# Patient Record
Sex: Female | Born: 1935 | Race: Black or African American | Hispanic: No | Smoking: Never smoker
Health system: Southern US, Community
[De-identification: ages and names within clinical notes are randomized; demographics above are authoritative.]

## PROBLEM LIST (undated history)

## (undated) DIAGNOSIS — K5790 Diverticulosis of intestine, part unspecified, without perforation or abscess without bleeding: Secondary | ICD-10-CM

## (undated) DIAGNOSIS — I872 Venous insufficiency (chronic) (peripheral): Secondary | ICD-10-CM

## (undated) DIAGNOSIS — C50919 Malignant neoplasm of unspecified site of unspecified female breast: Secondary | ICD-10-CM

## (undated) DIAGNOSIS — R51 Headache: Secondary | ICD-10-CM

## (undated) DIAGNOSIS — K589 Irritable bowel syndrome without diarrhea: Secondary | ICD-10-CM

## (undated) DIAGNOSIS — M1990 Unspecified osteoarthritis, unspecified site: Secondary | ICD-10-CM

## (undated) DIAGNOSIS — I10 Essential (primary) hypertension: Secondary | ICD-10-CM

## (undated) DIAGNOSIS — D126 Benign neoplasm of colon, unspecified: Secondary | ICD-10-CM

## (undated) DIAGNOSIS — R0902 Hypoxemia: Secondary | ICD-10-CM

## (undated) DIAGNOSIS — F419 Anxiety disorder, unspecified: Secondary | ICD-10-CM

## (undated) DIAGNOSIS — I4821 Permanent atrial fibrillation: Secondary | ICD-10-CM

## (undated) DIAGNOSIS — R7303 Prediabetes: Secondary | ICD-10-CM

## (undated) HISTORY — DX: Irritable bowel syndrome, unspecified: K58.9

## (undated) HISTORY — DX: Unspecified osteoarthritis, unspecified site: M19.90

## (undated) HISTORY — DX: Hypoxemia: R09.02

## (undated) HISTORY — PX: OTHER SURGICAL HISTORY: SHX169

## (undated) HISTORY — DX: Headache: R51

## (undated) HISTORY — DX: Malignant neoplasm of unspecified site of unspecified female breast: C50.919

## (undated) HISTORY — DX: Prediabetes: R73.03

## (undated) HISTORY — DX: Benign neoplasm of colon, unspecified: D12.6

## (undated) HISTORY — DX: Diverticulosis of intestine, part unspecified, without perforation or abscess without bleeding: K57.90

## (undated) HISTORY — PX: CATARACT EXTRACTION: SUR2

## (undated) HISTORY — PX: BREAST SURGERY: SHX581

## (undated) HISTORY — DX: Permanent atrial fibrillation: I48.21

## (undated) HISTORY — DX: Essential (primary) hypertension: I10

## (undated) HISTORY — DX: Anxiety disorder, unspecified: F41.9

## (undated) HISTORY — DX: Venous insufficiency (chronic) (peripheral): I87.2

## (undated) HISTORY — PX: CHOLECYSTECTOMY: SHX55

---

## 1994-06-11 HISTORY — PX: MASTECTOMY: SHX3

## 1999-05-10 ENCOUNTER — Encounter: Payer: Self-pay | Admitting: Emergency Medicine

## 1999-05-10 ENCOUNTER — Emergency Department (HOSPITAL_COMMUNITY): Admission: EM | Admit: 1999-05-10 | Discharge: 1999-05-10 | Payer: Self-pay | Admitting: Emergency Medicine

## 2000-05-31 ENCOUNTER — Other Ambulatory Visit: Admission: RE | Admit: 2000-05-31 | Discharge: 2000-05-31 | Payer: Self-pay | Admitting: Surgery

## 2000-06-11 HISTORY — PX: MASTECTOMY: SHX3

## 2000-06-12 ENCOUNTER — Encounter: Admission: RE | Admit: 2000-06-12 | Discharge: 2000-06-12 | Payer: Self-pay | Admitting: Surgery

## 2000-06-12 ENCOUNTER — Encounter: Payer: Self-pay | Admitting: Surgery

## 2000-06-18 ENCOUNTER — Encounter: Payer: Self-pay | Admitting: Surgery

## 2000-06-20 ENCOUNTER — Encounter: Payer: Self-pay | Admitting: Surgery

## 2000-06-20 ENCOUNTER — Inpatient Hospital Stay (HOSPITAL_COMMUNITY): Admission: RE | Admit: 2000-06-20 | Discharge: 2000-06-22 | Payer: Self-pay | Admitting: Surgery

## 2000-06-20 ENCOUNTER — Encounter (INDEPENDENT_AMBULATORY_CARE_PROVIDER_SITE_OTHER): Payer: Self-pay | Admitting: *Deleted

## 2002-06-11 HISTORY — PX: KNEE SURGERY: SHX244

## 2002-10-21 ENCOUNTER — Encounter: Payer: Self-pay | Admitting: Orthopedic Surgery

## 2002-10-26 ENCOUNTER — Inpatient Hospital Stay (HOSPITAL_COMMUNITY): Admission: RE | Admit: 2002-10-26 | Discharge: 2002-10-29 | Payer: Self-pay | Admitting: Orthopedic Surgery

## 2002-10-29 ENCOUNTER — Inpatient Hospital Stay (HOSPITAL_COMMUNITY)
Admission: RE | Admit: 2002-10-29 | Discharge: 2002-11-06 | Payer: Self-pay | Admitting: Physical Medicine & Rehabilitation

## 2002-10-29 ENCOUNTER — Encounter: Payer: Self-pay | Admitting: Physical Medicine & Rehabilitation

## 2002-11-02 ENCOUNTER — Encounter: Payer: Self-pay | Admitting: Physical Medicine & Rehabilitation

## 2002-11-10 HISTORY — PX: TOTAL KNEE ARTHROPLASTY: SHX125

## 2002-12-02 ENCOUNTER — Encounter: Admission: RE | Admit: 2002-12-02 | Discharge: 2003-02-02 | Payer: Self-pay | Admitting: Orthopedic Surgery

## 2004-04-24 ENCOUNTER — Ambulatory Visit: Payer: Self-pay

## 2004-06-11 DIAGNOSIS — D126 Benign neoplasm of colon, unspecified: Secondary | ICD-10-CM

## 2004-06-11 HISTORY — DX: Benign neoplasm of colon, unspecified: D12.6

## 2004-08-01 ENCOUNTER — Ambulatory Visit: Payer: Self-pay | Admitting: Pulmonary Disease

## 2004-11-28 ENCOUNTER — Ambulatory Visit: Payer: Self-pay | Admitting: Pulmonary Disease

## 2005-03-27 ENCOUNTER — Ambulatory Visit: Payer: Self-pay | Admitting: Pulmonary Disease

## 2005-04-05 ENCOUNTER — Ambulatory Visit: Payer: Self-pay | Admitting: Gastroenterology

## 2005-04-27 ENCOUNTER — Encounter (INDEPENDENT_AMBULATORY_CARE_PROVIDER_SITE_OTHER): Payer: Self-pay | Admitting: *Deleted

## 2005-04-27 ENCOUNTER — Ambulatory Visit: Payer: Self-pay | Admitting: Gastroenterology

## 2005-07-31 ENCOUNTER — Ambulatory Visit: Payer: Self-pay | Admitting: Pulmonary Disease

## 2005-08-28 ENCOUNTER — Ambulatory Visit: Payer: Self-pay | Admitting: Pulmonary Disease

## 2006-01-28 ENCOUNTER — Ambulatory Visit: Payer: Self-pay | Admitting: Pulmonary Disease

## 2006-04-10 ENCOUNTER — Ambulatory Visit: Payer: Self-pay | Admitting: Pulmonary Disease

## 2006-04-16 ENCOUNTER — Ambulatory Visit: Payer: Self-pay | Admitting: Pulmonary Disease

## 2006-04-23 ENCOUNTER — Ambulatory Visit: Payer: Self-pay | Admitting: Pulmonary Disease

## 2006-05-20 ENCOUNTER — Ambulatory Visit: Payer: Self-pay | Admitting: Pulmonary Disease

## 2006-08-19 ENCOUNTER — Ambulatory Visit: Payer: Self-pay | Admitting: Pulmonary Disease

## 2006-09-09 ENCOUNTER — Ambulatory Visit: Payer: Self-pay | Admitting: Pulmonary Disease

## 2006-12-10 ENCOUNTER — Ambulatory Visit: Payer: Self-pay | Admitting: Pulmonary Disease

## 2006-12-10 LAB — CONVERTED CEMR LAB
ALT: 13 units/L (ref 0–35)
Albumin: 3.7 g/dL (ref 3.5–5.2)
Alkaline Phosphatase: 71 units/L (ref 39–117)
Basophils Absolute: 0.3 10*3/uL — ABNORMAL HIGH (ref 0.0–0.1)
Basophils Relative: 3.1 % — ABNORMAL HIGH (ref 0.0–1.0)
CO2: 41 meq/L — ABNORMAL HIGH (ref 19–32)
Eosinophils Absolute: 0.3 10*3/uL (ref 0.0–0.6)
GFR calc Af Amer: 89 mL/min
HCT: 41.1 % (ref 36.0–46.0)
Hgb A1c MFr Bld: 7.1 % — ABNORMAL HIGH (ref 4.6–6.0)
Lymphocytes Relative: 16.9 % (ref 12.0–46.0)
MCHC: 32.9 g/dL (ref 30.0–36.0)
MCV: 90.6 fL (ref 78.0–100.0)
Monocytes Absolute: 0.8 10*3/uL — ABNORMAL HIGH (ref 0.2–0.7)
Monocytes Relative: 7.8 % (ref 3.0–11.0)
Neutro Abs: 7 10*3/uL (ref 1.4–7.7)
Platelets: 210 10*3/uL (ref 150–400)
RBC: 4.54 M/uL (ref 3.87–5.11)
Total CHOL/HDL Ratio: 3.8
Total Protein: 7.4 g/dL (ref 6.0–8.3)
VLDL: 24 mg/dL (ref 0–40)
WBC: 10.1 10*3/uL (ref 4.5–10.5)

## 2006-12-20 ENCOUNTER — Ambulatory Visit: Payer: Self-pay | Admitting: Pulmonary Disease

## 2006-12-21 ENCOUNTER — Ambulatory Visit (HOSPITAL_COMMUNITY): Admission: RE | Admit: 2006-12-21 | Discharge: 2006-12-21 | Payer: Self-pay | Admitting: Pulmonary Disease

## 2006-12-30 ENCOUNTER — Ambulatory Visit: Payer: Self-pay

## 2006-12-30 ENCOUNTER — Encounter: Payer: Self-pay | Admitting: Pulmonary Disease

## 2007-01-16 ENCOUNTER — Ambulatory Visit: Payer: Self-pay | Admitting: Pulmonary Disease

## 2007-01-16 LAB — CONVERTED CEMR LAB
CO2: 39 meq/L — ABNORMAL HIGH (ref 19–32)
Calcium: 9.4 mg/dL (ref 8.4–10.5)
Chloride: 96 meq/L (ref 96–112)
GFR calc Af Amer: 104 mL/min
GFR calc non Af Amer: 86 mL/min
Potassium: 3.2 meq/L — ABNORMAL LOW (ref 3.5–5.1)
Pro B Natriuretic peptide (BNP): 177 pg/mL — ABNORMAL HIGH (ref 0.0–100.0)
Sed Rate: 29 mm/hr — ABNORMAL HIGH (ref 0–25)
Sodium: 142 meq/L (ref 135–145)

## 2007-01-20 ENCOUNTER — Ambulatory Visit: Payer: Self-pay | Admitting: Pulmonary Disease

## 2007-02-19 ENCOUNTER — Ambulatory Visit: Payer: Self-pay | Admitting: Pulmonary Disease

## 2007-02-19 LAB — CONVERTED CEMR LAB
ALT: 15 units/L (ref 0–35)
BUN: 19 mg/dL (ref 6–23)
Bilirubin, Direct: 0.1 mg/dL (ref 0.0–0.3)
CO2: 35 meq/L — ABNORMAL HIGH (ref 19–32)
Calcium: 9.6 mg/dL (ref 8.4–10.5)
Creatinine, Ser: 1.1 mg/dL (ref 0.4–1.2)
GFR calc Af Amer: 62 mL/min
GFR calc non Af Amer: 51 mL/min
Glucose, Bld: 153 mg/dL — ABNORMAL HIGH (ref 70–99)
Total Protein: 6.7 g/dL (ref 6.0–8.3)

## 2007-04-08 DIAGNOSIS — K589 Irritable bowel syndrome without diarrhea: Secondary | ICD-10-CM | POA: Insufficient documentation

## 2007-04-08 DIAGNOSIS — I10 Essential (primary) hypertension: Secondary | ICD-10-CM | POA: Insufficient documentation

## 2007-04-08 DIAGNOSIS — M1990 Unspecified osteoarthritis, unspecified site: Secondary | ICD-10-CM | POA: Insufficient documentation

## 2007-04-08 DIAGNOSIS — R51 Headache: Secondary | ICD-10-CM

## 2007-04-08 DIAGNOSIS — F411 Generalized anxiety disorder: Secondary | ICD-10-CM

## 2007-04-08 DIAGNOSIS — I872 Venous insufficiency (chronic) (peripheral): Secondary | ICD-10-CM

## 2007-04-08 DIAGNOSIS — R519 Headache, unspecified: Secondary | ICD-10-CM | POA: Insufficient documentation

## 2007-04-21 ENCOUNTER — Ambulatory Visit: Payer: Self-pay | Admitting: Pulmonary Disease

## 2007-04-21 LAB — CONVERTED CEMR LAB
BUN: 14 mg/dL (ref 6–23)
CO2: 36 meq/L — ABNORMAL HIGH (ref 19–32)
Calcium: 9.5 mg/dL (ref 8.4–10.5)
Chloride: 102 meq/L (ref 96–112)
GFR calc Af Amer: 78 mL/min

## 2007-07-22 ENCOUNTER — Ambulatory Visit: Payer: Self-pay | Admitting: Pulmonary Disease

## 2007-07-22 DIAGNOSIS — K5730 Diverticulosis of large intestine without perforation or abscess without bleeding: Secondary | ICD-10-CM

## 2007-07-22 DIAGNOSIS — R0902 Hypoxemia: Secondary | ICD-10-CM | POA: Insufficient documentation

## 2007-07-22 DIAGNOSIS — R7301 Impaired fasting glucose: Secondary | ICD-10-CM

## 2007-07-30 ENCOUNTER — Telehealth: Payer: Self-pay | Admitting: Pulmonary Disease

## 2007-08-03 LAB — CONVERTED CEMR LAB
AST: 29 units/L (ref 0–37)
BUN: 15 mg/dL (ref 6–23)
Basophils Absolute: 0.1 10*3/uL (ref 0.0–0.1)
Bilirubin, Direct: 0.2 mg/dL (ref 0.0–0.3)
Calcium: 9.5 mg/dL (ref 8.4–10.5)
Chloride: 101 meq/L (ref 96–112)
Creatinine, Ser: 0.9 mg/dL (ref 0.4–1.2)
Eosinophils Absolute: 0.3 10*3/uL (ref 0.0–0.6)
Eosinophils Relative: 3.3 % (ref 0.0–5.0)
Hemoglobin: 12.7 g/dL (ref 12.0–15.0)
MCHC: 32 g/dL (ref 30.0–36.0)
MCV: 93.7 fL (ref 78.0–100.0)
Monocytes Absolute: 0.7 10*3/uL (ref 0.2–0.7)
Neutro Abs: 6.2 10*3/uL (ref 1.4–7.7)
Neutrophils Relative %: 70.7 % (ref 43.0–77.0)
Platelets: 223 10*3/uL (ref 150–400)
RDW: 13.6 % (ref 11.5–14.6)
TSH: 1.54 microintl units/mL (ref 0.35–5.50)
Total Bilirubin: 0.9 mg/dL (ref 0.3–1.2)
Total Protein: 7.2 g/dL (ref 6.0–8.3)
Vit D, 1,25-Dihydroxy: 57 (ref 30–89)

## 2007-10-28 ENCOUNTER — Ambulatory Visit: Payer: Self-pay | Admitting: Internal Medicine

## 2007-10-28 LAB — CONVERTED CEMR LAB
BUN: 19 mg/dL (ref 6–23)
GFR calc Af Amer: 62 mL/min
Potassium: 4.6 meq/L (ref 3.5–5.1)
Pro B Natriuretic peptide (BNP): 328 pg/mL — ABNORMAL HIGH (ref 0.0–100.0)
Sodium: 147 meq/L — ABNORMAL HIGH (ref 135–145)

## 2007-11-24 ENCOUNTER — Ambulatory Visit: Payer: Self-pay | Admitting: Pulmonary Disease

## 2007-11-24 DIAGNOSIS — J309 Allergic rhinitis, unspecified: Secondary | ICD-10-CM | POA: Insufficient documentation

## 2007-11-24 DIAGNOSIS — Z853 Personal history of malignant neoplasm of breast: Secondary | ICD-10-CM

## 2007-11-24 LAB — CONVERTED CEMR LAB
ALT: 23 units/L (ref 0–35)
AST: 23 units/L (ref 0–37)
Albumin: 3.9 g/dL (ref 3.5–5.2)
BUN: 12 mg/dL (ref 6–23)
Basophils Absolute: 0.1 10*3/uL (ref 0.0–0.1)
Basophils Relative: 0.9 % (ref 0.0–1.0)
CO2: 37 meq/L — ABNORMAL HIGH (ref 19–32)
Calcium: 9.6 mg/dL (ref 8.4–10.5)
Creatinine, Ser: 0.9 mg/dL (ref 0.4–1.2)
Eosinophils Relative: 3.8 % (ref 0.0–5.0)
Glucose, Bld: 101 mg/dL — ABNORMAL HIGH (ref 70–99)
HCT: 41.6 % (ref 36.0–46.0)
Hemoglobin: 13.6 g/dL (ref 12.0–15.0)
MCHC: 32.7 g/dL (ref 30.0–36.0)
Monocytes Absolute: 0.8 10*3/uL (ref 0.1–1.0)
Pro B Natriuretic peptide (BNP): 512 pg/mL — ABNORMAL HIGH (ref 0.0–100.0)
RBC: 4.39 M/uL (ref 3.87–5.11)
RDW: 14.1 % (ref 11.5–14.6)
TSH: 2.14 microintl units/mL (ref 0.35–5.50)
Total Protein: 6.7 g/dL (ref 6.0–8.3)
WBC: 10.4 10*3/uL (ref 4.5–10.5)

## 2007-11-26 ENCOUNTER — Ambulatory Visit: Payer: Self-pay | Admitting: Internal Medicine

## 2008-01-02 ENCOUNTER — Telehealth (INDEPENDENT_AMBULATORY_CARE_PROVIDER_SITE_OTHER): Payer: Self-pay | Admitting: *Deleted

## 2008-02-17 ENCOUNTER — Telehealth: Payer: Self-pay | Admitting: Pulmonary Disease

## 2008-04-14 ENCOUNTER — Telehealth (INDEPENDENT_AMBULATORY_CARE_PROVIDER_SITE_OTHER): Payer: Self-pay | Admitting: *Deleted

## 2008-04-30 ENCOUNTER — Telehealth (INDEPENDENT_AMBULATORY_CARE_PROVIDER_SITE_OTHER): Payer: Self-pay | Admitting: *Deleted

## 2008-06-07 ENCOUNTER — Ambulatory Visit: Payer: Self-pay | Admitting: Pulmonary Disease

## 2008-06-07 LAB — CONVERTED CEMR LAB
Calcium: 8.9 mg/dL (ref 8.4–10.5)
Creatinine, Ser: 0.9 mg/dL (ref 0.4–1.2)
GFR calc Af Amer: 77 mL/min
GFR calc non Af Amer: 64 mL/min
Glucose, Bld: 139 mg/dL — ABNORMAL HIGH (ref 70–99)
Hgb A1c MFr Bld: 6.1 % — ABNORMAL HIGH (ref 4.6–6.0)

## 2008-06-21 ENCOUNTER — Ambulatory Visit: Payer: Self-pay | Admitting: Gastroenterology

## 2008-07-05 ENCOUNTER — Encounter: Payer: Self-pay | Admitting: Gastroenterology

## 2008-07-05 ENCOUNTER — Ambulatory Visit: Payer: Self-pay | Admitting: Gastroenterology

## 2008-07-06 ENCOUNTER — Encounter: Payer: Self-pay | Admitting: Gastroenterology

## 2008-07-06 ENCOUNTER — Telehealth (INDEPENDENT_AMBULATORY_CARE_PROVIDER_SITE_OTHER): Payer: Self-pay

## 2008-07-08 ENCOUNTER — Telehealth (INDEPENDENT_AMBULATORY_CARE_PROVIDER_SITE_OTHER): Payer: Self-pay | Admitting: *Deleted

## 2008-07-09 ENCOUNTER — Ambulatory Visit: Payer: Self-pay | Admitting: Pulmonary Disease

## 2008-07-09 DIAGNOSIS — D126 Benign neoplasm of colon, unspecified: Secondary | ICD-10-CM | POA: Insufficient documentation

## 2008-08-03 ENCOUNTER — Ambulatory Visit: Payer: Self-pay | Admitting: Internal Medicine

## 2008-08-11 ENCOUNTER — Ambulatory Visit: Payer: Self-pay

## 2008-08-11 ENCOUNTER — Encounter: Payer: Self-pay | Admitting: Internal Medicine

## 2008-09-02 ENCOUNTER — Encounter: Payer: Self-pay | Admitting: Internal Medicine

## 2008-09-02 ENCOUNTER — Ambulatory Visit: Payer: Self-pay | Admitting: Internal Medicine

## 2008-09-02 DIAGNOSIS — I4891 Unspecified atrial fibrillation: Secondary | ICD-10-CM

## 2008-09-03 ENCOUNTER — Telehealth (INDEPENDENT_AMBULATORY_CARE_PROVIDER_SITE_OTHER): Payer: Self-pay | Admitting: *Deleted

## 2008-09-09 ENCOUNTER — Ambulatory Visit: Payer: Self-pay | Admitting: Pulmonary Disease

## 2008-09-10 LAB — CONVERTED CEMR LAB
BUN: 16 mg/dL (ref 6–23)
Calcium: 9.2 mg/dL (ref 8.4–10.5)
Chloride: 103 meq/L (ref 96–112)
Creatinine, Ser: 0.8 mg/dL (ref 0.4–1.2)
Glucose, Bld: 140 mg/dL — ABNORMAL HIGH (ref 70–99)
INR: 1.8 — ABNORMAL HIGH (ref 0.8–1.0)
Prothrombin Time: 19 s — ABNORMAL HIGH (ref 10.9–13.3)

## 2008-12-06 ENCOUNTER — Ambulatory Visit: Payer: Self-pay | Admitting: Pulmonary Disease

## 2008-12-07 ENCOUNTER — Encounter: Payer: Self-pay | Admitting: Internal Medicine

## 2008-12-07 LAB — CONVERTED CEMR LAB
ALT: 22 units/L (ref 0–35)
AST: 28 units/L (ref 0–37)
Basophils Relative: 1 % (ref 0.0–3.0)
Bilirubin, Direct: 0.2 mg/dL (ref 0.0–0.3)
Creatinine, Ser: 0.9 mg/dL (ref 0.4–1.2)
Hemoglobin: 13.8 g/dL (ref 12.0–15.0)
Lymphocytes Relative: 17 % (ref 12.0–46.0)
Lymphs Abs: 1.4 10*3/uL (ref 0.7–4.0)
Monocytes Absolute: 0.6 10*3/uL (ref 0.1–1.0)
Neutrophils Relative %: 71.9 % (ref 43.0–77.0)
POC INR: 1.4
Platelets: 191 10*3/uL (ref 150.0–400.0)
Potassium: 3.6 meq/L (ref 3.5–5.1)
Pro B Natriuretic peptide (BNP): 336 pg/mL — ABNORMAL HIGH (ref 0.0–100.0)
RBC: 4.3 M/uL (ref 3.87–5.11)
RDW: 13.8 % (ref 11.5–14.6)
TSH: 2.16 microintl units/mL (ref 0.35–5.50)
Total Bilirubin: 1.6 mg/dL — ABNORMAL HIGH (ref 0.3–1.2)
Total Protein: 7.1 g/dL (ref 6.0–8.3)

## 2008-12-09 ENCOUNTER — Telehealth (INDEPENDENT_AMBULATORY_CARE_PROVIDER_SITE_OTHER): Payer: Self-pay | Admitting: *Deleted

## 2008-12-10 ENCOUNTER — Ambulatory Visit: Payer: Self-pay | Admitting: Cardiovascular Disease

## 2008-12-10 LAB — CONVERTED CEMR LAB: POC INR: 1.3

## 2008-12-16 ENCOUNTER — Encounter: Payer: Self-pay | Admitting: Pulmonary Disease

## 2008-12-17 ENCOUNTER — Ambulatory Visit: Payer: Self-pay | Admitting: Cardiology

## 2008-12-20 ENCOUNTER — Ambulatory Visit: Payer: Self-pay | Admitting: Internal Medicine

## 2008-12-24 ENCOUNTER — Ambulatory Visit: Payer: Self-pay | Admitting: Cardiology

## 2008-12-30 ENCOUNTER — Encounter: Payer: Self-pay | Admitting: Pulmonary Disease

## 2009-01-07 ENCOUNTER — Ambulatory Visit: Payer: Self-pay | Admitting: Cardiology

## 2009-01-28 ENCOUNTER — Ambulatory Visit: Payer: Self-pay | Admitting: Cardiology

## 2009-01-28 ENCOUNTER — Encounter (INDEPENDENT_AMBULATORY_CARE_PROVIDER_SITE_OTHER): Payer: Self-pay | Admitting: Cardiology

## 2009-01-28 LAB — CONVERTED CEMR LAB: POC INR: 3.2

## 2009-01-31 ENCOUNTER — Telehealth: Payer: Self-pay | Admitting: Internal Medicine

## 2009-02-18 ENCOUNTER — Ambulatory Visit: Payer: Self-pay | Admitting: Internal Medicine

## 2009-03-08 ENCOUNTER — Ambulatory Visit: Payer: Self-pay | Admitting: Pulmonary Disease

## 2009-03-18 ENCOUNTER — Ambulatory Visit: Payer: Self-pay | Admitting: Internal Medicine

## 2009-03-20 LAB — CONVERTED CEMR LAB
BUN: 18 mg/dL (ref 6–23)
CO2: 36 meq/L — ABNORMAL HIGH (ref 19–32)
Chloride: 108 meq/L (ref 96–112)
GFR calc non Af Amer: 77.24 mL/min (ref >60)
Glucose, Bld: 93 mg/dL (ref 70–99)
Potassium: 3.7 meq/L (ref 3.5–5.1)
Prothrombin Time: 24.3 s — ABNORMAL HIGH (ref 9.1–11.7)

## 2009-04-08 ENCOUNTER — Ambulatory Visit: Payer: Self-pay | Admitting: Internal Medicine

## 2009-04-08 LAB — CONVERTED CEMR LAB: POC INR: 2.7

## 2009-04-29 ENCOUNTER — Ambulatory Visit: Payer: Self-pay | Admitting: Cardiology

## 2009-05-11 ENCOUNTER — Telehealth (INDEPENDENT_AMBULATORY_CARE_PROVIDER_SITE_OTHER): Payer: Self-pay | Admitting: *Deleted

## 2009-05-13 ENCOUNTER — Encounter: Payer: Self-pay | Admitting: Pulmonary Disease

## 2009-05-20 ENCOUNTER — Ambulatory Visit: Payer: Self-pay | Admitting: Cardiology

## 2009-05-20 LAB — CONVERTED CEMR LAB: POC INR: 3.4

## 2009-05-31 ENCOUNTER — Telehealth (INDEPENDENT_AMBULATORY_CARE_PROVIDER_SITE_OTHER): Payer: Self-pay | Admitting: *Deleted

## 2009-06-02 ENCOUNTER — Ambulatory Visit: Payer: Self-pay | Admitting: Cardiology

## 2009-06-15 ENCOUNTER — Ambulatory Visit: Payer: Self-pay | Admitting: Pulmonary Disease

## 2009-06-17 ENCOUNTER — Ambulatory Visit: Payer: Self-pay | Admitting: Cardiology

## 2009-06-17 LAB — CONVERTED CEMR LAB: POC INR: 2.1

## 2009-06-21 LAB — CONVERTED CEMR LAB
Albumin: 3.4 g/dL — ABNORMAL LOW (ref 3.5–5.2)
BUN: 17 mg/dL (ref 6–23)
Basophils Absolute: 0.1 10*3/uL (ref 0.0–0.1)
Bilirubin, Direct: 0.2 mg/dL (ref 0.0–0.3)
CO2: 35 meq/L — ABNORMAL HIGH (ref 19–32)
Calcium: 9.4 mg/dL (ref 8.4–10.5)
Chloride: 105 meq/L (ref 96–112)
Creatinine, Ser: 0.9 mg/dL (ref 0.4–1.2)
Glucose, Bld: 146 mg/dL — ABNORMAL HIGH (ref 70–99)
Hemoglobin: 11.7 g/dL — ABNORMAL LOW (ref 12.0–15.0)
Lymphs Abs: 1.4 10*3/uL (ref 0.7–4.0)
Neutrophils Relative %: 66.8 % (ref 43.0–77.0)
Platelets: 171 10*3/uL (ref 150.0–400.0)
Potassium: 3.9 meq/L (ref 3.5–5.1)
Pro B Natriuretic peptide (BNP): 488 pg/mL — ABNORMAL HIGH (ref 0.0–100.0)
RDW: 15.1 % — ABNORMAL HIGH (ref 11.5–14.6)
TSH: 1.63 microintl units/mL (ref 0.35–5.50)

## 2009-07-08 ENCOUNTER — Encounter (INDEPENDENT_AMBULATORY_CARE_PROVIDER_SITE_OTHER): Payer: Self-pay | Admitting: Cardiology

## 2009-07-08 ENCOUNTER — Ambulatory Visit: Payer: Self-pay | Admitting: Internal Medicine

## 2009-07-08 LAB — CONVERTED CEMR LAB: POC INR: 2.8

## 2009-08-05 ENCOUNTER — Ambulatory Visit: Payer: Self-pay | Admitting: Cardiology

## 2009-09-02 ENCOUNTER — Ambulatory Visit: Payer: Self-pay | Admitting: Internal Medicine

## 2009-09-02 LAB — CONVERTED CEMR LAB: POC INR: 2.6

## 2009-10-06 ENCOUNTER — Ambulatory Visit: Payer: Self-pay | Admitting: Surgery

## 2009-10-06 ENCOUNTER — Encounter (INDEPENDENT_AMBULATORY_CARE_PROVIDER_SITE_OTHER): Payer: Self-pay | Admitting: Internal Medicine

## 2009-10-06 ENCOUNTER — Inpatient Hospital Stay (HOSPITAL_COMMUNITY): Admission: EM | Admit: 2009-10-06 | Discharge: 2009-10-06 | Payer: Self-pay | Admitting: Emergency Medicine

## 2009-10-06 ENCOUNTER — Ambulatory Visit: Payer: Self-pay | Admitting: Cardiology

## 2009-10-07 ENCOUNTER — Telehealth (INDEPENDENT_AMBULATORY_CARE_PROVIDER_SITE_OTHER): Payer: Self-pay | Admitting: *Deleted

## 2009-10-12 ENCOUNTER — Telehealth (INDEPENDENT_AMBULATORY_CARE_PROVIDER_SITE_OTHER): Payer: Self-pay | Admitting: *Deleted

## 2009-10-12 ENCOUNTER — Ambulatory Visit: Payer: Self-pay | Admitting: Pulmonary Disease

## 2009-10-12 DIAGNOSIS — R5381 Other malaise: Secondary | ICD-10-CM

## 2009-10-12 DIAGNOSIS — R5383 Other fatigue: Secondary | ICD-10-CM

## 2009-10-14 ENCOUNTER — Telehealth (INDEPENDENT_AMBULATORY_CARE_PROVIDER_SITE_OTHER): Payer: Self-pay | Admitting: *Deleted

## 2009-10-15 LAB — CONVERTED CEMR LAB
BUN: 12 mg/dL (ref 6–23)
Basophils Absolute: 0 10*3/uL (ref 0.0–0.1)
Bilirubin Urine: NEGATIVE
CO2: 37 meq/L — ABNORMAL HIGH (ref 19–32)
Calcium: 9.2 mg/dL (ref 8.4–10.5)
Chloride: 102 meq/L (ref 96–112)
GFR calc non Af Amer: 88.35 mL/min (ref >60)
HCT: 36.6 % (ref 36.0–46.0)
INR: 4 — ABNORMAL HIGH (ref 0.8–1.0)
Ketones, ur: NEGATIVE mg/dL
Leukocytes, UA: NEGATIVE
Lymphs Abs: 0.7 10*3/uL (ref 0.7–4.0)
Monocytes Relative: 10.9 % (ref 3.0–12.0)
Nitrite: NEGATIVE
Prothrombin Time: 41.3 s — ABNORMAL HIGH (ref 9.1–11.7)
RBC: 3.68 M/uL — ABNORMAL LOW (ref 3.87–5.11)
Specific Gravity, Urine: 1.02 (ref 1.000–1.030)
Total Protein, Urine: NEGATIVE mg/dL
Urobilinogen, UA: 0.2 (ref 0.0–1.0)
WBC: 9.8 10*3/uL (ref 4.5–10.5)
pH: 6 (ref 5.0–8.0)

## 2009-10-17 ENCOUNTER — Ambulatory Visit: Payer: Self-pay | Admitting: Cardiology

## 2009-10-18 ENCOUNTER — Encounter: Payer: Self-pay | Admitting: Pulmonary Disease

## 2009-10-25 ENCOUNTER — Ambulatory Visit: Payer: Self-pay | Admitting: Pulmonary Disease

## 2009-10-26 ENCOUNTER — Encounter: Payer: Self-pay | Admitting: Pulmonary Disease

## 2009-10-30 LAB — CONVERTED CEMR LAB
CO2: 44 meq/L — ABNORMAL HIGH (ref 19–32)
Calcium: 9.1 mg/dL (ref 8.4–10.5)

## 2009-11-04 ENCOUNTER — Ambulatory Visit: Payer: Self-pay | Admitting: Cardiology

## 2009-11-04 LAB — CONVERTED CEMR LAB: POC INR: 3.8

## 2009-11-15 ENCOUNTER — Ambulatory Visit: Payer: Self-pay | Admitting: Pulmonary Disease

## 2009-11-16 ENCOUNTER — Encounter: Payer: Self-pay | Admitting: Pulmonary Disease

## 2009-11-18 ENCOUNTER — Ambulatory Visit: Payer: Self-pay | Admitting: Cardiovascular Disease

## 2009-11-26 ENCOUNTER — Telehealth (INDEPENDENT_AMBULATORY_CARE_PROVIDER_SITE_OTHER): Payer: Self-pay | Admitting: *Deleted

## 2009-11-29 ENCOUNTER — Telehealth (INDEPENDENT_AMBULATORY_CARE_PROVIDER_SITE_OTHER): Payer: Self-pay | Admitting: *Deleted

## 2009-12-05 ENCOUNTER — Telehealth (INDEPENDENT_AMBULATORY_CARE_PROVIDER_SITE_OTHER): Payer: Self-pay | Admitting: *Deleted

## 2009-12-09 ENCOUNTER — Ambulatory Visit: Payer: Self-pay | Admitting: Cardiology

## 2009-12-16 ENCOUNTER — Encounter: Payer: Self-pay | Admitting: Pulmonary Disease

## 2009-12-20 ENCOUNTER — Telehealth (INDEPENDENT_AMBULATORY_CARE_PROVIDER_SITE_OTHER): Payer: Self-pay | Admitting: *Deleted

## 2009-12-21 ENCOUNTER — Telehealth: Payer: Self-pay | Admitting: Pulmonary Disease

## 2010-01-03 ENCOUNTER — Telehealth: Payer: Self-pay | Admitting: Pulmonary Disease

## 2010-01-06 ENCOUNTER — Ambulatory Visit: Payer: Self-pay | Admitting: Cardiology

## 2010-01-06 ENCOUNTER — Encounter: Payer: Self-pay | Admitting: Pulmonary Disease

## 2010-01-06 LAB — CONVERTED CEMR LAB: POC INR: 2.2

## 2010-01-11 ENCOUNTER — Ambulatory Visit: Payer: Self-pay | Admitting: Pulmonary Disease

## 2010-01-12 ENCOUNTER — Encounter: Payer: Self-pay | Admitting: Pulmonary Disease

## 2010-01-12 LAB — CONVERTED CEMR LAB
BUN: 15 mg/dL (ref 6–23)
CO2: 39 meq/L — ABNORMAL HIGH (ref 19–32)
Calcium: 9.1 mg/dL (ref 8.4–10.5)
Creatinine, Ser: 0.7 mg/dL (ref 0.4–1.2)
Glucose, Bld: 96 mg/dL (ref 70–99)

## 2010-01-13 ENCOUNTER — Encounter: Payer: Self-pay | Admitting: Pulmonary Disease

## 2010-01-18 ENCOUNTER — Encounter: Payer: Self-pay | Admitting: Pulmonary Disease

## 2010-02-03 ENCOUNTER — Ambulatory Visit: Payer: Self-pay | Admitting: Cardiology

## 2010-02-06 ENCOUNTER — Encounter: Payer: Self-pay | Admitting: Pulmonary Disease

## 2010-03-02 ENCOUNTER — Ambulatory Visit: Payer: Self-pay | Admitting: Cardiology

## 2010-03-20 ENCOUNTER — Telehealth: Payer: Self-pay | Admitting: Pulmonary Disease

## 2010-04-06 ENCOUNTER — Ambulatory Visit: Payer: Self-pay | Admitting: Internal Medicine

## 2010-04-19 ENCOUNTER — Ambulatory Visit: Payer: Self-pay | Admitting: Cardiovascular Disease

## 2010-05-12 ENCOUNTER — Ambulatory Visit: Payer: Self-pay | Admitting: Pulmonary Disease

## 2010-05-19 ENCOUNTER — Ambulatory Visit: Payer: Self-pay | Admitting: Internal Medicine

## 2010-05-19 LAB — CONVERTED CEMR LAB: POC INR: 1.7

## 2010-06-09 ENCOUNTER — Ambulatory Visit: Payer: Self-pay | Admitting: Cardiology

## 2010-06-30 ENCOUNTER — Telehealth (INDEPENDENT_AMBULATORY_CARE_PROVIDER_SITE_OTHER): Payer: Self-pay | Admitting: *Deleted

## 2010-06-30 ENCOUNTER — Ambulatory Visit: Admission: RE | Admit: 2010-06-30 | Discharge: 2010-06-30 | Payer: Self-pay | Source: Home / Self Care

## 2010-07-06 ENCOUNTER — Ambulatory Visit: Admit: 2010-07-06 | Payer: Self-pay | Admitting: Pulmonary Disease

## 2010-07-07 ENCOUNTER — Telehealth: Payer: Self-pay | Admitting: Pulmonary Disease

## 2010-07-13 NOTE — Miscellaneous (Signed)
Summary: Face to face encounter/[facility]  Face to face encounter/[facility]   Imported By: Sherian Rein 11/03/2009 13:36:47  _____________________________________________________________________  External Attachment:    Type:   Image     Comment:   External Document

## 2010-07-13 NOTE — Assessment & Plan Note (Signed)
Summary: 3 months/apc   Primary Care Provider:  Alroy Dust, MD  CC:  4 month ROV & review of mult medcal problems....  History of Present Illness: 77 y/o BF here for a follow up visit... she has multiple medical problems as noted below...     ~  Dec09:  stable over the last 6 months- incr 8# in weight and has some periph edema- we discussed low sodium diet and wear support hose, etc... her sister died in [facility] 04-06-2023 & this was stressful.  ~  Jan10:  she had her 11yr f/u colonoscopy 1/10 by DrStark w/ severe diverticulosis and 4mm polyp= tubular adenoma w/ f/u rec in another 69yrs... she states that her heart was irreg on the monitoring for the colonoscopy & she she was told to f/u w/ me & made this appt... we reviewed the colon results and recs- she notes that she is asymptomatic from the cardiac standpoint- no CP, no palpit (no skipping, racing, or fluttering noted), denies dizzy/ lightheaded/ syncope, denies incr SOB or new exertional symptoms... RISKS include +FamHx, HBP, DM, LDL last check 116 (in 2008- other parameters normal)... last seen 06/07/08 and rhythm was regular on exam at that time... last EKG in 2007 showed NSR... she is not aware of irreg pulse... EKG today shows AFib- controlled VR>>> refer to Cards.  ~  Feb10:  she saw DrKlein w/ the AFib and he did:  1) 2DEcho= norm LVF w/ EF= 60% & no regional wall motion abn, mild calcif AoV w/ mild AI, mild LA & RA dil w/ incr thickness of interatrial septum c/w lipomatous hypertrophy, PA sys est at 50... 2) Holter monitor= AFib w/ rate 45-97... he stopped her Diltiazem & Indocin (due to BP)- and added PROPRANOLOL 80mg Bid & AMLODIPINE 5mg /d & COUMADIN at 5mg /d.  ~  Apr10:  ret today feeling sl better but confused about the new meds... we reviewed them w/ the pt and daughter... we will check her Protime today and set her up to see Shelby Dubin in the [facility].   ~  December 06, 2008:  somehow she never made it to the [facility]- she's been  on Coumadin 5mg /d since Feb- one Protime 09/09/08 = 19.0/ INR 1.8.Marland KitchenMarland Kitchen no bruising or bleeding complications noted... I called Shelby Dubin and will check Protime today w/ f/u in the CC as discussed... we reviewed her other meds and all correct... she notes SOB/ DOE, c/o no energy & dry mouth... O2 sat on RA= 91%, ambulating in office- 3 laps w/ HR 122 & sats dropped to 86% (similar results to prev & she had refused home O2)... ** plan>> check labs and arrange for home oxygen- 1L/min rest & 2L/min exercise.   ~  March 08, 2009:  she's been followed in the [facility]- has hematoma on upper arm w/o known trauma... saw DrKlein 7/10 for f/u AFib- he stopped Amlodipine due to edema & she reports edema decr & BP OK...   ~  June 15, 2009:  she's had a quiet 63mo- stable, no new complaints or concerns... she had her 2010 flu shot in Sep09, taking meds regularly & tol well- we will f/u labs today.    Current Problem List:  ALLERGIC RHINITIS (ICD-477.9) - on FLONASE Qhs, but c/o occas runny nose- Rx ZYRTEK QAM Prn.  Hx of HYPOXEMIA (ICD-799.02) - full eval for hypoxemia in [id number]- CXR= cardiomegaly, ectatic Ao, pulm ven HTN... CTAngio= neg, without PE, min scarring in lingula... 2DEcho= norm LVF w/ EF=55%,  LVwall thickness at upper limits, mild ca++ AoV w/ mild AI,  PA sys pressure = ... O2 sats 90-94 at rest on RA... w/ ambulation in the office sats drop to 85%... she does not yet want to go on oxygen at home... just occas SOB w/ activity- she is too sedentary and needs to incr her exercise program.  ~  6/10: c/o increased DOE & no energy- O2 sat= 91% rest & 86% w/activ... rec> home O2.  HYPERTENSION (ICD-401.9) - controlled on PROPRANOLOL 80mg Bid, QUINAPRIL 40mg /d, LASIX 40mg - 2tabsQAM, and K20Bid... DIAMOX 250mg - 2tabs daily at 4PM added to the regimen 9/08... BP=122/74 today and she tries to stay away from salt, etc...  ~  labs 2/09 showed Na143, K3.6, CL101, CO2 36, BUN 15, Cr0.9.Marland KitchenMarland Kitchen BNP was  194...  ~  labs 6/09 showed lytes normal x TCO2= 37, & BNP= 512...  ~  labs 12/09 showed HCO3- 36,  BNP= 586... rec- same meds, no salt!  ~  labs 6/10 showed Na= 146, K= 3.6, CO2= 36, BUN= 14, Creat= 0.9, BNP= 336.  ~  labs 9/10 showed Na-147, K=3.7, HCO3=36, BUN=18, Creat=0.9, BNP=484  ~  labs 1/11 showed   ATRIAL FIBRILLATION (ICD-427.31) & SICK SINUS/ TACHY-BRADY SYNDROME (ICD-427.81) - new prob 1/10 w/ eval by DrKlein- rate control strategy on Propranolol & Norvasc w/ Coumadin via the [facility].  ~  EKG 1/10 showed AFib, rate ~ 60, NSSTTWA...  ~  2DEcho 3/10 showed norm LVF w/ EF= 60% & no regional wall motion abn, mild calcif AoV w/ mild AI, mild LA & RA dil w/ incr thickness of interatrial septum c/w lipomatous hypertrophy, PA sys est at 50...  ~  Holter monitor= AFib w/ rate 45-97...  VENOUS INSUFFICIENCY (ICD-459.81) - mild persist edema despite the sodium restriction, Lasix and Diamox... reviewed recommendation for No Salt, Elevation, TED's, etc...  DIABETES MELLITUS, BORDERLINE (ICD-790.29) - on METFORMIN 500mg /d...   ~  last BS=181, HgA1c=6.4 in 11/08... BS= 142 2/09...  ~  labs 6/09 showed BS= 101, HgA1c= 6.4  ~  labs 12/09 showed BS= 139,  HgA1c= 6.1  ~  labs 6/10 showed BS= 123, A1c= 6.1  ~  7/10: neg ophthal f/u DrHecker- no retinopathy, no macular edema.  ~  labs 9/10 showed BS= 93  ~  labs 1/11 showed   DIVERTICULOSIS OF COLON, IRRITABLE BOWEL SYNDROME & COLONIC POLYPS (ICD-211.3)  ~  colonoscopy was 11/06 by DrStark showing divertics and several polyps (one 20mm adenoma)... f/u 54yrs.  ~  colonoscopy 1/10 by DrStark showed divertics (severe in sigmoid), 4mm polyp= tub adenoma... f/u 3 yrs.  BILAT BREAST CANCERS - resected via mastectomies in 1996 and 2002... followed by [facility] annually & seen 12/10.  DEGENERATIVE JOINT DISEASE (ICD-715.90) - w/ hx hyperuricemia on ALLOPURINOL 300mg /d...  Hx of HEADACHE (ICD-784.0)  ANXIETY (ICD-300.00) - on ALPRAZOLAM 0.5mg   Tid Prn...    Allergies: 1)  ! * Ivp Dye  Comments:  Nurse/Medical Assistant: The patient's medications and allergies were reviewed with the patient and were updated in the Medication and Allergy Lists.  Past History:  Past Medical History: ALLERGIC RHINITIS (ICD-477.9) Hx of HYPOXEMIA (ICD-799.02) HYPERTENSION (ICD-401.9) ATRIAL FIBRILLATION (ICD-427.31) SICK SINUS/ TACHY-BRADY SYNDROME (ICD-427.81) VENOUS INSUFFICIENCY (ICD-459.81) DIABETES MELLITUS, BORDERLINE (ICD-790.29) DIVERTICULOSIS OF COLON (ICD-562.10) IRRITABLE BOWEL SYNDROME (ICD-564.1) COLONIC POLYPS (ICD-211.3) ADENOCARCINOMA, BREAST, BILATERAL, HX OF (ICD-V10.3) DEGENERATIVE JOINT DISEASE (ICD-715.90) Hx of HEADACHE (ICD-784.0) ANXIETY (ICD-300.00)  Past Surgical History: Cataract extraction Cholecystectomy S/P right mastectomy for breast cancer in 1996 S/P left  mastectomy for cancer 1/02 by [facility] S/P Right TKR by DrRendall 6/04  Family History: Reviewed history from 11/24/2007 and no changes required. mother died at 80 of heart attack father died at 31 yrs of bleeding ulcer sister has kidney cancer brother age 21 has had open heart surgery  1 brother alive and well age 60 family history of asthma in patient's brother, and cancer in sister, daughter has breast cancer  Social History: Reviewed history from 11/24/2007 and no changes required. 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  The patient denies anorexia, fever, weight loss, weight gain, vision loss, hoarseness, chest pain, syncope, prolonged cough, headaches, hemoptysis, abdominal pain, melena, hematochezia, severe indigestion/heartburn, hematuria, incontinence, muscle weakness, suspicious skin lesions, transient blindness, depression, unusual weight  change, abnormal bleeding, enlarged lymph nodes, and angioedema.    Vital Signs:  Patient profile:   77 year old female Height:      63 inches Weight:      167.38 pounds O2 Sat:      95 % on Room air Temp:     97.5 degrees F oral Pulse rate:   61 / minute BP sitting:   122 / 74  (left arm) Cuff size:   regular  Vitals Entered By: Randell Loop CMA (June 15, 2009 2:41 PM)  O2 Sat at Rest %:  95 O2 Flow:  Room air CC: 4 month ROV & review of mult medcal problems... Comments meds updated today--pt brought all meds today   Physical Exam  Additional Exam:  WD, WN, 77 y/o BF in NAD... GENERAL:  Alert & oriented; pleasant & cooperative... HEENT:  [place]/AT, EOM-wnl, PERRLA, EACs-clear, TMs-wnl, NOSE-clear, THROAT-clear & wnl. NECK:  Supple w/ fairROM; no JVD; normal carotid impulses w/o bruits; no thyromegaly or nodules palpated; no lymphadenopathy. CHEST:  Clear to P & A; without wheezes/ rales/ or rhonchi heard... HEART:  irregular rhythm; gr 1/6 SEM without rubs or gallops appreciated... ABDOMEN:  Soft & nontender; normal bowel sounds; no organomegaly or masses detected. EXT: without deformities, mild arthritic changes; no varicose veins/ +venous insuffic/ 1+edema. NEURO:  CN's intact; no focal neuro deficits... DERM:  No lesions noted; no rash etc...     Impression & Recommendations:  Problem # 1:  HYPERTENSION (ICD-401.9) Controlled-  same meds. Her updated medication list for this problem includes:    Propranolol Hcl 80 Mg Tabs (Propranolol hcl) .Marland Kitchen... Take 1 tablet by mouth two times a day    Quinapril Hcl 40 Mg Tabs (Quinapril hcl) .Marland Kitchen... Take 1 tablet by mouth once a day    Lasix 40 Mg Tabs (Furosemide) .Marland Kitchen... Take 2 tablets once daily    Acetazolamide 250 Mg Tabs (Acetazolamide) .Marland Kitchen... Take 2 tabs by mouth once daily at 4 pm...  Orders: Venipuncture ([address]) TLB-BMP (Basic Metabolic Panel-BMET) (80048-METABOL) TLB-CBC Platelet - w/Differential  (85025-CBCD) TLB-Hepatic/Liver Function Pnl (80076-HEPATIC) TLB-TSH (Thyroid Stimulating Hormone) (84443-TSH) TLB-BNP (B-Natriuretic Peptide) (83880-BNPR) TLB-A1C / Hgb A1C (Glycohemoglobin) (83036-A1C)  Problem # 2:  ATRIAL FIBRILLATION (ICD-427.31) Stable on rate control strategy w/ Coumadin via CC... Her updated medication list for this problem includes:    Aspirin 81 Mg Tbec (Aspirin) .Marland Kitchen... Take 1 tab by mouth once daily...    Warfarin Sodium 5 Mg Tabs (Warfarin sodium) ..... Use as directed  by anticoagulation clinic    Propranolol Hcl 80 Mg Tabs (Propranolol hcl) .Marland Kitchen... Take 1 tablet by mouth two times a day  Problem # 3:  VENOUS INSUFFICIENCY (ICD-459.81) We discussed no salt, elevation, support hose, & continue the diuretics...  Problem # 4:  DIABETES MELLITUS, BORDERLINE (ICD-790.29) Stable on diet + Metformin... Her updated medication list for this problem includes:    Metformin Hcl 500 Mg Tb24 (Metformin hcl) .Marland Kitchen... Take 1 tablet by mouth once a day  Problem # 5:  IRRITABLE BOWEL SYNDROME (ICD-564.1) GI is stable-  continue same.  Problem # 6:  ADENOCARCINOMA, BREAST, BILATERAL, HX OF (ICD-V10.3) She saw [facility] in June-  stable.  Problem # 7:  DEGENERATIVE JOINT DISEASE (ICD-715.90) Stable-  advised to stay as active as poss... Her updated medication list for this problem includes:    Aspirin 81 Mg Tbec (Aspirin) .Marland Kitchen... Take 1 tab by mouth once daily...  Complete Medication List: 1)  Proair Hfa 108 (90 Base) Mcg/act Aers (Albuterol sulfate) .Marland Kitchen.. 1-2 puffs every 4-6 hours as needed 2)  Aspirin 81 Mg Tbec (Aspirin) .... Take 1 tab by mouth once daily.Marland KitchenMarland Kitchen 3)  Warfarin Sodium 5 Mg Tabs (Warfarin sodium) .... Use as directed by anticoagulation clinic 4)  Propranolol Hcl 80 Mg Tabs (Propranolol hcl) .... Take 1 tablet by mouth two times a day 5)  Quinapril Hcl 40 Mg Tabs (Quinapril hcl) .... Take 1 tablet by mouth once a day 6)  Lasix 40 Mg Tabs (Furosemide) .... Take 2  tablets once daily 7)  Klor-con M20 20 Meq Tbcr (Potassium chloride crys cr) .... Take 1 tablet by mouth two times a day 8)  Acetazolamide 250 Mg Tabs (Acetazolamide) .... Take 2 tabs by mouth once daily at 4 pm... 9)  Metformin Hcl 500 Mg Tb24 (Metformin hcl) .... Take 1 tablet by mouth once a day 10)  Allopurinol 300 Mg Tabs (Allopurinol) .... Take 1 tablet by mouth once a day 11)  Multivitamins Tabs (Multiple vitamin) .... Take 1 tablet by mouth once a day 12)  Alprazolam 0.5 Mg Tabs (Alprazolam) .... As needed 13)  Meclizine Hcl 25 Mg Tabs (Meclizine hcl) .... Take one tablet by mouth every 4 hours prn 14)  Imodium A-d 2 Mg Tabs (Loperamide hcl) .... Take 1 tablet by mouth once a day 15)  Hycodan Syrup  .Marland Kitchen.. 1 tsp every 8 hours as needed cough  Other Orders: Prescription Created Electronically [phone])  Patient Instructions: 1)  Today we updated your med list- see below.... 2)  Continue your current meds the same... 3)  Today we did your follow up blood work... please call the "phone tree" in a few days for your lab results.Marland KitchenMarland Kitchen 4)  Call for any problems.Marland KitchenMarland Kitchen 5)  Please schedule a follow-up appointment in 6 months. Prescriptions: HYCODAN SYRUP 1 tsp every 8 hours as needed cough  #240cc x 5   Entered and Authorized by:   Michele Mcalpine MD   Signed by:   Michele Mcalpine MD on 06/15/2009   Method used:   Print then Give to Patient   RxID:   [id number] QUINAPRIL HCL 40 MG  TABS (QUINAPRIL HCL) Take 1 tablet by mouth once a day  #30 x prn   Entered and Authorized by:   Michele Mcalpine MD   Signed by:   Michele Mcalpine MD on 06/15/2009   Method used:   Electronically to        [facility]  [address] [phone]* (  retail)       [address]       [place], [place]  [id number]       Ph: [phone] or [phone]       Fax: [phone]   RxID:   [id number]

## 2010-07-13 NOTE — Progress Notes (Signed)
Summary: meds questions/ HFU LMTCB x2  Phone Note Call from Patient Call back at [phone]   Caller: Patient Call For: NADEL Summary of Call: PT WAS D/C'D FROM HOSPITAL YESTERDAY. SAYS HER PAPERS SAY SHE IS TO COME IN "FRIDAY". PLEASE CALL PT AND ADVISE WHEN SHE IS TO SEE NADEL. PT ALSO HAS QUESTIONS RE: MEDS. WAS TOLD TO STOP TAKING KLOR-CON AND FUROSEMIDE.  Initial call taken by: Tivis Ringer, CNA,  October 07, 2009 3:01 PM  Follow-up for Phone Call        Spoke with pt.  She states that she needs hosp followup with SN next wk.  Nothing available.  Pt also wants SN to know she is on cipro for UTI.  Please advise when she can be seen thanks Vernie Murders  October 07, 2009 3:15 PM   Additional Follow-up for Phone Call Additional follow up Details #1::        per SN---leave the furosemide and klor con off until her follow up with SN----we can add her on 5-3 at 11 to see SN.  thanks Randell Loop CMA  Oct 10, 2009 9:39 AM  Additional Follow-up by: daughter calling back pls called her at [phone]    Additional Follow-up for Phone Call Additional follow up Details #2::    [facility] Gweneth Dimitri RN  Oct 10, 2009 9:46 AM  LMOM TCB. Boone Master CNA  Oct 11, 2009 10:01 AM   See phone note dated 10/12/2009.Michel Bickers CMA  Oct 12, 2009 10:14 AM

## 2010-07-13 NOTE — Medication Information (Signed)
Summary: rov/mw  Anticoagulant Therapy  Managed by: Leota Sauers, PharmD, BCPS, CPP Referring MD: Sherryl Manges, MD PCP: Alroy Dust, MD Supervising MD: Gala Romney MD, Reuel Boom Indication 1: Atrial Fibrillation Lab Used: [facility] [place] Site: [address] INR POC 3.5 INR RANGE 2.0 - 3.0  Dietary changes: yes       Details: Reports she had fewer greens the last few weeks bc she has been visiting family.   Health status changes: no    Bleeding/hemorrhagic complications: no    Recent/future hospitalizations: no    Any changes in medication regimen? no    Recent/future dental: no  Any missed doses?: no       Is patient compliant with meds? yes       Allergies: 1)  ! * Ivp Dye  Anticoagulation Management History:      The patient is taking warfarin and comes in today for a routine follow up visit.  Positive risk factors for bleeding include an age of 75 years or older.  The bleeding index is 'intermediate risk'.  Positive CHADS2 values include History of HTN and Age > 80 years old.  Her last INR was 4.0 ratio.  Anticoagulation responsible Janie Capp: Bensimhon MD, Reuel Boom.  INR POC: 3.5.  Cuvette Lot#: [id number].  Exp: 04/2011.    Anticoagulation Management Assessment/Plan:      The patient's current anticoagulation dose is Warfarin sodium 5 mg tabs: Use as directed by Anticoagulation Clinic.  The target INR is 2.0-3.0.  The next INR is due 04/20/2010.  Anticoagulation instructions were given to patient.  Results were reviewed/authorized by Leota Sauers, PharmD, BCPS, CPP.  She was notified by Haynes Hoehn, PharmD Candidate.         Prior Anticoagulation Instructions: INR 2.7 No changes today. Keep taking 1 tablet on wednesday and saturday. And take 1 and a half tablets all other days. See you in 4 weeks.   Current Anticoagulation Instructions: INR 3.5  Skip today's dose, then resume normal Coumadin schedule:  1.5 tablets every day of the week, except 1 tablet on  Wednesday and Saturday.  Return to clinic in 2-3 weeks.

## 2010-07-13 NOTE — Progress Notes (Signed)
Summary: feet swelling - LMTCB x1  Phone Note Call from Patient Call back at [phone]   Caller: Daughter Selena Batten Call For: nadel Summary of Call: pt 's feet swelling and she has diarrhea   Initial call taken by: Rickard Patience,  December 20, 2009 4:41 PM  Follow-up for Phone Call        pt last seen by SN 6.6.11.  LMOM TCB for Kim. Boone Master CNA/MA  December 20, 2009 5:06 PM    duplicate message---see other note. Randell Loop [facility]  December 21, 2009 11:53 AM

## 2010-07-13 NOTE — Letter (Signed)
Summary: [facility]   Imported By: Lennie Odor 02/15/2010 14:41:31  _____________________________________________________________________  External Attachment:    Type:   Image     Comment:   External Document

## 2010-07-13 NOTE — Miscellaneous (Signed)
Summary: Order/[facility]  Order/[facility]   Imported By: Sherian Rein 12/30/2009 09:50:37  _____________________________________________________________________  External Attachment:    Type:   Image     Comment:   External Document

## 2010-07-13 NOTE — Assessment & Plan Note (Signed)
Summary: ROV- OK PER LEIGH ///KP   Primary Care Cheyenne [name]:  Cheyenne Dust, MD  CC:  4 month ROV & review of mult medical problems....  History of Present Illness: 77 y/o BF here for a follow up visit... she has multiple medical problems as noted below>>>   ~  Oct 25, 2009:  she was adm 4/27-28/11 w/ confusion, weakness w/ fall (no signif trauma), ?dehydration, depresssed over the passing of her sister... she's had some intermittent SOB, not resting well & daughter does not want sleeping pills due to prev reaction... they will try Tylenol PM & consider 1/2 of the Alpraz0.5mg ... protimes monitored in the CC now... on one Lasix & K20 Qam, & one Diamox Qpm> f/u labs shows HCO3=44, BNP=588... rec> incr the Diamox to 2 Qpm...   ~  November 15, 2009:  she is improved- feeling better overall... resting better, appetite has returned, etc... weight 157# is down 10# from 1/11 and edema improved down to trace at present... BP controlled on meds;  chr AFib w/ rate control strategy & Coumadin via CC;  BS OK on diet alone...  we reviewed diuretic regimen of Lasix 40mg AM, KCl , Acetazolamide 250mg - 2tabsPM...   ~  May 12, 2010:  she continues stable over the last few months-  BP controlled, AFib w/ rate control strategy & coumadin, otherw stable & she likes the Liq Oxygen w/ incr mobility... OK Flu shot today.    Current Problem List:  ALLERGIC RHINITIS (ICD-477.9) - on FLONASE Qhs & ZYRTEK QAM Prn.  Hx of HYPOXEMIA (ICD-799.02) - full eval for hypoxemia in [id number]- CXR= cardiomegaly, ectatic Ao, pulm ven HTN... CTAngio= neg, without PE, min scarring in lingula... 2DEcho= norm LVF w/ EF=55%, LVwall thickness at upper limits, mild ca++ AoV w/ mild AI,  PA sys pressure = ... O2 sats 90-94 at rest on RA... w/ ambulation in the office sats drop to 85%... she didn't yet want to go on oxygen at home... just occas SOB w/ activity- she is too sedentary and needs to incr her exercise program.  ~  6/10: c/o  increased DOE & no energy- O2 sat= 91% rest & 86% w/activ... rec> home O2 1L/min rest, 2L/min exercise.  ~  8/11:  pt requesting change to LiqO2 to incr mobility.  HYPERTENSION (ICD-401.9) - controlled on PROPRANOLOL 80mg Bid, QUINAPRIL 40mg /d; and back on LASIX 40mg AM, KCL , & ACETAZOLAMIDE 250mg - 2tabsPM... she knows to avoid sodium etc...  ~  labs 2/09 showed Na143, K3.6, CL101, CO2 36, BUN 15, Cr0.9.Marland KitchenMarland Kitchen BNP was 194...  ~  labs 6/09 showed lytes normal x TCO2= 37, & BNP= 512...  ~  labs 12/09 showed HCO3- 36,  BNP= 586... rec- same meds, no salt!  ~  labs 6/10 showed Na= 146, K= 3.6, CO2= 36, BUN= 14, Creat= 0.9, BNP= 336.  ~  labs 9/10 showed Na-147, K=3.7, HCO3=36, BUN=18, Creat=0.9, BNP=484  ~  labs 1/11 showed Na=144, K=3.9, HCO3=35, BUN=17, Creat=0.9, BNP=488  ~  [facility] 4/11 & meds changed by TH> off diuretics & KCl.  ~  labs 10/15/09 showed Na=143, K=4.0, HCO3=37, BUN=12, Creat=0.8, BNP=761... restart Lasix, Diamox, KCl (one each).  ~  labs 10/25/09 showed HCO3=44, BNP=588... rec> incr Diamox to 2Qpm...  ~  labs 8/11 showed HCO3=39, BNP=481... continue same.  ATRIAL FIBRILLATION (ICD-427.31) & SICK SINUS/ TACHY-BRADY SYNDROME (ICD-427.81) - new prob 1/10 w/ eval by DrKlein- rate control strategy on Propranolol w/ Coumadin via the [facility].  ~  EKG 1/10 showed AFib,  rate ~ 60, NSSTTWA...  ~  2DEcho 3/10 showed norm LVF w/ EF= 60% & no regional wall motion abn, mild calcif AoV w/ mild AI, mild LA & RA dil w/ incr thickness of interatrial septum c/w lipomatous hypertrophy, PA sys est at 50...  ~  Holter monitor 3/10 = AFib w/ rate 45-97...  VENOUS INSUFFICIENCY (ICD-459.81) - mild persist edema despite the sodium restriction, Lasix and Diamox... reviewed recommendation for No Salt, Elevation, TED's, etc...  DIABETES MELLITUS, BORDERLINE (ICD-790.29) - prev on Metformin 500mg /d (held after 4/11 hosp)...  ~  last BS=181, HgA1c=6.4 in 11/08...  ~  labs 6/09 showed BS= 101, HgA1c=  6.4  ~  labs 12/09 showed BS= 139,  HgA1c= 6.1  ~  labs 6/10 showed BS= 123, A1c= 6.1  ~  7/10: neg ophthal f/u DrHecker- no retinopathy, no macular edema.  ~  labs 9/10 showed BS= 93  ~  labs 1/11 showed BS=146, A1c=5.7  ~  labs 5/11 showed BS= 100 on diet alone...  ~  labs 8/11 showed BS= 96  DIVERTICULOSIS OF COLON, IRRITABLE BOWEL SYNDROME & COLONIC POLYPS (ICD-211.3)  ~  colonoscopy was 11/06 by DrStark showing divertics and several polyps (one 20mm adenoma)... f/u 12yrs.  ~  colonoscopy 1/10 by DrStark showed divertics (severe in sigmoid), 4mm polyp= tub adenoma... f/u 3 yrs.  BILAT BREAST CANCERS - resected via mastectomies in 1996 and 2002... [facility] & seen 12/10.  DEGENERATIVE JOINT DISEASE (ICD-715.90) - w/ hx hyperuricemia on ALLOPURINOL 300mg /d...  Hx of HEADACHE (ICD-784.0)  ANXIETY (ICD-300.00) - prev rec to take Alpraz 0,5mg  Prn but she never filled the Rx.  ~  Remeron 7.5mg /d started 4/11 hosp & stopped 5/11 due to lethargy, sleepiness...   Preventive Screening-Counseling & Management  Alcohol-Tobacco     Smoking Status: never  Allergies: 1)  ! * Ivp Dye  Comments:  Nurse/Medical Assistant: The patient's medications and allergies were reviewed with the patient and were updated in the Medication and Allergy Lists.  Past History:  Past Medical History: ALLERGIC RHINITIS (ICD-477.9) Hx of HYPOXEMIA (ICD-799.02) HYPERTENSION (ICD-401.9) ATRIAL FIBRILLATION (ICD-427.31) SICK SINUS/ TACHY-BRADY SYNDROME (ICD-427.81) VENOUS INSUFFICIENCY (ICD-459.81) DIABETES MELLITUS, BORDERLINE (ICD-790.29) DIVERTICULOSIS OF COLON (ICD-562.10) IRRITABLE BOWEL SYNDROME (ICD-564.1) COLONIC POLYPS (ICD-211.3) ADENOCARCINOMA, BREAST, BILATERAL, HX OF (ICD-V10.3) DEGENERATIVE JOINT DISEASE (ICD-715.90) Hx of HEADACHE (ICD-784.0) ANXIETY (ICD-300.00)  Past Surgical History: Cataract extraction Cholecystectomy S/P right mastectomy for breast cancer in  1996 S/P left mastectomy for cancer 1/02 by [facility] S/P Right TKR by DrRendall 6/04  Family History: Reviewed history from 11/24/2007 and no changes required. mother died at 38 of heart attack father died at 44 yrs of bleeding ulcer sister has kidney cancer brother age 22 has had open heart surgery  1 brother alive and well age 46 family history of asthma in patient's brother, and cancer in sister, daughter has breast cancer  Social History: Reviewed history from 01/11/2010 and no changes required. 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  The patient denies anorexia, fever, weight loss, weight gain, vision loss, hoarseness, chest pain, syncope, prolonged cough, headaches, hemoptysis, abdominal pain, melena, hematochezia, severe indigestion/heartburn, hematuria, incontinence, suspicious skin lesions, transient blindness, depression, unusual weight change, abnormal bleeding, enlarged lymph nodes, and angioedema.  Vital Signs:  Patient profile:   77 year old female Height:      63 inches Weight:      165 pounds BMI:     29.33 O2 Sat:      96 % on Room air Temp:     98.4 degrees F oral Pulse rate:   84 / minute BP sitting:   130 / 80  (left arm) Cuff size:   regular  Vitals Entered By: Randell Loop CMA (May 12, 2010 11:10 AM)  O2 Sat at Rest %:  96 O2 Flow:  Room air CC: 4 month ROV & review of mult medical problems... Is Patient Diabetic? No Pain Assessment Patient in pain? no      Comments meds updated today with pt   Physical Exam  Additional Exam:  WD, WN, 77 y/o BF in NAD... GENERAL:  Alert & oriented; pleasant & cooperative... HEENT:  [place]/AT, EOM-wnl, PERRLA, EACs-clear, TMs-wnl, NOSE-clear, THROAT-clear & wnl. NECK:  Supple w/ fairROM; no JVD;  normal carotid impulses w/o bruits; no thyromegaly or nodules palpated; no lymphadenopathy. CHEST:  Clear to P & A; without wheezes/ rales/ or rhonchi heard... HEART:  irregular rhythm; gr 1/6 SEM without rubs or gallops appreciated... ABDOMEN:  Soft & nontender; normal bowel sounds; no organomegaly or masses detected. EXT: without deformities, mild arthritic changes; no varicose veins/ +venous insuffic/ 1-2+edema. NEURO:  CN's intact; no focal neuro deficits... DERM:  No lesions noted; no rash etc...    Impression & Recommendations:  Problem # 1:  Hx of HYPOXEMIA (ICD-799.02) Stable on the Liq O2>  continue 1L/ min rest, 2L/ min exercise...  Problem # 2:  HYPERTENSION (ICD-401.9) Controlled>  same meds. Her updated medication list for this problem includes:    Propranolol Hcl 80 Mg Tabs (Propranolol hcl) .Marland Kitchen... Take 1  tablet by mouth two times a day    Quinapril Hcl 40 Mg Tabs (Quinapril hcl) .Marland Kitchen... Take 1 tablet by mouth once a day    Lasix 40 Mg Tabs (Furosemide) .Marland Kitchen... Take 1 tablet once daily in am...    Acetazolamide 250 Mg Tabs (Acetazolamide) .Marland Kitchen... Take 2 tabs by mouth once daily at 4 pm...  Problem # 3:  ATRIAL FIBRILLATION (ICD-427.31) Stable chr AFib on Coumadin & rate control strategy... Her updated medication list for this problem includes:    Aspirin 81 Mg Tbec (Aspirin) .Marland Kitchen... Take 1 tab by mouth once daily...    Warfarin Sodium 5 Mg Tabs (Warfarin sodium) ..... Use as directed by anticoagulation clinic    Propranolol Hcl 80 Mg Tabs (Propranolol hcl) .Marland Kitchen... Take 1  tablet by mouth two times a day  Problem # 4:  VENOUS INSUFFICIENCY (ICD-459.81) Continue low sodium + diuretic therapy...  Problem # 5:  GI>>> Stable & up to date...  Problem # 6:  ADENOCARCINOMA, BREAST, BILATERAL, HX OF (ICD-V10.3) Stable>  no known recurrence...  Problem # 7:  ANXIETY (ICD-300.00) She does not want Rx for anxiolytic med... The following medications were removed from the medication  list:    Alprazolam 0.5 Mg Tabs (Alprazolam) .Marland Kitchen... As needed  Problem # 8:  OTHER MEDICAL PROBLEMS AS NOTED>>> OK Flu vaccine today.  Complete Medication List: 1)  Proair Hfa 108 (90 Base) Mcg/act Aers (Albuterol sulfate) .Marland Kitchen.. 1-2 puffs every 4-6 hours as needed 2)  Aspirin 81 Mg Tbec (Aspirin) .... Take 1 tab by mouth once daily.Marland KitchenMarland Kitchen 3)  Warfarin Sodium 5 Mg Tabs (Warfarin sodium) .... Use as directed by anticoagulation clinic 4)  Propranolol Hcl 80 Mg Tabs (Propranolol hcl) .... Take 1  tablet by mouth two times a day 5)  Quinapril Hcl 40 Mg Tabs (Quinapril hcl) .... Take 1 tablet by mouth once a day 6)  Lasix 40 Mg Tabs (Furosemide) .... Take 1 tablet once daily in am... 7)  Acetazolamide 250 Mg Tabs (Acetazolamide) .... Take 2 tabs by mouth once daily at 4 pm... 8)  Klor-con M20 20 Meq Tbcr (Potassium chloride crys cr) .... Take 1 tab by mouth once daily.Marland KitchenMarland Kitchen 9)  Allopurinol 300 Mg Tabs (Allopurinol) .... Take 1 tablet by mouth once a day 10)  Multivitamins Tabs (Multiple vitamin) .... Take 1 tablet by mouth once a day 11)  Meclizine Hcl 25 Mg Tabs (Meclizine hcl) .... Take one tablet by mouth every 4 hours prn  Patient Instructions: 1)  Today we updated your med list- see below.... 2)  Continue your current meds the same... 3)  Today we gave you the 2011 Flu vaccine... 4)  Call for any problems.Marland KitchenMarland Kitchen 5)  Please schedule a follow-up appointment in 4-6 months.

## 2010-07-13 NOTE — Medication Information (Signed)
Summary: rov/tm  Anticoagulant Therapy  Managed by: Weston Brass, PharmD Referring MD: Sherryl Manges, MD PCP: Alroy Dust, MD Supervising MD: Antoine Poche MD, Fayrene Fearing Indication 1: Atrial Fibrillation Lab Used: [facility] [place] Site: [address] INR POC 2.2 INR RANGE 2.0 - 3.0  Dietary changes: no    Health status changes: yes       Details: Complained of dizziness last Thursday.   Bleeding/hemorrhagic complications: no    Recent/future hospitalizations: no    Any changes in medication regimen? no    Recent/future dental: no  Any missed doses?: no       Is patient compliant with meds? yes       Allergies: 1)  ! * Ivp Dye  Anticoagulation Management History:      The patient is taking warfarin and comes in today for a routine follow up visit.  Positive risk factors for bleeding include an age of 75 years or older.  The bleeding index is 'intermediate risk'.  Positive CHADS2 values include History of HTN and Age > 75 years old.  Her last INR was 4.0 ratio.  Anticoagulation responsible Rikki Trosper: Antoine Poche MD, Fayrene Fearing.  INR POC: 2.2.  Cuvette Lot#: [id number].  Exp: 03/2011.    Anticoagulation Management Assessment/Plan:      The patient's current anticoagulation dose is Warfarin sodium 5 mg tabs: Use as directed by Anticoagulation Clinic.  The target INR is 2.0-3.0.  The next INR is due 03/03/2010.  Anticoagulation instructions were given to patient.  Results were reviewed/authorized by Weston Brass, PharmD.  She was notified by Gweneth Fritter, PharmD Candidate.         Prior Anticoagulation Instructions: INR 2.2 Continue 7.5mg s everyday except 5mg s on Wednesdays and Saturdays. Recheck in 4 weeks.   Current Anticoagulation Instructions: INR 2.2  Continue taking 1.5 tablets (7.5mg ) every day except take 1 tablet (5mg ) on Wednesdays and Saturdays.  Recheck in 4 weeks.

## 2010-07-13 NOTE — Medication Information (Signed)
Summary: Cheyenne [name]  Anticoagulant Therapy  Managed by: Bethena Midget, RN, BSN Referring MD: Sherryl Manges, MD PCP: Alroy Dust, MD Supervising MD: Antoine Poche MD, Fayrene Fearing Indication 1: Atrial Fibrillation Lab Used: [facility] [place] Site: [facility] INR POC 2.1 INR RANGE 2.0 - 3.0  Dietary changes: no    Health status changes: no    Bleeding/hemorrhagic complications: no    Recent/future hospitalizations: no    Any changes in medication regimen? yes       Details: Using Hydrocodone syrup 1 teaspoon q8hrs Prn.   Recent/future dental: no  Any missed doses?: no       Is patient compliant with meds? yes       Allergies: 1)  ! * Ivp Dye  Anticoagulation Management History:      The patient is taking warfarin and comes in today for a routine follow up visit.  Positive risk factors for bleeding include an age of 75 years or older.  The bleeding index is 'intermediate risk'.  Positive CHADS2 values include History of HTN and Age > 21 years old.  Her last INR was 2.4 ratio.  Anticoagulation responsible provider: Antoine Poche MD, Fayrene Fearing.  INR POC: 2.1.  Cuvette Lot#: [id number].  Exp: 07/2010.    Anticoagulation Management Assessment/Plan:      The patient's current anticoagulation dose is Warfarin sodium 5 mg tabs: Use as directed by Anticoagulation Clinic.  The target INR is 2.0-3.0.  The next INR is due 07/08/2009.  Anticoagulation instructions were given to patient.  Results were reviewed/authorized by Bethena Midget, RN, BSN.  She was notified by Bethena Midget, RN, BSN.         Prior Anticoagulation Instructions: INR 2.9 Change dose to 1.5 tablets everyday except 1 tablet on Wednesdays and Saturdays. Recheck INR 2-3 weeks.   Current Anticoagulation Instructions: INR 2.1 Continue 7.5mg s daily except 5mg s on Wednesdays and Saturdays.  Recheck in 3 weeks.

## 2010-07-13 NOTE — Progress Notes (Signed)
Summary: talk to nurse  Phone Note Call from Patient Call back at [phone]  or [phone]   Caller: Daughter/kim Call For: nadel Reason for Call: Talk to Nurse Summary of Call: Pt c/o breathing difficulties, shaky, unstable in walking lack of energy, swollen right eye, she has gotten progressively worse since last Wednesday, wants to be seen today,pls advise. Initial call taken by: Darletta Moll,  Oct 12, 2009 9:13 AM  Follow-up for Phone Call        Daughter is wanting to bring pt in to see SN today. She did not get previous msg for appt on 10/11/2009 because our office tried to contact the patients home and the pt is staying with her daughter Selena Batten. Pt does not want an appt with TP. Please advise.Michel Bickers CMA  Oct 12, 2009 10:16 AM  Additional Follow-up for Phone Call Additional follow up Details #1::        pt here in the office now with daughter for appt.  added to SN schedule. Randell Loop CMA  Oct 12, 2009 12:37 PM      Appended Document: talk to nurse called and spoke with  kim---pts daughter---she is aware that per SN no coumadin over the weekend.  i called the coumadin clinic and the did have one appt for today at 2:15 and morning appts for 5-10---called to get her daughter to call and set up a time to have protime rechecked---she stated that pt is doing so much better and wanted SN to know this and that pt has appt today at 3:15 with Dr. Graciela Husbands.

## 2010-07-13 NOTE — Medication Information (Signed)
Summary: Cheyenne [name]  Anticoagulant Therapy  Managed by: Eda Keys, PharmD Referring MD: Sherryl Manges, MD PCP: Alroy Dust, MD Supervising MD: Jens Som MD, Arlys John Indication 1: Atrial Fibrillation Lab Used: [facility] [place] Site: [facility] INR POC 2.3 INR RANGE 2.0 - 3.0  Dietary changes: no    Health status changes: no    Bleeding/hemorrhagic complications: no    Recent/future hospitalizations: no    Any changes in medication regimen? no    Recent/future dental: no  Any missed doses?: no       Is patient compliant with meds? yes       Allergies: 1)  ! * Ivp Dye  Anticoagulation Management History:      The patient is taking warfarin and comes in today for a routine follow up visit.  Positive risk factors for bleeding include an age of 75 years or older.  The bleeding index is 'intermediate risk'.  Positive CHADS2 values include History of HTN and Age > 56 years old.  Her last INR was 2.4 ratio.  Anticoagulation responsible provider: Jens Som MD, Arlys John.  INR POC: 2.3.  Cuvette Lot#: [id number].  Exp: 10/2010.    Anticoagulation Management Assessment/Plan:      The patient's current anticoagulation dose is Warfarin sodium 5 mg tabs: Use as directed by Anticoagulation Clinic.  The target INR is 2.0-3.0.  The next INR is due 09/02/2009.  Anticoagulation instructions were given to patient.  Results were reviewed/authorized by Eda Keys, PharmD.  She was notified by Eda Keys.         Prior Anticoagulation Instructions: INR 2.8  Continue 1.5 tabs daily except 1 tab each Wednesday and Saturday.  Recheck in 4 weeks.    Current Anticoagulation Instructions: INR 2.3  Continue 1.5 tablets every day, except take 1 tablet on Wednesday and Saturday.  Return to clinic in 4 weeks.

## 2010-07-13 NOTE — Medication Information (Signed)
Summary: rov/cs/ appt is 1:30/ gd  Anticoagulant Therapy  Managed by: Reina Fuse, PharmD Referring MD: Sherryl Manges, MD PCP: Alroy Dust, MD Supervising MD: Eden Emms MD, Theron Arista Indication 1: Atrial Fibrillation Lab Used: [facility] [place] Site: [address] INR POC 2.6 INR RANGE 2.0 - 3.0  Dietary changes: no    Health status changes: no    Bleeding/hemorrhagic complications: no    Recent/future hospitalizations: no    Any changes in medication regimen? no    Recent/future dental: no  Any missed doses?: no       Is patient compliant with meds? yes       Allergies: 1)  ! * Ivp Dye  Anticoagulation Management History:      The patient is taking warfarin and comes in today for a routine follow up visit.  Positive risk factors for bleeding include an age of 75 years or older.  The bleeding index is 'intermediate risk'.  Positive CHADS2 values include History of HTN and Age > 45 years old.  Her last INR was 4.0 ratio.  Anticoagulation responsible Caliph [place]: Eden Emms MD, Theron Arista.  INR POC: 2.6.  Cuvette Lot#: [id number].  Exp: 04/2011.    Anticoagulation Management Assessment/Plan:      The patient's current anticoagulation dose is Warfarin sodium 5 mg tabs: Use as directed by Anticoagulation Clinic.  The target INR is 2.0-3.0.  The next INR is due 05/19/2010.  Anticoagulation instructions were given to patient.  Results were reviewed/authorized by Reina Fuse, PharmD.  She was notified by Reina Fuse PharmD.         Prior Anticoagulation Instructions: INR 3.5  Skip today's dose, then resume normal Coumadin schedule:  1.5 tablets every day of the week, except 1 tablet on Wednesday and Saturday.  Return to clinic in 2-3 weeks.    Current Anticoagulation Instructions: INR 2.6  Continue taking Coumadin 1.5 tabs (7.5 mg) on all days except for Coumadin 1 tab (5 mg) on Wednesdays and Saturdays. Return to clinic in 4 weeks.

## 2010-07-13 NOTE — Medication Information (Signed)
Summary: rov  js  Anticoagulant Therapy  Managed by: Weston Brass, PharmD Referring MD: Sherryl Manges, MD PCP: Alroy Dust, MD Supervising MD: Clifton James MD, Cristal Deer Indication 1: Atrial Fibrillation Lab Used: [facility] [place] Site: [address] INR POC 2.6 INR RANGE 2.0 - 3.0  Dietary changes: no    Health status changes: no    Bleeding/hemorrhagic complications: no    Recent/future hospitalizations: no    Any changes in medication regimen? yes       Details: adjusting diuretics and potassium over past month  Recent/future dental: no  Any missed doses?: no       Is patient compliant with meds? yes       Allergies: 1)  ! * Ivp Dye  Anticoagulation Management History:      The patient is taking warfarin and comes in today for a routine follow up visit.  Positive risk factors for bleeding include an age of 75 years or older.  The bleeding index is 'intermediate risk'.  Positive CHADS2 values include History of HTN and Age > 75 years old.  Her last INR was 4.0 ratio.  Anticoagulation responsible provider: Clifton James MD, Cristal Deer.  INR POC: 2.6.  Cuvette Lot#: [id number].  Exp: 01/2011.    Anticoagulation Management Assessment/Plan:      The patient's current anticoagulation dose is Warfarin sodium 5 mg tabs: Use as directed by Anticoagulation Clinic.  The target INR is 2.0-3.0.  The next INR is due 12/09/2009.  Anticoagulation instructions were given to patient.  Results were reviewed/authorized by Weston Brass, PharmD.  She was notified by Weston Brass PharmD.         Prior Anticoagulation Instructions: Hold Saturday's dose, and on Sunday take only a half of a tablet.  Then resume dosing: one and a half tablets every day except one tablet on Wednesdays and Saturdays.  Current Anticoagulation Instructions: INR 2.6  Continue same dose of 1 1/2 tablets every day except 1 tablet on Wednesday and Saturday

## 2010-07-13 NOTE — Progress Notes (Signed)
Summary: med ? - LMTCB x3  Phone Note Call from Patient   Caller: Daughter kim Call For: nadel Summary of Call: have ? about pt's medication is she suppose to be taking metformin Initial call taken by: Rickard Patience,  November 29, 2009 11:38 AM  Follow-up for Phone Call        this is not on pt's med list or in any of sn's notes, why is she asking if pt should be on this? does pt have some or does she have rx for this---lmtcb   Philipp Deputy [facility]  November 29, 2009 11:41 AM   per SN's recs for the 11-26-09 phone msg forwarded to him by Merry Proud: continue off the metformin and control w/ diet.  ATC kim, LMOM TCB. Boone Master CNA/MA  November 29, 2009 3:56 PM   [facility] for Kim TCB Vernie Murders  November 30, 2009 11:46 AM   Additional Follow-up for Phone Call Additional follow up Details #1::        ATC kim @ number left in 11-26-09 phone note - [phone] .  LMOM TCB. Boone Master CNA/MA  November 30, 2009 3:44 PM    lmomtcb Randell Loop [facility]  December 01, 2009 5:02 PM     Additional Follow-up for Phone Call Additional follow up Details #2::    I ATC pt's daughter Selena Batten again- had to leave a msg again.  I advised that this is our 5th attempt to reach her and if still has questions needs to please call out office back.  I also called the pt's home number and [facility] for pt to call if has any problems/questions. Follow-up by: Vernie Murders,  December 02, 2009 11:12 AM

## 2010-07-13 NOTE — Medication Information (Signed)
Summary: rov/sp  Anticoagulant Therapy  Managed by: Bethena Midget, RN, BSN Referring MD: Sherryl Manges, MD PCP: Alroy Dust, MD Supervising MD: Jens Som MD, Arlys John Indication 1: Atrial Fibrillation Lab Used: [facility] [place] Site: [address] INR POC 2.2 INR RANGE 2.0 - 3.0  Dietary changes: no    Health status changes: no    Bleeding/hemorrhagic complications: no    Recent/future hospitalizations: no    Any changes in medication regimen? no    Recent/future dental: no  Any missed doses?: no       Is patient compliant with meds? yes       Allergies: 1)  ! * Ivp Dye  Anticoagulation Management History:      The patient is taking warfarin and comes in today for a routine follow up visit.  Positive risk factors for bleeding include an age of 75 years or older.  The bleeding index is 'intermediate risk'.  Positive CHADS2 values include History of HTN and Age > 11 years old.  Her last INR was 4.0 ratio.  Anticoagulation responsible provider: Jens Som MD, Arlys John.  INR POC: 2.2.  Cuvette Lot#: [id number].  Exp: 03/2011.    Anticoagulation Management Assessment/Plan:      The patient's current anticoagulation dose is Warfarin sodium 5 mg tabs: Use as directed by Anticoagulation Clinic.  The target INR is 2.0-3.0.  The next INR is due 02/03/2010.  Anticoagulation instructions were given to patient.  Results were reviewed/authorized by Bethena Midget, RN, BSN.  She was notified by Bethena Midget, RN, BSN.         Prior Anticoagulation Instructions: INR 2.7  Continue same dose of 1 1/2 tablets every day except 1 tablet on Wednesday and Saturday.   Current Anticoagulation Instructions: INR 2.2 Continue 7.5mg s everyday except 5mg s on Wednesdays and Saturdays. Recheck in 4 weeks.

## 2010-07-13 NOTE — Letter (Signed)
Summary: Handout Printed  Printed Handout:  - Coumadin Instructions-w/out Meds 

## 2010-07-13 NOTE — Progress Notes (Signed)
Summary: order - LMTCB x1  Phone Note Call from Patient Call back at [phone]   Caller: Patient Call For: nadel Summary of Call: pt need portable oxygen tank Initial call taken by: Rickard Patience,  Oct 12, 2009 2:11 PM  Follow-up for Phone Call        Pt just seen today.  Is this okay to send order for portable o2? Please advise, thanks Vernie Murders  Oct 12, 2009 2:36 PM   order placed in EMR per SN.  LMOM TCB. Boone Master CNA  Oct 12, 2009 2:50 PM   Additional Follow-up for Phone Call Additional follow up Details #1::        Pt's daughter aware that order has been sent to [facility].  I gave her the number there per her request so that she could advise them when and where to send o2. Additional Follow-up by: Vernie Murders,  Oct 12, 2009 3:05 PM

## 2010-07-13 NOTE — Medication Information (Signed)
Summary: rov/tm  Anticoagulant Therapy  Managed by: Cloyde Reams, RN, BSN Referring MD: Sherryl Manges, MD PCP: Alroy Dust, MD Supervising MD: Jens Som MD, Arlys John Indication 1: Atrial Fibrillation Lab Used: [facility] [place] Site: [address] INR POC 1.8 INR RANGE 2.0 - 3.0  Dietary changes: no    Health status changes: no    Bleeding/hemorrhagic complications: no    Recent/future hospitalizations: no    Any changes in medication regimen? no    Recent/future dental: no  Any missed doses?: no       Is patient compliant with meds? yes       Allergies: 1)  ! * Ivp Dye  Anticoagulation Management History:      The patient is taking warfarin and comes in today for a routine follow up visit.  Positive risk factors for bleeding include an age of 39 years or older.  The bleeding index is 'intermediate risk'.  Positive CHADS2 values include History of HTN and Age > 65 years old.  Her last INR was 4.0 ratio.  Anticoagulation responsible provider: Jens Som MD, Arlys John.  INR POC: 1.8.  Cuvette Lot#: [id number].  Exp: 03/2011.    Anticoagulation Management Assessment/Plan:      The patient's current anticoagulation dose is Warfarin sodium 5 mg tabs: Use as directed by Anticoagulation Clinic.  The target INR is 2.0-3.0.  The next INR is due 06/29/2010.  Anticoagulation instructions were given to patient.  Results were reviewed/authorized by Cloyde Reams, RN, BSN.  She was notified by Cloyde Reams RN.         Prior Anticoagulation Instructions: INR 1.7 Today take 2 pills then resume 1.5 pills everyday except 1 pill on Wednesdays and Saturdays. Recheck in 3 weeks.   Current Anticoagulation Instructions: INR 1.8   Take 2 tablets today, then start taking 1.5 tablets daily except 1 tablet on Wednesdays.  Recheck in 3 weeks.

## 2010-07-13 NOTE — Medication Information (Signed)
Summary: rov/eac  Anticoagulant Therapy  Managed by: Cloyde Reams, RN, BSN Referring MD: Sherryl Manges, MD PCP: Alroy Dust, MD Supervising MD: Tenny Craw MD, Gunnar Fusi Indication 1: Atrial Fibrillation Lab Used: [facility] [place] Site: [facility] [employer] INR POC 2.6 INR RANGE 2.0 - 3.0  Dietary changes: no    Health status changes: no    Bleeding/hemorrhagic complications: no    Recent/future hospitalizations: no    Any changes in medication regimen? no    Recent/future dental: no  Any missed doses?: no       Is patient compliant with meds? yes       Allergies (verified): 1)  ! * Ivp Dye  Anticoagulation Management History:      The patient is taking warfarin and comes in today for a routine follow up visit.  Positive risk factors for bleeding include an age of 75 years or older.  The bleeding index is 'intermediate risk'.  Positive CHADS2 values include History of HTN and Age > 9 years old.  Her last INR was 2.4 ratio.  Anticoagulation responsible provider: Tenny Craw MD, Gunnar Fusi.  INR POC: 2.6.  Cuvette Lot#: [id number].  Exp: 10/2010.    Anticoagulation Management Assessment/Plan:      The patient's current anticoagulation dose is Warfarin sodium 5 mg tabs: Use as directed by Anticoagulation Clinic.  The target INR is 2.0-3.0.  The next INR is due 10/07/2009.  Anticoagulation instructions were given to patient.  Results were reviewed/authorized by Cloyde Reams, RN, BSN.  She was notified by Cloyde Reams RN.         Prior Anticoagulation Instructions: INR 2.3  Continue 1.5 tablets every day, except take 1 tablet on Wednesday and Saturday.  Return to clinic in 4 weeks.    Current Anticoagulation Instructions: INR 2.6  Continue on same dosage 1.5 tablets daily except 1 tablet on Wednesdays and Saturdays.  Recheck in 4 weeks.

## 2010-07-13 NOTE — Progress Notes (Signed)
Summary: Nurse visits/monitor o2 sats  Phone Note From Other Clinic Call back at [phone]/cell  [phone]   Caller: almira//[facility] Call For: nadel Summary of Call: Cheyenne [name] states she will be seeing pt 2x weekly for 5 wks. She needs an order for nurse eval, also says pt is currently using her O2 continously at 4 liters per minute since her d/c from hospital last week. Initial call taken by: Darletta Moll,  Oct 14, 2009 3:06 PM  Follow-up for Phone Call        Cheyenne [name] will be seeing the pt twice a week x 5 weeks. She is requesting an order for nurse eval. She also wanted to make SN aware that when she checked the pt her oxygen was set on 4 liters continuously and she wants to know if this is what the pt is supposed to be on? Please advise. Carron Curie CMA  Oct 14, 2009 3:14 PM   Additional Follow-up for Phone Call Additional follow up Details #1::        per SN---ok for nuurse eval---monitor o2 sats---oxgyen at 2-4 liters to keeps sats about 90.  thanks Randell Loop CMA  Oct 14, 2009 3:36 PM   LMTCB. Carron Curie CMA  Oct 14, 2009 3:47 PM     Additional Follow-up for Phone Call Additional follow up Details #2::    almira returned call from triage. Tivis Ringer, CNA  Oct 14, 2009 3:56 PM  Verbal order given to [facility] and she knows to monitor O2 sats on 2-4 liters to keep sats above 90%. Follow-up by: Michel Bickers CMA,  Oct 14, 2009 4:03 PM

## 2010-07-13 NOTE — Medication Information (Signed)
Summary: rov/tp  Anticoagulant Therapy  Managed by: Geoffry Paradise, PharmD Referring MD: Sherryl Manges, MD PCP: Alroy Dust, MD Supervising MD: Shirlee Latch MD, Shayden Bobier Indication 1: Atrial Fibrillation Lab Used: [facility] [place] Site: [address] INR POC 2.4 INR RANGE 2.0 - 3.0  Dietary changes: no    Health status changes: no    Bleeding/hemorrhagic complications: no    Recent/future hospitalizations: no    Any changes in medication regimen? no    Recent/future dental: no  Any missed doses?: no       Is patient compliant with meds? yes       Allergies: 1)  ! * Ivp Dye  Anticoagulation Management History:      The patient is taking warfarin and comes in today for a routine follow up visit.  Positive risk factors for bleeding include an age of 41 years or older.  The bleeding index is 'intermediate risk'.  Positive CHADS2 values include History of HTN and Age > 71 years old.  Her last INR was 4.0 ratio.  Anticoagulation responsible provider: Shirlee Latch MD, Kiylah Loyer.  INR POC: 2.4.  Cuvette Lot#: [id number].  Exp: 06/2011.    Anticoagulation Management Assessment/Plan:      The patient's current anticoagulation dose is Warfarin sodium 5 mg tabs: Use as directed by Anticoagulation Clinic.  The target INR is 2.0-3.0.  The next INR is due 07/28/2010.  Anticoagulation instructions were given to patient.  Results were reviewed/authorized by Geoffry Paradise, PharmD.         Prior Anticoagulation Instructions: INR 1.8   Take 2 tablets today, then start taking 1.5 tablets daily except 1 tablet on Wednesdays.  Recheck in 3 weeks.   Current Anticoagulation Instructions: INR: 2.4 (goal 2-3)  Your INR is at goal today.  Continue taking 7.5 mg every day EXCEPT for Wednesdays.  Return in 4 weeks for another INR check.

## 2010-07-13 NOTE — Medication Information (Signed)
Summary: rov/jk  Anticoagulant Therapy  Managed by: Lyna Poser, PharmD Referring MD: Sherryl Manges, MD PCP: Alroy Dust, MD Supervising MD: Shirlee Latch MD,Dalton Indication 1: Atrial Fibrillation Lab Used: [facility] [place] Site: [address] INR POC 2.7 INR RANGE 2.0 - 3.0  Dietary changes: no    Health status changes: no    Bleeding/hemorrhagic complications: no    Recent/future hospitalizations: no    Any changes in medication regimen? no    Recent/future dental: no  Any missed doses?: no       Is patient compliant with meds? yes       Allergies: 1)  ! * Ivp Dye  Anticoagulation Management History:      The patient is taking warfarin and comes in today for a routine follow up visit.  Positive risk factors for bleeding include an age of 75 years or older.  The bleeding index is 'intermediate risk'.  Positive CHADS2 values include History of HTN and Age > 68 years old.  Her last INR was 4.0 ratio.  Anticoagulation responsible provider: Shirlee Latch MD,Dalton.  INR POC: 2.7.  Cuvette Lot#: [id number].  Exp: 03/2011.    Anticoagulation Management Assessment/Plan:      The patient's current anticoagulation dose is Warfarin sodium 5 mg tabs: Use as directed by Anticoagulation Clinic.  The target INR is 2.0-3.0.  The next INR is due 03/30/2010.  Anticoagulation instructions were given to patient.  Results were reviewed/authorized by Lyna Poser, PharmD.  She was notified by Lyna Poser PharmD.         Prior Anticoagulation Instructions: INR 2.2  Continue taking 1.5 tablets (7.5mg ) every day except take 1 tablet (5mg ) on Wednesdays and Saturdays.  Recheck in 4 weeks.   Current Anticoagulation Instructions: INR 2.7 No changes today. Keep taking 1 tablet on wednesday and saturday. And take 1 and a half tablets all other days. See you in 4 weeks.

## 2010-07-13 NOTE — Letter (Signed)
Summary: [facility]  [facility]N/[facility]   Imported By: Lester Glenmont 01/25/2010 08:33:23  _____________________________________________________________________  External Attachment:    Type:   Image     Comment:   External Document

## 2010-07-13 NOTE — Assessment & Plan Note (Signed)
Summary: 3 week follow up/la   Primary Care Provider:  Alroy Dust, MD  CC:  3 week ROV & review....  History of Present Illness: 77 y/o Cheyenne [name] here for a follow up visit... she has multiple medical problems as noted below...     ~  June 15, 2009:  she's had a quiet 100mo- stable, no new complaints or concerns... she had her 2010 flu shot in Sep10, taking meds regularly & tol well- we will f/u labs today.   ~  Oct 15, 2009:  She was adm 4/27-28/11 w/ confusion, weakness w/ fall (no signif trauma), ?dehydration- she was somewhat depresssed over the passing of her sister w/ poor intake, & UTI treated w/ fluids & Cipro (diuretics held as well)... MEDS REVIEWED> & adjustments made, see below...  since disch she continues weak w/ mult somatic complaints> we decided to restart her diuretic regimen & check f/u labs/ urine, and refer for home therapy...   ~  Oct 25, 2009:  she's had some intermittent SOB, not resting well & daughter does not want sleeping pills due to prev reaction... they will try Tylenol PM & consider 1/2 of the Alpraz0.5mg ... protimes monitored in the CC now... on one Lasix & K20 Qam, & one Diamox Qpm> f/u labs shows HCO3=44, BNP=588... rec> incr the Diamox to 2 Qpm...   ~  November 15, 2009:  she is improved- feeling better overall... resting better, appetite has returned, etc... weight 157# is down 10# from 1/11 ans edema improved down to trace at present... BP controlled on meds;  chr AFib w/ rate control strategy & Coumadin via CC;  BS OK on diet alone...  we reviewed diuretic regimen of Lasix 40mg AM, KCl , Acetazolamide 250mg - 2tabsPM...   Current Problem List:  ALLERGIC RHINITIS (ICD-477.9) - on FLONASE Qhs & ZYRTEK QAM Prn.  Hx of HYPOXEMIA (ICD-799.02) - full eval for hypoxemia in [id number]- CXR= cardiomegaly, ectatic Ao, pulm ven HTN... CTAngio= neg, without PE, min scarring in lingula... 2DEcho= norm LVF w/ EF=55%, LVwall thickness at upper limits, mild ca++ AoV w/ mild  AI,  PA sys pressure = ... O2 sats 90-94 at rest on RA... w/ ambulation in the office sats drop to 85%... she didn't yet want to go on oxygen at home... just occas SOB w/ activity- she is too sedentary and needs to incr her exercise program.  ~  6/10: c/o increased DOE & no energy- O2 sat= 91% rest & 86% w/activ... rec> home O2 1L/min rest, 2L/min exercise.  HYPERTENSION (ICD-401.9) - controlled on PROPRANOLOL 80mg Bid, QUINAPRIL 40mg /d; and back on LASIX 40mg AM, KCL , & ACETAZOLAMIDE 250mg - 2tabsPM... she knows to avoid sodium etc...  ~  labs 2/09 showed Na143, K3.6, CL101, CO2 36, BUN 15, Cr0.9.Marland KitchenMarland Kitchen BNP was 194...  ~  labs 6/09 showed lytes normal x TCO2= 37, & BNP= 512...  ~  labs 12/09 showed HCO3- 36,  BNP= 586... rec- same meds, no salt!  ~  labs 6/10 showed Na= 146, K= 3.6, CO2= 36, BUN= 14, Creat= 0.9, BNP= 336.  ~  labs 9/10 showed Na-147, K=3.7, HCO3=36, BUN=18, Creat=0.9, BNP=484  ~  labs 1/11 showed Na=144, K=3.9, HCO3=35, BUN=17, Creat=0.9, BNP=488  ~  [facility] 4/11 & meds changed by TH> off diuretics & KCl.  ~  labs 10/15/09 showed Na=143, K=4.0, HCO3=37, BUN=12, Creat=0.8, BNP=761... restart Lasix, Diamox, KCl (one each).  ~  labs 10/25/09 showed HCO3=44, BNP=588... rec> incr Diamox to 2Qpm...  ATRIAL FIBRILLATION (ICD-427.31) &  SICK SINUS/ TACHY-BRADY SYNDROME (ICD-427.81) - new prob 1/10 w/ eval by DrKlein- rate control strategy on Propranolol & Norvasc w/ Coumadin via [facility].  ~  EKG 1/10 showed AFib, rate ~ 60, NSSTTWA...  ~  2DEcho 3/10 showed norm LVF w/ EF= 60% & no regional wall motion abn, mild calcif AoV w/ mild AI, mild LA & RA dil w/ incr thickness of interatrial septum c/w lipomatous hypertrophy, PA sys est at 50...  ~  Holter monitor 3/10 = AFib w/ rate 45-97...  VENOUS INSUFFICIENCY (ICD-459.81) - mild persist edema despite the sodium restriction, Lasix and Diamox... reviewed recommendation for No Salt, Elevation, TED's, etc...  DIABETES MELLITUS,  BORDERLINE (ICD-790.29) - prev on Metformin 500mg /d (held after 4/11 hosp)...  ~  last BS=181, HgA1c=6.4 in 11/08...  ~  labs 6/09 showed BS= 101, HgA1c= 6.4  ~  labs 12/09 showed BS= 139,  HgA1c= 6.1  ~  labs 6/10 showed BS= 123, A1c= 6.1  ~  7/10: neg ophthal f/u DrHecker- no retinopathy, no macular edema.  ~  labs 9/10 showed BS= 93  ~  labs 1/11 showed BS=146, A1c=5.7  ~  labs 5/11 showed BS= 100 on diet alone...  DIVERTICULOSIS OF COLON, IRRITABLE BOWEL SYNDROME & COLONIC POLYPS (ICD-211.3)  ~  colonoscopy was 11/06 by DrStark showing divertics and several polyps (one 20mm adenoma)... f/u 43yrs.  ~  colonoscopy 1/10 by DrStark showed divertics (severe in sigmoid), 4mm polyp= tub adenoma... f/u 3 yrs.  BILAT BREAST CANCERS - resected via mastectomies in 1996 and 2002... followed by [facility] annually & seen 12/10.  DEGENERATIVE JOINT DISEASE (ICD-715.90) - w/ hx hyperuricemia on ALLOPURINOL 300mg /d...  Hx of HEADACHE (ICD-784.0)  ANXIETY (ICD-300.00) - on ALPRAZOLAM 0.5mg  Tid Prn...  ~  Remeron 7.5mg /d started 4/11 hosp & stopped 5/11 due to lethargy, sleepiness...   Preventive Screening-Counseling & Management  Alcohol-Tobacco     Smoking Status: never  Allergies: 1)  ! * Ivp Dye  Comments:  Nurse/Medical Assistant: The patient's medications and allergies were reviewed with the patient and were updated in the Medication and Allergy Lists.  Past History:  Past Medical History: ALLERGIC RHINITIS (ICD-477.9) Hx of HYPOXEMIA (ICD-799.02) HYPERTENSION (ICD-401.9) ATRIAL FIBRILLATION (ICD-427.31) SICK SINUS/ TACHY-BRADY SYNDROME (ICD-427.81) VENOUS INSUFFICIENCY (ICD-459.81) DIABETES MELLITUS, BORDERLINE (ICD-790.29) DIVERTICULOSIS OF COLON (ICD-562.10) IRRITABLE BOWEL SYNDROME (ICD-564.1) COLONIC POLYPS (ICD-211.3) ADENOCARCINOMA, BREAST, BILATERAL, HX OF (ICD-V10.3) DEGENERATIVE JOINT DISEASE (ICD-715.90) Hx of HEADACHE (ICD-784.0) ANXIETY (ICD-300.00)  Past  Surgical History: Cataract extraction Cholecystectomy S/P right mastectomy for breast cancer in 1996 S/P left mastectomy for cancer 1/02 by [facility] S/P Right TKR by DrRendall 6/04  Family History: Reviewed history from 11/24/2007 and no changes required. mother died at 1 of heart attack father died at 25 yrs of bleeding ulcer sister has kidney cancer brother age 86 has had open heart surgery  1 brother alive and well age 81 family history of asthma in patient's brother, and cancer in sister, daughter has breast cancer  Social History: Reviewed history from 11/24/2007 and no changes required. 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  The patient denies anorexia, fever, weight loss, weight gain, vision loss, hoarseness, chest pain, syncope, prolonged cough, headaches, hemoptysis, abdominal pain, melena, hematochezia, severe indigestion/heartburn, hematuria, incontinence, suspicious skin lesions,  transient blindness, depression, unusual weight change, abnormal bleeding, enlarged lymph nodes, and angioedema.    Vital Signs:  Patient profile:   77 year old female Height:      63 inches Weight:      157 pounds BMI:     27.91 O2 Sat:      97 % on 2 L/min Temp:     99.2 degrees F oral Pulse rate:   86 / minute BP sitting:   130 / 90  (left arm) Cuff size:   regular  Vitals Entered By: Randell Loop CMA (November 15, 2009 3:39 PM)  O2 Sat at Rest %:  97 O2 Flow:  2 L/min CC: 3 week ROV & review... Is Patient Diabetic? Yes Pain Assessment Patient in pain? no      Comments no changes in meds today---has questions.   Physical Exam  Additional Exam:  WD, WN, 77 y/o Cheyenne [name] in NAD... GENERAL:  Alert & oriented; pleasant & cooperative... HEENT:  [place]/AT, EOM-wnl, PERRLA,  EACs-clear, TMs-wnl, NOSE-clear, THROAT-clear & wnl. NECK:  Supple w/ fairROM; no JVD; normal carotid impulses w/o bruits; no thyromegaly or nodules palpated; no lymphadenopathy. CHEST:  Clear to P & A; without wheezes/ rales/ or rhonchi heard... HEART:  irregular rhythm; gr 1/6 SEM without rubs or gallops appreciated... ABDOMEN:  Soft & nontender; normal bowel sounds; no organomegaly or masses detected. EXT: without deformities, mild arthritic changes; no varicose veins/ +venous insuffic/ 1+edema. NEURO:  CN's intact; no focal neuro deficits... DERM:  No lesions noted; no rash etc...    Impression & Recommendations:  Problem # 1:  Hx of HYPOXEMIA (ICD-799.02) She was a non-smoker w/ second hand smoke exposure... no signif pulm hx w/o asthma etc... she was hypoxemic w/ desat during exercise  & started on oxygen... improved w/ diuresis & improvement in BNP (diificult to diurese w/ elev bicarb level & Diamox helped)...  Problem # 2:  HYPERTENSION (ICD-401.9) Controlled on meds>  continue same for now... Her updated medication list for this problem includes:    Propranolol Hcl 80 Mg Tabs (Propranolol hcl) .Marland Kitchen... Take 1/2 tablet by mouth two times a day    Quinapril Hcl 40 Mg Tabs (Quinapril hcl) .Marland Kitchen... Take 1 tablet by mouth once a day    Lasix 40 Mg Tabs (Furosemide) .Marland Kitchen... Take 1 tablet once daily in am...    Acetazolamide 250 Mg Tabs (Acetazolamide) .Marland Kitchen... Take 2 tabs by mouth once daily at 4 pm...  Problem # 3:  ATRIAL FIBRILLATION (ICD-427.31) Followed by DrKlein on rate control + Coumadin via CC>  stable. Her updated medication list for this problem includes:    Aspirin 81 Mg Tbec (Aspirin) .Marland Kitchen... Take 1 tab by mouth once daily...    Warfarin Sodium 5 Mg Tabs (Warfarin sodium) ..... Use as directed by anticoagulation clinic    Propranolol Hcl 80 Mg Tabs (Propranolol hcl) .Marland Kitchen... Take 1/2 tablet by mouth two times a day  Problem # 4:  VENOUS INSUFFICIENCY (ICD-459.81) Edema improved w/  diuresis...  Problem # 5:  DIABETES MELLITUS, BORDERLINE (ICD-790.29) On diet alone for now... last BS= 159 in May.  Problem # 6:  DIVERTICULOSIS OF COLON (ICD-562.10) GI is stable & followed by DrStark...  Problem # 7:  ADENOCARCINOMA, BREAST, BILATERAL, HX OF (ICD-V10.3) Followed by [facility] & stable w/o known recurrence...  Problem # 8:  ANXIETY (ICD-300.00) Stable on the Prn Alprazolam... Her updated medication list for this problem includes:    Alprazolam 0.5 Mg Tabs (Alprazolam) .Marland KitchenMarland KitchenMarland KitchenMarland Kitchen  As needed  Complete Medication List: 1)  Proair Hfa 108 (90 Base) Mcg/act Aers (Albuterol sulfate) .Marland Kitchen.. 1-2 puffs every 4-6 hours as needed 2)  Aspirin 81 Mg Tbec (Aspirin) .... Take 1 tab by mouth once daily.Marland KitchenMarland Kitchen 3)  Warfarin Sodium 5 Mg Tabs (Warfarin sodium) .... Use as directed by anticoagulation clinic 4)  Propranolol Hcl 80 Mg Tabs (Propranolol hcl) .... Take 1/2 tablet by mouth two times a day 5)  Quinapril Hcl 40 Mg Tabs (Quinapril hcl) .... Take 1 tablet by mouth once a day 6)  Lasix 40 Mg Tabs (Furosemide) .... Take 1 tablet once daily in am... 7)  Acetazolamide 250 Mg Tabs (Acetazolamide) .... Take 2 tabs by mouth once daily at 4 pm... 8)  Klor-con M20 20 Meq Tbcr (Potassium chloride crys cr) .... Take 1 tab by mouth once daily.Marland KitchenMarland Kitchen 9)  Allopurinol 300 Mg Tabs (Allopurinol) .... Take 1 tablet by mouth once a day 10)  Multivitamins Tabs (Multiple vitamin) .... Take 1 tablet by mouth once a day 11)  Alprazolam 0.5 Mg Tabs (Alprazolam) .... As needed 12)  Meclizine Hcl 25 Mg Tabs (Meclizine hcl) .... Take one tablet by mouth every 4 hours prn 13)  Hycodan Syrup  .Marland Kitchen.. 1 tsp every 8 hours as needed cough  Patient Instructions: 1)  Today we updated your med list- see below.... 2)  We decided to increase the Diamox/ ACETAZOLAMIDE to 2 tabs daily at 4PM..Marland Kitchen 3)  Also please resume the Potassium KCl one tab daily.Marland KitchenMarland Kitchen 4)  Call for any questions.Marland KitchenMarland Kitchen 5)  Let's plan a follow up visit in 2  months... Prescriptions: ACETAZOLAMIDE 250 MG TABS (ACETAZOLAMIDE) take 2 tabs by mouth once daily at 4 PM...  #60 x 11   Entered and Authorized by:   Michele Mcalpine MD   Signed by:   Michele Mcalpine MD on 11/15/2009   Method used:   Print then Give to Patient   RxID:   [id number]

## 2010-07-13 NOTE — Miscellaneous (Signed)
Summary: Episode Summary Report / [facility]  Episode Summary Report / [facility]   Imported By: Lennie Odor 03/02/2010 11:23:13  _____________________________________________________________________  External Attachment:    Type:   Image     Comment:   External Document

## 2010-07-13 NOTE — Medication Information (Signed)
Summary: rov/sp  Anticoagulant Therapy  Managed by: Weston Brass, PharmD Referring MD: Sherryl Manges, MD PCP: Alroy Dust, MD Supervising MD: Juanda Chance MD, Berea Majkowski Indication 1: Atrial Fibrillation Lab Used: [facility] [place] Site: [address] INR POC 2.7 INR RANGE 2.0 - 3.0  Dietary changes: no    Health status changes: no    Bleeding/hemorrhagic complications: no    Recent/future hospitalizations: no    Any changes in medication regimen? no    Recent/future dental: no  Any missed doses?: no       Is patient compliant with meds? yes       Allergies: 1)  ! * Ivp Dye  Anticoagulation Management History:      The patient is taking warfarin and comes in today for a routine follow up visit.  Positive risk factors for bleeding include an age of 45 years or older.  The bleeding index is 'intermediate risk'.  Positive CHADS2 values include History of HTN and Age > 41 years old.  Her last INR was 4.0 ratio.  Anticoagulation responsible provider: Juanda Chance MD, Smitty Cords.  INR POC: 2.7.  Cuvette Lot#: [id number].  Exp: 01/2011.    Anticoagulation Management Assessment/Plan:      The patient's current anticoagulation dose is Warfarin sodium 5 mg tabs: Use as directed by Anticoagulation Clinic.  The target INR is 2.0-3.0.  The next INR is due 01/06/2010.  Anticoagulation instructions were given to patient.  Results were reviewed/authorized by Weston Brass, PharmD.  She was notified by Weston Brass PharmD.         Prior Anticoagulation Instructions: INR 2.6  Continue same dose of 1 1/2 tablets every day except 1 tablet on Wednesday and Saturday  Current Anticoagulation Instructions: INR 2.7  Continue same dose of 1 1/2 tablets every day except 1 tablet on Wednesday and Saturday.

## 2010-07-13 NOTE — Progress Notes (Signed)
Summary: oxygen  Phone Note Call from Patient Call back at Home Phone [phone]   Caller: Daughter Call For: Cheyenne [name] Reason for Call: Talk to Nurse Summary of Call: pt says she wants to go on liquid oxygen due to fact that it is lighter than regular oxygen  .  Spoke to SN at last visit about this.  Thru [facility].  [facility] doesn't carry portable. Initial call taken by: Eugene Gavia,  January 03, 2010 1:48 PM  Follow-up for Phone Call        TP---pt is requesting to change to liquid oxygen.  please advise if i can call this in as a verbal order.  thanks Randell Loop CMA  January 03, 2010 2:26 PM   Additional Follow-up for Phone Call Additional follow up Details #1::        ok  Additional Follow-up by: Rubye Oaks NP,  January 03, 2010 2:35 PM    Additional Follow-up for Phone Call Additional follow up Details #2::    called and spoke with pt and she is aware of order being sent to [facility] for the liquid oxgen Randell Loop CMA  January 03, 2010 2:39 PM

## 2010-07-13 NOTE — Miscellaneous (Signed)
Summary: Plan/[facility]  Plan/[facility]   Imported By: Lester Clearview 01/25/2010 08:32:15  _____________________________________________________________________  External Attachment:    Type:   Image     Comment:   External Document

## 2010-07-13 NOTE — Medication Information (Signed)
Summary: rov/eac  Anticoagulant Therapy  Managed by: Minerva Areola, RN Referring MD: Sherryl Manges, MD PCP: Alroy Dust, MD Supervising MD: Myrtis Ser MD, Tinnie Gens Indication 1: Atrial Fibrillation Lab Used: [facility] [place] Site: [address] INR POC 3.8 INR RANGE 2.0 - 3.0  Dietary changes: no    Health status changes: no    Bleeding/hemorrhagic complications: no    Recent/future hospitalizations: no    Any changes in medication regimen? no       Details: decrease in furosemide dose  Recent/future dental: no  Any missed doses?: no         Current Medications (verified): 1)  Proair Hfa 108 (90 Base) Mcg/act  Aers (Albuterol Sulfate) .Marland Kitchen.. 1-2 Puffs Every 4-6 Hours As Needed 2)  Aspirin 81 Mg Tbec (Aspirin) .... Take 1 Tab By Mouth Once Daily.Marland KitchenMarland Kitchen 3)  Warfarin Sodium 5 Mg Tabs (Warfarin Sodium) .... Use As Directed By Anticoagulation Clinic 4)  Propranolol Hcl 80 Mg  Tabs (Propranolol Hcl) .... Take 1/2 Tablet By Mouth Two Times A Day 5)  Quinapril Hcl 40 Mg  Tabs (Quinapril Hcl) .... Take 1 Tablet By Mouth Once A Day 6)  Lasix 40 Mg  Tabs (Furosemide) .... Take 1 Tablet Once Daily in Am... 7)  Acetazolamide 250 Mg Tabs (Acetazolamide) .... Take 1 Tab By Mouth Once Daily At 4 Pm... 8)  Klor-Con M20 20 Meq  Tbcr (Potassium Chloride Crys Cr) .... ***  Hold  *** 9)  Allopurinol 300 Mg  Tabs (Allopurinol) .... Take 1 Tablet By Mouth Once A Day 10)  Multivitamins   Tabs (Multiple Vitamin) .... Take 1 Tablet By Mouth Once A Day 11)  Alprazolam 0.5 Mg  Tabs (Alprazolam) .... As Needed 12)  Meclizine Hcl 25 Mg  Tabs (Meclizine Hcl) .... Take One Tablet By Mouth Every 4 Hours Prn 13)  Hycodan Syrup .Marland Kitchen.. 1 Tsp Every 8 Hours As Needed Cough  Allergies (verified): 1)  ! * Ivp Dye  Anticoagulation Management History:      The patient is taking warfarin and comes in today for a routine follow up visit.  Positive risk factors for bleeding include an age of 75 years or  older.  The bleeding index is 'intermediate risk'.  Positive CHADS2 values include History of HTN and Age > 20 years old.  Her last INR was 4.0 ratio.  Anticoagulation responsible provider: Myrtis Ser MD, Tinnie Gens.  INR POC: 3.8.  Cuvette Lot#: [id number].  Exp: 01/2011.    Anticoagulation Management Assessment/Plan:      The patient's current anticoagulation dose is Warfarin sodium 5 mg tabs: Use as directed by Anticoagulation Clinic.  The target INR is 2.0-3.0.  The next INR is due 11/18/2009.  Anticoagulation instructions were given to patient.  Results were reviewed/authorized by Minerva Areola, RN.  She was notified by Weston Brass PharmD.         Prior Anticoagulation Instructions: INR 2.0  Continue taking 1.5 tablets everyday, except take 1 tablet on Wednesday and Saturday.  Return to clinic in 3 weeks.    Current Anticoagulation Instructions: Hold Saturday's dose, and on Sunday take only a half of a tablet.  Then resume dosing: one and a half tablets every day except one tablet on Wednesdays and Saturdays.

## 2010-07-13 NOTE — Medication Information (Signed)
Summary: rov/sl  Anticoagulant Therapy  Managed by: Bethena Midget, RN, BSN Referring MD: Sherryl Manges, MD PCP: Alroy Dust, MD Supervising MD: Johney Frame MD, Fayrene Fearing Indication 1: Atrial Fibrillation Lab Used: [facility] [place] Site: [address] INR POC 1.7 INR RANGE 2.0 - 3.0  Dietary changes: no    Health status changes: no    Bleeding/hemorrhagic complications: no    Recent/future hospitalizations: no    Any changes in medication regimen? no    Recent/future dental: no  Any missed doses?: no       Is patient compliant with meds? yes       Allergies: 1)  ! * Ivp Dye  Anticoagulation Management History:      The patient is taking warfarin and comes in today for a routine follow up visit.  Positive risk factors for bleeding include an age of 75 years or older.  The bleeding index is 'intermediate risk'.  Positive CHADS2 values include History of HTN and Age > 13 years old.  Her last INR was 4.0 ratio.  Anticoagulation responsible Gargi Berch: Allred MD, Fayrene Fearing.  INR POC: 1.7.  Cuvette Lot#: [id number].  Exp: 03/2011.    Anticoagulation Management Assessment/Plan:      The patient's current anticoagulation dose is Warfarin sodium 5 mg tabs: Use as directed by Anticoagulation Clinic.  The target INR is 2.0-3.0.  The next INR is due 06/09/2010.  Anticoagulation instructions were given to patient.  Results were reviewed/authorized by Bethena Midget, RN, BSN.  She was notified by Bethena Midget, RN, BSN.         Prior Anticoagulation Instructions: INR 2.6  Continue taking Coumadin 1.5 tabs (7.5 mg) on all days except for Coumadin 1 tab (5 mg) on Wednesdays and Saturdays. Return to clinic in 4 weeks.   Current Anticoagulation Instructions: INR 1.7 Today take 2 pills then resume 1.5 pills everyday except 1 pill on Wednesdays and Saturdays. Recheck in 3 weeks.

## 2010-07-13 NOTE — Progress Notes (Signed)
  Phone Note Call from Patient   Caller: Daughter Action Taken: Phone Call Completed Summary of Call: Daughter Clement Husbands called [phone] asking about metformin.  Reports she noticed that it was not on the patients list of meds since last visit.  In looking back at documentation in EMR -med appears to have been held since 4/11 post hospital discharge.  Discussed symptoms of elevated blood sugar with daughter to include excessive thirst, urination or AMS.  Encouraged to report to ED with any concerns as they are going on a trip for the week.  Daughter was instructed not to give metformin and to call on Monday for phone follow up with Dr. Kriste Basque.   Initial call taken by: Canary Brim NP-C,  November 26, 2009 1:31 PM     Appended Document:  per SN---leave her off of the metformin for now

## 2010-07-13 NOTE — Miscellaneous (Signed)
Summary: Plan of Care & Treatment/[facility]  Plan of Care & Treatment/[facility]   Imported By: Sherian Rein 10/26/2009 14:26:49  _____________________________________________________________________  External Attachment:    Type:   Image     Comment:   External Document

## 2010-07-13 NOTE — Medication Information (Signed)
Summary: rov/tm  Anticoagulant Therapy  Managed by: Shelby Dubin, PharmD, BCPS, CPP Referring MD: Sherryl Manges, MD PCP: Alroy Dust, MD Supervising MD: Antoine Poche MD, Fayrene Fearing Indication 1: Atrial Fibrillation Lab Used: [facility] [place] Site: [employer] INR POC 2.8 INR RANGE 2.0 - 3.0  Dietary changes: no    Health status changes: no    Bleeding/hemorrhagic complications: no    Recent/future hospitalizations: no    Any changes in medication regimen? no    Recent/future dental: no  Any missed doses?: no       Is patient compliant with meds? yes       Allergies (verified): 1)  ! * Ivp Dye  Anticoagulation Management History:      The patient is taking warfarin and comes in today for a routine follow up visit.  Positive risk factors for bleeding include an age of 75 years or older.  The bleeding index is 'intermediate risk'.  Positive CHADS2 values include History of HTN and Age > 22 years old.  Her last INR was 2.4 ratio.  Anticoagulation responsible provider: Antoine Poche MD, Fayrene Fearing.  INR POC: 2.8.  Cuvette Lot#: [id number].  Exp: 07/2010.    Anticoagulation Management Assessment/Plan:      The patient's current anticoagulation dose is Warfarin sodium 5 mg tabs: Use as directed by Anticoagulation Clinic.  The target INR is 2.0-3.0.  The next INR is due 08/05/2009.  Anticoagulation instructions were given to patient.  Results were reviewed/authorized by Shelby Dubin, PharmD, BCPS, CPP.  She was notified by Shelby Dubin PharmD, BCPS, CPP.         Prior Anticoagulation Instructions: INR 2.1 Continue 7.5mg s daily except 5mg s on Wednesdays and Saturdays.  Recheck in 3 weeks.   Current Anticoagulation Instructions: INR 2.8  Continue 1.5 tabs daily except 1 tab each Wednesday and Saturday.  Recheck in 4 weeks.

## 2010-07-13 NOTE — Assessment & Plan Note (Signed)
Summary: 2 week follow up/la   Primary Care Provider:  Alroy Dust, MD  CC:  2 week follow up--still unable to sleep at night with no appetite--sob off and on since last ov.  History of Present Illness: 77 y/o BF here for a follow up visit... she has multiple medical problems as noted below...     ~  Dec09:  stable over the last 6 months- incr 8# in weight and has some periph edema- we discussed low sodium diet and wear support hose, etc... her sister died in [facility] 09-Apr-2023 & this was stressful.  ~  Jan10:  she had her 70yr f/u colonoscopy 1/10 by DrStark w/ severe diverticulosis and 4mm polyp= tubular adenoma w/ f/u rec in another 66yrs... she states that her heart was irreg on the monitoring for the colonoscopy & she she was told to f/u w/ me & made this appt... we reviewed the colon results and recs- she notes that she is asymptomatic from the cardiac standpoint- no CP, no palpit (no skipping, racing, or fluttering noted), denies dizzy/ lightheaded/ syncope, denies incr SOB or new exertional symptoms... RISKS include +FamHx, HBP, DM, LDL last check 116 (in 2008- other parameters normal)... last seen 06/07/08 and rhythm was regular on exam at that time... last EKG in 2007 showed NSR... she is not aware of irreg pulse... EKG today shows AFib- controlled VR>>> refer to Cards.  ~  Feb10:  she saw DrKlein w/ the AFib and he did:  1) 2DEcho= norm LVF w/ EF= 60% & no regional wall motion abn, mild calcif AoV w/ mild AI, mild LA & RA dil w/ incr thickness of interatrial septum c/w lipomatous hypertrophy, PA sys est at 50... 2) Holter monitor= AFib w/ rate 45-97... he stopped her Diltiazem & Indocin (due to BP)- and added PROPRANOLOL 80mg Bid & AMLODIPINE 5mg /d & COUMADIN at 5mg /d.  ~  Apr10:  ret today feeling sl better but confused about the new meds... we reviewed them w/ the pt and daughter... we will check her Protime today and set her up to see Shelby Dubin in the [facility].  ~  Jun10:  somehow she  never made it to the [facility]- she's been on Coumadin 5mg /d since Feb- one Protime 09/09/08 = 19.0/ INR 1.8.Marland KitchenMarland Kitchen no bruising or bleeding complications noted... I called Shelby Dubin and will check Protime today w/ f/u in the CC as discussed... we reviewed her other meds and all correct... she notes SOB/ DOE, c/o no energy & dry mouth... O2 sat on RA= 91%, ambulating in office- 3 laps w/ HR 122 & sats dropped to 86% (similar results to prev & she had refused home O2)... ** plan>> check labs and arrange for home oxygen- 1L/min rest & 2L/min exercise.  ~  Sep10:  she's been followed in the [facility]- has hematoma on upper arm w/o known trauma... saw DrKlein 7/10 for f/u AFib- he stopped Amlodipine due to edema & she reports edema decr & BP OK...   ~  June 15, 2009:  she's had a quiet 21mo- stable, no new complaints or concerns... she had her 2010 flu shot in Sep09, taking meds regularly & tol well- we will f/u labs today.   ~  Oct 15, 2009:  She was adm 4/27-28/11 w/ confusion, weakness w/ fall (no signif trauma), ?dehydration- she was somewhat depresssed over the passing of her sister w/ poor intake, & UTI treated w/ fluids & Cipro (diuretics held as well)... MEDS REVIEWED> & adjustments  made, see below...  since disch she continues weak w/ mult somatic complaints> we decided to restart her diuretic regimen & check f/u labs/ urine, and refer for home therapy...   ~  Oct 25, 2009:  she's had some intermittent SOB, not resting well & daughter does not want sleeping pills due to prev reaction... they will try Tylenol PM & consider 1/2 of the Alpraz0.5mg ... protimes monitored in the CC now... on one Lasix & K20 Qam, & one Diamox Qpm> f/u labs shows HCO3=44, BNP=588... rec> incr the Diamox to 2 Qpm...    Current Problem List:  ALLERGIC RHINITIS (ICD-477.9) - on FLONASE Qhs, but c/o occas runny nose- Rx [id number] QAM Prn.  Hx of HYPOXEMIA (ICD-799.02) - full eval for hypoxemia in [id number]- CXR=  cardiomegaly, ectatic Ao, pulm ven HTN... CTAngio= neg, without PE, min scarring in lingula... 2DEcho= norm LVF w/ EF=55%, LVwall thickness at upper limits, mild ca++ AoV w/ mild AI,  PA sys pressure = ... O2 sats 90-94 at rest on RA... w/ ambulation in the office sats drop to 85%... she does not yet want to go on oxygen at home... just occas SOB w/ activity- she is too sedentary and needs to incr her exercise program.  ~  6/10: c/o increased DOE & no energy- O2 sat= 91% rest & 86% w/activ... rec> home O2.  HYPERTENSION (ICD-401.9) - controlled on PROPRANOLOL 80mg Bid, QUINAPRIL 40mg /d, off LASIX & K20 now... on DIAMOX 250mg  daily at 4PM... BP=124/82 today and she tries to stay away from salt, etc...  ~  labs 2/09 showed Na143, K3.6, CL101, CO2 36, BUN 15, Cr0.9.Marland KitchenMarland Kitchen BNP was 194...  ~  labs 6/09 showed lytes normal x TCO2= 37, & BNP= 512...  ~  labs 12/09 showed HCO3- 36,  BNP= 586... rec- same meds, no salt!  ~  labs 6/10 showed Na= 146, K= 3.6, CO2= 36, BUN= 14, Creat= 0.9, BNP= 336.  ~  labs 9/10 showed Na-147, K=3.7, HCO3=36, BUN=18, Creat=0.9, BNP=484  ~  labs 1/11 showed Na=144, K=3.9, HCO3=35, BUN=17, Creat=0.9, BNP=488  ~  [facility] 4/11 & meds changed by TH> off diuretics, KCl.  ~  labs 10/15/09 showed Na=143, K=4.0, HCO3=37, BUN=12, Creat=0.8, BNP=761... restart Lasix, Diamox, KCl (one each).  ~  labs 10/25/09 showed HCO3=44, BNP=588... rec> incr Diamox to 2Qpm...  ATRIAL FIBRILLATION (ICD-427.31) & SICK SINUS/ TACHY-BRADY SYNDROME (ICD-427.81) - new prob 1/10 w/ eval by DrKlein- rate control strategy on Propranolol & Norvasc w/ Coumadin via the [facility].  ~  EKG 1/10 showed AFib, rate ~ 60, NSSTTWA...  ~  2DEcho 3/10 showed norm LVF w/ EF= 60% & no regional wall motion abn, mild calcif AoV w/ mild AI, mild LA & RA dil w/ incr thickness of interatrial septum c/w lipomatous hypertrophy, PA sys est at 50...  ~  Holter monitor= AFib w/ rate 45-97...  VENOUS INSUFFICIENCY (ICD-459.81) -  mild persist edema despite the sodium restriction, Lasix and Diamox... reviewed recommendation for No Salt, Elevation, TED's, etc...  DIABETES MELLITUS, BORDERLINE (ICD-790.29) - prev on Metformin 500mg /d (held after 4/11 hosp)...  ~  last BS=181, HgA1c=6.4 in 11/08... BS= 142 2/09...  ~  labs 6/09 showed BS= 101, HgA1c= 6.4  ~  labs 12/09 showed BS= 139,  HgA1c= 6.1  ~  labs 6/10 showed BS= 123, A1c= 6.1  ~  7/10: neg ophthal f/u DrHecker- no retinopathy, no macular edema.  ~  labs 9/10 showed BS= 93  ~  labs 1/11 showed BS=146, A1c=5.7  ~  labs 5/11 showed BS= 100 on diet alone...  DIVERTICULOSIS OF COLON, IRRITABLE BOWEL SYNDROME & COLONIC POLYPS (ICD-211.3)  ~  colonoscopy was 11/06 by DrStark showing divertics and several polyps (one 20mm adenoma)... f/u 48yrs.  ~  colonoscopy 1/10 by DrStark showed divertics (severe in sigmoid), 4mm polyp= tub adenoma... f/u 3 yrs.  BILAT BREAST CANCERS - resected via mastectomies in 1996 and 2002... followed by [facility] annually & seen 12/10.  DEGENERATIVE JOINT DISEASE (ICD-715.90) - w/ hx hyperuricemia on ALLOPURINOL 300mg /d...  Hx of HEADACHE (ICD-784.0)  ANXIETY (ICD-300.00) - on ALPRAZOLAM 0.5mg  Tid Prn...  ~  Remeron 7.5mg /d started 4/11 hosp & stopped 5/11 due to lethargy, sleepiness...   Allergies: 1)  ! * Ivp Dye  Comments:  Nurse/Medical Assistant: The patient's medications and allergies were reviewed with the patient and were updated in the Medication and Allergy Lists.  Past History:  Past Medical History: ALLERGIC RHINITIS (ICD-477.9) Hx of HYPOXEMIA (ICD-799.02) HYPERTENSION (ICD-401.9) ATRIAL FIBRILLATION (ICD-427.31) SICK SINUS/ TACHY-BRADY SYNDROME (ICD-427.81) VENOUS INSUFFICIENCY (ICD-459.81) DIABETES MELLITUS, BORDERLINE (ICD-790.29) DIVERTICULOSIS OF COLON (ICD-562.10) IRRITABLE BOWEL SYNDROME (ICD-564.1) COLONIC POLYPS (ICD-211.3) ADENOCARCINOMA, BREAST, BILATERAL, HX OF (ICD-V10.3) DEGENERATIVE JOINT  DISEASE (ICD-715.90) Hx of HEADACHE (ICD-784.0) ANXIETY (ICD-300.00)  Past Surgical History: Cataract extraction Cholecystectomy S/P right mastectomy for breast cancer in 1996 S/P left mastectomy for cancer 1/02 by [facility] S/P Right TKR by DrRendall 6/04  Family History: Reviewed history from 11/24/2007 and no changes required. mother died at 62 of heart attack father died at 29 yrs of bleeding ulcer sister has kidney cancer brother age 81 has had open heart surgery  1 brother alive and well age 49 family history of asthma in patient's brother, and cancer in sister, daughter has breast cancer  Social History: Reviewed history from 11/24/2007 and no changes required. 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  The patient denies anorexia, fever, weight loss, weight gain, vision loss, decreased hearing, hoarseness, chest pain, syncope, prolonged cough, headaches, hemoptysis, abdominal pain, melena, hematochezia, severe indigestion/heartburn, hematuria, incontinence, suspicious skin lesions, transient blindness, depression, unusual weight change, abnormal bleeding, enlarged lymph nodes, and angioedema.    Vital Signs:  Patient profile:   77 year old female Height:      63 inches O2 Sat:      89 % on 3 L/min Temp:     97.0 degrees F oral Pulse rate:   75 / minute BP sitting:   102 / 64  (left arm) Cuff size:   regular  Vitals Entered By: Randell Loop CMA (Oct 25, 2009 10:57 AM)  O2 Sat at Rest %:  89 O2 Flow:  3 L/min  O2 Sat Comments pt did come up to 95% on 3 liters   Physical Exam  Additional Exam:  WD, WN, 77 y/o BF in NAD... GENERAL:  Alert & oriented; pleasant & cooperative... HEENT:  [place]/AT, EOM-wnl, PERRLA, EACs-clear, TMs-wnl, NOSE-clear, THROAT-clear & wnl. NECK:  Supple w/  fairROM; no JVD; normal carotid impulses w/o bruits; no thyromegaly or nodules palpated; no lymphadenopathy. CHEST:  Clear to P & A; without wheezes/ rales/ or rhonchi heard... HEART:  irregular rhythm; gr 1/6 SEM without rubs or gallops appreciated... ABDOMEN:  Soft & nontender; normal bowel sounds; no organomegaly or masses detected. EXT: without deformities, mild  arthritic changes; no varicose veins/ +venous insuffic/ 1+edema. NEURO:  CN's intact; no focal neuro deficits... DERM:  No lesions noted; no rash etc...    MISC. Report  Procedure date:  10/25/2009  Findings:      BMP (METABOL)   Sodium               [H]  146 mEq/L                   135-145   Potassium                 3.8 mEq/L                   3.5-5.1   Chloride             [L]  95 mEq/L                    96-112   Carbon Dioxide       [H]  44 mEq/L                    19-32   Glucose              [H]  159 mg/dL                   16-10   BUN                       15 mg/dL                    9-60   Creatinine                0.7 mg/dL                   4.5-4.0   Calcium                   9.1 mg/dL                   9.8-11.9   GFR                       110.31 mL/min               >60  B-Type Natiuretic Peptide (BNPR)  B-Type Natriuetic Peptide                        [H]  588.5 pg/mL                 0.0-100.0   Impression & Recommendations:  Problem # 1:  ATRIAL FIBRILLATION (ICD-427.31) On Coumadin & rate control strategy... Her updated medication list for this problem includes:    Aspirin 81 Mg Tbec (Aspirin) .Marland Kitchen... Take 1 tab by mouth once daily...    Warfarin Sodium 5 Mg Tabs (Warfarin sodium) ..... Use as directed by anticoagulation clinic    Propranolol Hcl 80 Mg Tabs (Propranolol hcl) .Marland Kitchen... Take 1/2 tablet by mouth two times a day  Problem # 2:  HYPERTENSION (ICD-401.9) BP is stable... we wil incr the Diamox based on the Bicarb level and BNP... Her updated medication list for this problem includes:     Propranolol Hcl 80 Mg Tabs (Propranolol hcl) .Marland Kitchen... Take 1/2 tablet by mouth two times a day    Quinapril Hcl 40 Mg Tabs (Quinapril hcl) .Marland KitchenMarland KitchenMarland KitchenMarland Kitchen  Take 1 tablet by mouth once a day    Lasix 40 Mg Tabs (Furosemide) .Marland Kitchen... Take 1 tablet once daily in am...    Acetazolamide 250 Mg Tabs (Acetazolamide) .Marland Kitchen... Take 1 tab by mouth once daily at 4 pm...  Orders: TLB-BMP (Basic Metabolic Panel-BMET) (80048-METABOL) TLB-BNP (B-Natriuretic Peptide) (83880-BNPR)  Problem # 3:  DIABETES MELLITUS, BORDERLINE (ICD-790.29) Stable on diet alone...  Problem # 4:  IRRITABLE BOWEL SYNDROME (ICD-564.1) GI is stable...  Problem # 5:  DEGENERATIVE JOINT DISEASE (ICD-715.90) She needs incr exercise etc... Her updated medication list for this problem includes:    Aspirin 81 Mg Tbec (Aspirin) .Marland Kitchen... Take 1 tab by mouth once daily...  Problem # 6:  OTHER MEDICAL PROBLEMS AS NOTED>>>  Complete Medication List: 1)  Proair Hfa 108 (90 Base) Mcg/act Aers (Albuterol sulfate) .Marland Kitchen.. 1-2 puffs every 4-6 hours as needed 2)  Aspirin 81 Mg Tbec (Aspirin) .... Take 1 tab by mouth once daily.Marland KitchenMarland Kitchen 3)  Warfarin Sodium 5 Mg Tabs (Warfarin sodium) .... Use as directed by anticoagulation clinic 4)  Propranolol Hcl 80 Mg Tabs (Propranolol hcl) .... Take 1/2 tablet by mouth two times a day 5)  Quinapril Hcl 40 Mg Tabs (Quinapril hcl) .... Take 1 tablet by mouth once a day 6)  Lasix 40 Mg Tabs (Furosemide) .... Take 1 tablet once daily in am... 7)  Acetazolamide 250 Mg Tabs (Acetazolamide) .... Take 1 tab by mouth once daily at 4 pm... 8)  Klor-con M20 20 Meq Tbcr (Potassium chloride crys cr) .... ***  hold  *** 9)  Allopurinol 300 Mg Tabs (Allopurinol) .... Take 1 tablet by mouth once a day 10)  Multivitamins Tabs (Multiple vitamin) .... Take 1 tablet by mouth once a day 11)  Alprazolam 0.5 Mg Tabs (Alprazolam) .... As needed 12)  Meclizine Hcl 25 Mg Tabs (Meclizine hcl) .... Take one tablet by mouth every 4 hours prn 13)  Hycodan Syrup   .Marland Kitchen.. 1 tsp every 8 hours as needed cough  Patient Instructions: 1)  Today we updated your med list- see below.... 2)  We decided to decrease the PROPRANOLOL to 1/2 tab twice daily due to your BP being sl low... 3)  Continue the other meds the same... 4)  Today we rechecked your blood tests... please call the "phone tree" in a few days for your lab results.Marland KitchenMarland Kitchen 5)  You may try Tylenol PM or 1/2 of the ALPRAZOLAM at bedtime to help you rest... 6)  Try to stay up during the day and increase your exercise... 7)  Let's plan a follow up visit in 3 weeks.   Immunization History:  Pneumovax Immunization History:    Pneumovax:  pneumovax (10/10/2009)

## 2010-07-13 NOTE — Progress Notes (Signed)
Summary: talk to nurse  Phone Note From Other Clinic Call back at [phone]   Caller: kim//call a nurse Call For: nadel Summary of Call: Pt has had diarrhea for 3 days, instructed to be seen in 24 hours. Home care advice and call back parameters given per diarrhea guidelines. Initial call taken by: Darletta Moll,  December 21, 2009 9:21 AM  Follow-up for Phone Call        called and spoke with family member---she stated that the diarrhea x 2 days--started on sunday and through tuesday---has not had any today---pt also c/o of feet and ankles swelling.  she is taking her lasix 40mg  every am.   family stated that pt called an afterhours nurse and she instructed her to start on bland diet--applesauce, soup, gatorade---pt has been doing this.  please advise. Randell Loop [facility]  December 21, 2009 9:34 AM   Additional Follow-up for Phone Call Additional follow up Details #1::        per SN---sounds like a gastroenteritis---sounds like its getting better now---rec clear liquid diet and activia yogurt and align daily...keep the meds the same  no salt, elevated legs and wear the support hose daily.  spoke wtih pts daughter kim and she is aware or recs per SN.  she stated that pt is doing much better today. Randell Loop [facility]  December 21, 2009 11:54 AM

## 2010-07-13 NOTE — Progress Notes (Signed)
Summary: Pharmacy change  Phone Note Call from Patient   Caller: Patient Call For: NADEL Summary of Call: PT WANT TO TALK TO NURSE ABOUT CHANGING ALL OF HER MEDS. Initial call taken by: Rickard Patience,  March 20, 2010 10:04 AM  Follow-up for Phone Call        Pt states she received a call from "out of town" asking if she wanted to have her medications mailed to her 3 months at a time. Pt agreed with same but since getting off of the phone with the rep she has changed her mind and does not want to change from [facility]. Does not recall name of company but stated they did need SN approval to do same. Will forward to [place] for FYI. Zackery Barefoot CMA  March 20, 2010 10:23 AM   Additional Follow-up for Phone Call Additional follow up Details #1::        per SN----tell her we will deny this to the company when they call or send paper work in. Randell Loop [facility]  March 20, 2010 11:02 AM     Additional Follow-up for Phone Call Additional follow up Details #2::    Pt informed of SN recs. Zackery Barefoot CMA  March 20, 2010 11:17 AM

## 2010-07-13 NOTE — Progress Notes (Signed)
Summary: new prescription   Phone Note Call from Patient Call back at Home Phone [phone]   Caller: Patient Call For: dr Kriste Basque Summary of Call: patient phoned and stated that she needs refills for her Meclizine 25 MG [facility] [address] Patient stated that she called here and was [facility] from them so she needs a new prescription called in for this. Patient can be reached [phone] she will be at home until 2:30 and she has another doctors appt at 3:00pm Initial call taken by: Vedia Coffer,  June 30, 2010 11:40 AM  Follow-up for Phone Call        Pt informed that rx was sent to pharmacy. Abigail Miyamoto RN  June 30, 2010 11:57 AM     Prescriptions: MECLIZINE HCL 25 MG  TABS (MECLIZINE HCL) take one tablet by mouth every 4 hours prn  #30 x 6   Entered by:   Abigail Miyamoto RN   Authorized by:   Michele Mcalpine MD   Signed by:   Abigail Miyamoto RN on 06/30/2010   Method used:   [facility]  [address] [phone][facility] (retail)       [address]       [place], [place]  [id number]       Ph: [phone] or [phone]       Fax: [phone]   RxID:   [id number]

## 2010-07-13 NOTE — Assessment & Plan Note (Signed)
Summary: PER TAMMY D/[facility]   Primary Care Provider:  Alroy Dust, MD  CC:  4 month ROV & post-hosp check....  History of Present Illness: 77 y/o BF here for a follow up visit... she has multiple medical problems as noted below...     ~  Dec09:  stable over the last 6 months- incr 8# in weight and has some periph edema- we discussed low sodium diet and wear support hose, etc... her sister died in [facility] 03/29/2023 & this was stressful.  ~  Jan10:  she had her 71yr f/u colonoscopy 1/10 by DrStark w/ severe diverticulosis and 4mm polyp= tubular adenoma w/ f/u rec in another 67yrs... she states that her heart was irreg on the monitoring for the colonoscopy & she she was told to f/u w/ me & made this appt... we reviewed the colon results and recs- she notes that she is asymptomatic from the cardiac standpoint- no CP, no palpit (no skipping, racing, or fluttering noted), denies dizzy/ lightheaded/ syncope, denies incr SOB or new exertional symptoms... RISKS include +FamHx, HBP, DM, LDL last check 116 (in 2008- other parameters normal)... last seen 06/07/08 and rhythm was regular on exam at that time... last EKG in 2007 showed NSR... she is not aware of irreg pulse... EKG today shows AFib- controlled VR>>> refer to Cards.  ~  Feb10:  she saw DrKlein w/ the AFib and he did:  1) 2DEcho= norm LVF w/ EF= 60% & no regional wall motion abn, mild calcif AoV w/ mild AI, mild LA & RA dil w/ incr thickness of interatrial septum c/w lipomatous hypertrophy, PA sys est at 50... 2) Holter monitor= AFib w/ rate 45-97... he stopped her Diltiazem & Indocin (due to BP)- and added PROPRANOLOL 80mg Bid & AMLODIPINE 5mg /d & COUMADIN at 5mg /d.  ~  Apr10:  ret today feeling sl better but confused about the new meds... we reviewed them w/ the pt and daughter... we will check her Protime today and set her up to see Shelby Dubin in the [facility].  ~  Jun10:  somehow she never made it to the [facility]- she's been on Coumadin 5mg /d since  Feb- one Protime 09/09/08 = 19.0/ INR 1.8.Marland KitchenMarland Kitchen no bruising or bleeding complications noted... I called Shelby Dubin and will check Protime today w/ f/u in the CC as discussed... we reviewed her other meds and all correct... she notes SOB/ DOE, c/o no energy & dry mouth... O2 sat on RA= 91%, ambulating in office- 3 laps w/ HR 122 & sats dropped to 86% (similar results to prev & she had refused home O2)... ** plan>> check labs and arrange for home oxygen- 1L/min rest & 2L/min exercise.  ~  Sep10:  she's been followed in the [facility]inic- has hematoma on upper arm w/o known trauma... saw DrKlein 7/10 for f/u AFib- he stopped Amlodipine due to edema & she reports edema decr & BP OK...   ~  June 15, 2009:  she's had a quiet 79mo- stable, no new complaints or concerns... she had her 2010 flu shot in Sep09, taking meds regularly & tol well- we will f/u labs today.   ~  Oct 15, 2009:  She was adm 4/27-28/11 w/ confusion, weakness w/ fall (no signif trauma), ?dehydration- she was somewhat depresssed over the passing of her sister w/ poor intake, & UTI treated w/ fluids & Cipro (diuretics held as well)... MEDS REVIEWED> & adjustments made, see below...  since disch she continues weak w/ mult somatic complaints>  we decided to restart her diuretic regimen & check f/u labs/ urine, and refer for home therapy...    Current Problem List:  ALLERGIC RHINITIS (ICD-477.9) - on FLONASE Qhs, but c/o occas runny nose- Rx [id number] QAM Prn.  Hx of HYPOXEMIA (ICD-799.02) - full eval for hypoxemia in [id number]- CXR= cardiomegaly, ectatic Ao, pulm ven HTN... CTAngio= neg, without PE, min scarring in lingula... 2DEcho= norm LVF w/ EF=55%, LVwall thickness at upper limits, mild ca++ AoV w/ mild AI,  PA sys pressure = ... O2 sats 90-94 at rest on RA... w/ ambulation in the office sats drop to 85%... she does not yet want to go on oxygen at home... just occas SOB w/ activity- she is too sedentary and needs to incr her exercise  program.  ~  6/10: c/o increased DOE & no energy- O2 sat= 91% rest & 86% w/activ... rec> home O2.  HYPERTENSION (ICD-401.9) - controlled on PROPRANOLOL 80mg Bid, QUINAPRIL 40mg /d, off LASIX & K20 now... on DIAMOX 250mg  daily at 4PM... BP=124/82 today and she tries to stay away from salt, etc...  ~  labs 2/09 showed Na143, K3.6, CL101, CO2 36, BUN 15, Cr0.9.Marland KitchenMarland Kitchen BNP was 194...  ~  labs 6/09 showed lytes normal x TCO2= 37, & BNP= 512...  ~  labs 12/09 showed HCO3- 36,  BNP= 586... rec- same meds, no salt!  ~  labs 6/10 showed Na= 146, K= 3.6, CO2= 36, BUN= 14, Creat= 0.9, BNP= 336.  ~  labs 9/10 showed Na-147, K=3.7, HCO3=36, BUN=18, Creat=0.9, BNP=484  ~  labs 1/11 showed Na=144, K=3.9, HCO3=35, BUN=17, Creat=0.9, BNP=488  ~  [facility] 4/11 & meds changed by TH> off diuretics, KCl.  ~  labs 5/11 showed Na=143, K=4.0, HCO3=37, BUN=12, Creat=0.8, BNP=761... restart Lasix, Diamox, KCl (one each).  ATRIAL FIBRILLATION (ICD-427.31) & SICK SINUS/ TACHY-BRADY SYNDROME (ICD-427.81) - new prob 1/10 w/ eval by DrKlein- rate control strategy on Propranolol & Norvasc w/ Coumadin via the [facility].  ~  EKG 1/10 showed AFib, rate ~ 60, NSSTTWA...  ~  2DEcho 3/10 showed norm LVF w/ EF= 60% & no regional wall motion abn, mild calcif AoV w/ mild AI, mild LA & RA dil w/ incr thickness of interatrial septum c/w lipomatous hypertrophy, PA sys est at 50...  ~  Holter monitor= AFib w/ rate 45-97...  VENOUS INSUFFICIENCY (ICD-459.81) - mild persist edema despite the sodium restriction, Lasix and Diamox... reviewed recommendation for No Salt, Elevation, TED's, etc...  DIABETES MELLITUS, BORDERLINE (ICD-790.29) - prev on Metformin 500mg /d (held after 4/11 hosp)...  ~  last BS=181, HgA1c=6.4 in 11/08... BS= 142 2/09...  ~  labs 6/09 showed BS= 101, HgA1c= 6.4  ~  labs 12/09 showed BS= 139,  HgA1c= 6.1  ~  labs 6/10 showed BS= 123, A1c= 6.1  ~  7/10: neg ophthal f/u DrHecker- no retinopathy, no macular edema.  ~  labs  9/10 showed BS= 93  ~  labs 1/11 showed BS=146, A1c=5.7  ~  labs 5/11 showed BS= 100 on diet alone...  DIVERTICULOSIS OF COLON, IRRITABLE BOWEL SYNDROME & COLONIC POLYPS (ICD-211.3)  ~  colonoscopy was 11/06 by DrStark showing divertics and several polyps (one 20mm adenoma)... f/u 100yrs.  ~  colonoscopy 1/10 by DrStark showed divertics (severe in sigmoid), 4mm polyp= tub adenoma... f/u 3 yrs.  BILAT BREAST CANCERS - resected via mastectomies in 1996 and 2002... followed by [facility] annually & seen 12/10.  DEGENERATIVE JOINT DISEASE (ICD-715.90) - w/ hx hyperuricemia on ALLOPURINOL 300mg /d...  Hx of HEADACHE (  ICD-784.0)  ANXIETY (ICD-300.00) - on ALPRAZOLAM 0.5mg  Tid Prn...  ~  Remeron 7.5mg /d started 4/11 hosp & stopped 5/11 due to lethargy, sleepiness...   Allergies: 1)  ! * Ivp Dye  Comments:  Nurse/Medical Assistant: The patient's medications and allergies were reviewed with the patient and were updated in the Medication and Allergy Lists.  Past History:  Past Medical History: ALLERGIC RHINITIS (ICD-477.9) Hx of HYPOXEMIA (ICD-799.02) HYPERTENSION (ICD-401.9) ATRIAL FIBRILLATION (ICD-427.31) SICK SINUS/ TACHY-BRADY SYNDROME (ICD-427.81) VENOUS INSUFFICIENCY (ICD-459.81) DIABETES MELLITUS, BORDERLINE (ICD-790.29) DIVERTICULOSIS OF COLON (ICD-562.10) IRRITABLE BOWEL SYNDROME (ICD-564.1) COLONIC POLYPS (ICD-211.3) ADENOCARCINOMA, BREAST, BILATERAL, HX OF (ICD-V10.3) DEGENERATIVE JOINT DISEASE (ICD-715.90) Hx of HEADACHE (ICD-784.0) ANXIETY (ICD-300.00)  Past Surgical History: Cataract extraction Cholecystectomy S/P right mastectomy for breast cancer in 1996 S/P left mastectomy for cancer 1/02 by [facility] S/P Right TKR by DrRendall 6/04  Family History: Reviewed history from 11/24/2007 and no changes required. mother died at 66 of heart attack father died at 8 yrs of bleeding ulcer sister has kidney cancer brother age 60 has had open heart surgery  1 brother  alive and well age 38 family history of asthma in patient's brother, and cancer in sister, daughter has breast cancer  Social History: Reviewed history from 11/24/2007 and no changes required. 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  The patient denies anorexia, fever, weight loss, weight gain, vision loss, hoarseness, chest pain, syncope, prolonged cough, headaches, hemoptysis, abdominal pain, melena, hematochezia, severe indigestion/heartburn, hematuria, incontinence, suspicious skin lesions, transient blindness, unusual weight change, abnormal bleeding, enlarged lymph nodes, and angioedema.    Vital Signs:  Patient profile:   77 year old female Height:      63 inches O2 Sat:      99 % on 5 L/min Temp:     98.1 degrees F oral Pulse rate:   93 / minute BP sitting:   124 / 82  (right arm) Cuff size:   regular  Vitals Entered By: Randell Loop CMA (Oct 12, 2009 11:51 AM)  O2 Sat at Rest %:  99 O2 Flow:  5 L/min CC: 4 month ROV & post-hosp check... Is Patient Diabetic? Yes Pain Assessment Patient in pain? no      Comments meds updated today   Physical Exam  Additional Exam:  WD, WN, 77 y/o BF in NAD... GENERAL:  Alert & oriented; pleasant & cooperative... HEENT:  [place]/AT, EOM-wnl, PERRLA, EACs-clear, TMs-wnl, NOSE-clear, THROAT-clear & wnl. NECK:  Supple w/ fairROM; no JVD; normal carotid impulses w/o bruits; no thyromegaly or nodules palpated; no lymphadenopathy. CHEST:  Clear to P & A; without wheezes/ rales/ or rhonchi heard... HEART:  irregular rhythm; gr 1/6 SEM without rubs or gallops appreciated... ABDOMEN:  Soft & nontender; normal bowel sounds; no organomegaly or masses detected. EXT: without deformities, mild arthritic changes; no varicose veins/  +venous insuffic/ 1+edema. NEURO:  CN's intact; no focal neuro deficits... DERM:  No lesions noted; no rash etc...    MISC. Report  Procedure date:  10/12/2009  Findings:      BMP (METABOL)   Sodium                    143 mEq/L  135-145   Potassium                 4.0 mEq/L                   3.5-5.1   Chloride                  102 mEq/L                   96-112   Carbon Dioxide       [H]  37 mEq/L                    19-32   Glucose              [H]  100 mg/dL                   19-14   BUN                       12 mg/dL                    7-82   Creatinine                0.8 mg/dL                   9.5-6.2   Calcium                   9.2 mg/dL                   1.3-08.6   GFR                       88.35 mL/min                >60  CBC Platelet w/Diff (CBCD)   White Cell Count          9.8 K/uL                    4.5-10.5   Red Cell Count       [L]  3.68 Mil/uL                 3.87-5.11   Hemoglobin           [L]  11.6 g/dL                   57.8-46.9   Hematocrit                36.6 %                      36.0-46.0   MCV                       99.5 fl                     78.0-100.0   Platelet Count            197.0 K/uL                  150.0-400.0   Neutrophil %         [H]  80.6 %                      43.0-77.0  Lymphocyte %         [L]  7.3 %                       12.0-46.0   Monocyte %                10.9 %                      3.0-12.0   Eosinophils%              0.9 %                       0.0-5.0   Basophils %               0.3 %                       0.0-3.0  B-Type Natiuretic Peptide (BNPR)  B-Type Natriuetic Peptide                        [H]  761.0 pg/mL     Comments:      UDip w/Micro (URINE)   Color                     YELLOW   Clarity                   CLEAR                       Clear   Specific Gravity          1.020                       1.000 - 1.030   Urine Ph                  6.0                         5.0-8.0   Protein                    NEGATIVE                    Negative   Urine Glucose             NEGATIVE                    Negative   Ketones                   NEGATIVE                    Negative   Urine Bilirubin           NEGATIVE                    Negative   Blood                     SMALL                       Negative   Urobilinogen              0.2  0.0 - 1.0   Leukocyte Esterace        NEGATIVE                    Negative   Nitrite                   NEGATIVE                    Negative   Urine WBC                 0-2/hpf                     0-2/hpf   Urine RBC                 0-2/hpf                     0-2/hpf   Urine Epith               Few(5-10/hpf)               Rare(0-4/hpf)  PT (PTP)   Prothrombin Time     [H]  41.3 sec                    9.1-11.7   INR                  [H]  4.0 ratio                   0.8-1.0   Impression & Recommendations:  Problem # 1:  Hx of HYPOXEMIA (ICD-799.02) We decided to restart the Lasix 40mg /d in AM, restart the Diamox 250mg  daily at 4PM... ROV in 2 weeks w/ follow up labs...  Problem # 2:  HYPERTENSION (ICD-401.9) Controlled>  restarting diretic Rx & following labs. Her updated medication list for this problem includes:    Propranolol Hcl 80 Mg Tabs (Propranolol hcl) .Marland Kitchen... Take 1 tablet by mouth two times a day    Quinapril Hcl 40 Mg Tabs (Quinapril hcl) .Marland Kitchen... Take 1 tablet by mouth once a day    Lasix 40 Mg Tabs (Furosemide) .Marland Kitchen... Take 1 tablet once daily in am...    Acetazolamide 250 Mg Tabs (Acetazolamide) .Marland Kitchen... Take 1 tab by mouth once daily at 4 pm...  Orders: TLB-BMP (Basic Metabolic Panel-BMET) (80048-METABOL) TLB-CBC Platelet - w/Differential (85025-CBCD) TLB-BNP (B-Natriuretic Peptide) (83880-BNPR) TLB-Udip w/ Micro (81001-URINE)  Problem # 3:  ATRIAL FIBRILLATION (ICD-427.31) Followed by DrKlein for Cards & the Coumadin clinic... Protime is too thin-  hold x 2 d & f/u w/ Coumadin Clinic... Her updated medication list for this  problem includes:    Aspirin 81 Mg Tbec (Aspirin) .Marland Kitchen... Take 1 tab by mouth once daily...    Warfarin Sodium 5 Mg Tabs (Warfarin sodium) ..... Use as directed by anticoagulation clinic    Propranolol Hcl 80 Mg Tabs (Propranolol hcl) .Marland Kitchen... Take 1 tablet by mouth two times a day  Orders: TLB-PT (Protime) (85610-PTP)  Problem # 4:  DIABETES MELLITUS, BORDERLINE (ICD-790.29) Sugar is OK off Metformin & on diet + exercise... continue to follow. The following medications were removed from the medication list:    Metformin Hcl 500 Mg Tb24 (Metformin hcl) .Marland Kitchen... Take 1 tablet by mouth once a day  Problem # 5:  OTHER MEDICAL PROBLEMS AS NOTED>>> Urinalysis w/o infection at present...  Complete Medication List: 1)  Proair Hfa 108 (  90 Base) Mcg/act Aers (Albuterol sulfate) .Marland Kitchen.. 1-2 puffs every 4-6 hours as needed 2)  Aspirin 81 Mg Tbec (Aspirin) .... Take 1 tab by mouth once daily.Marland KitchenMarland Kitchen 3)  Warfarin Sodium 5 Mg Tabs (Warfarin sodium) .... Use as directed by anticoagulation clinic 4)  Propranolol Hcl 80 Mg Tabs (Propranolol hcl) .... Take 1 tablet by mouth two times a day 5)  Quinapril Hcl 40 Mg Tabs (Quinapril hcl) .... Take 1 tablet by mouth once a day 6)  Lasix 40 Mg Tabs (Furosemide) .... Take 1 tablet once daily in am... 7)  Acetazolamide 250 Mg Tabs (Acetazolamide) .... Take 1 tab by mouth once daily at 4 pm... 8)  Klor-con M20 20 Meq Tbcr (Potassium chloride crys cr) .... ***  hold  *** 9)  Allopurinol 300 Mg Tabs (Allopurinol) .... Take 1 tablet by mouth once a day 10)  Multivitamins Tabs (Multiple vitamin) .... Take 1 tablet by mouth once a day 11)  Alprazolam 0.5 Mg Tabs (Alprazolam) .... As needed 12)  Meclizine Hcl 25 Mg Tabs (Meclizine hcl) .... Take one tablet by mouth every 4 hours prn 13)  Hycodan Syrup  .Marland Kitchen.. 1 tsp every 8 hours as needed cough  Other Orders: DME Referral (DME)  Patient Instructions: 1)  Today we updated your med list- see below.... 2)  We made several changes  today: 3)  Take one Furosemide in the AM..Marland Kitchen 4)  Take one Acetazolamide at 4 PM..Marland Kitchen 5)  Do not take the KCl (potassium) for now... 6)  Do not take the previous METFORMIN diabetic pill for now... 7)  Stop the MIRTAZAPINE (Remeron) given in the hosp... 8)  We will arrange for home physical therapy to help her gain strength... 9)  Today we rechecked her blood & urine-  please call the "phone tree" in a few days for your lab results... 10)  Let's plan a recheck in 2 weeks.Marland KitchenMarland Kitchen

## 2010-07-13 NOTE — Medication Information (Signed)
Summary: ccr  Anticoagulant Therapy  Managed by: Eda Keys, PharmD Referring MD: Sherryl Manges, MD PCP: Alroy Dust, MD Supervising MD: Tenny Craw MD, Gunnar Fusi Indication 1: Atrial Fibrillation Lab Used: [facility] [place] Site: [address] INR POC 2.0 INR RANGE 2.0 - 3.0  Dietary changes: no    Health status changes: yes       Details: recent hospitalization for weakness and fall  Bleeding/hemorrhagic complications: no    Recent/future hospitalizations: yes       Details: Pt recently discharged from hosp 10/06/09  Any changes in medication regimen? yes       Details: Pt recently finished 5-d cipro course, finished middle of last week  Recent/future dental: no  Any missed doses?: no       Is patient compliant with meds? yes      Comments: Pt resumed coumadin upon hospital discharge, she takes 1.5 tablets everyday, except 1 wednesday and saturday.    Allergies: 1)  ! * Ivp Dye  Anticoagulation Management History:      The patient is taking warfarin and comes in today for a routine follow up visit.  Positive risk factors for bleeding include an age of 75 years or older.  The bleeding index is 'intermediate risk'.  Positive CHADS2 values include History of HTN and Age > 5 years old.  Her last INR was 4.0 ratio.  Anticoagulation responsible provider: Tenny Craw MD, Gunnar Fusi.  INR POC: 2.0.  Cuvette Lot#: [id number].  Exp: 01/2011.    Anticoagulation Management Assessment/Plan:      The patient's current anticoagulation dose is Warfarin sodium 5 mg tabs: Use as directed by Anticoagulation Clinic.  The target INR is 2.0-3.0.  The next INR is due 11/04/2009.  Anticoagulation instructions were given to patient.  Results were reviewed/authorized by Eda Keys, PharmD.  She was notified by Eda Keys.         Prior Anticoagulation Instructions: INR 2.6  Continue on same dosage 1.5 tablets daily except 1 tablet on Wednesdays and Saturdays.  Recheck in 4 weeks.   Current  Anticoagulation Instructions: INR 2.0  Continue taking 1.5 tablets everyday, except take 1 tablet on Wednesday and Saturday.  Return to clinic in 3 weeks.

## 2010-07-13 NOTE — Miscellaneous (Signed)
Summary: Physician Order/[facility]  Physician Order/[facility]   Imported By: Sherian Rein 11/03/2009 13:34:56  _____________________________________________________________________  External Attachment:    Type:   Image     Comment:   External Document

## 2010-07-13 NOTE — Progress Notes (Signed)
Summary: nos appt  Phone Note Call from Patient   Caller: [email or web address]  Call For: nadel Summary of Call: Rsc nos from 1/26 to 2/14. Initial call taken by: Darletta Moll,  July 07, 2010 10:14 AM

## 2010-07-13 NOTE — Progress Notes (Signed)
Summary: talk to nurse  Phone Note Call from Patient Call back at [phone]   Caller: [email or web address] Call For: kim Reason for Call: Talk to Nurse Summary of Call: Wants to talk to nurse in reference to metformin medication. Initial call taken by: Darletta Moll,  December 05, 2009 4:10 PM  Follow-up for Phone Call        Spoke with pt's daughter Selena Batten and advised per SN that pt should be off of the metformin and control with diet.  Daughter verbalized understanding. Follow-up by: Vernie Murders,  December 05, 2009 4:20 PM

## 2010-07-13 NOTE — Letter (Signed)
Summary: CMN/[facility]  CMN/[facility]   Imported By: Lester [place] 01/25/2010 08:34:21  _____________________________________________________________________  External Attachment:    Type:   Image     Comment:   External Document

## 2010-07-13 NOTE — Miscellaneous (Signed)
Summary: Order/[facility]  Order/[facility]   Imported By: Lester [place] 11/24/2009 07:42:23  _____________________________________________________________________  External Attachment:    Type:   Image     Comment:   External Document

## 2010-07-13 NOTE — Assessment & Plan Note (Signed)
Summary: 2 month follow up/la   Primary Care [facility]:  Cheyenne Dust, MD  CC:  2 month ROV & review of mult medical problems....  History of Present Illness: 77 y/o BF here for a follow up visit... she has multiple medical problems as noted below>>>   ~  Oct 15, 2009:  She was adm 4/27-28/11 w/ confusion, weakness w/ fall (no signif trauma), ?dehydration- she was somewhat depresssed over the passing of her sister w/ poor intake, & UTI treated w/ fluids & Cipro (diuretics held as well)... MEDS REVIEWED> & adjustments made, see below...  since disch she continues weak w/ mult somatic complaints> we decided to restart her diuretic regimen and refer for home therapy...   ~  Oct 25, 2009:  she's had some intermittent SOB, not resting well & daughter does not want sleeping pills due to prev reaction... they will try Tylenol PM & consider 1/2 of the Alpraz0.5mg ... protimes monitored in the CC now... on one Lasix & K20 Qam, & one Diamox Qpm> f/u labs shows HCO3=44, BNP=588... rec> incr the Diamox to 2 Qpm...   ~  November 15, 2009:  she is improved- feeling better overall... resting better, appetite has returned, etc... weight 157# is down 10# from 1/11 and edema improved down to trace at present... BP controlled on meds;  chr AFib w/ rate control strategy & Coumadin via CC;  BS OK on diet alone...  we reviewed diuretic regimen of Lasix 40mg AM, KCl , Acetazolamide 250mg - 2tabsPM...   ~  January 11, 2010:  stable over the last few months but weight up 3# and sl incr edema> we reviewed 2gm Na+ diet etc... BP controlled, AFib w/ rate control strategy & coumadin, otherw stable but she is requesting Liq Oxygen to incr mobility... check BMet/ BNP...   Current Problem List:  ALLERGIC RHINITIS (ICD-477.9) - on FLONASE Qhs & ZYRTEK QAM Prn.  Hx of HYPOXEMIA (ICD-799.02) - full eval for hypoxemia in [id number]- CXR= cardiomegaly, ectatic Ao, pulm ven HTN... CTAngio= neg, without PE, min scarring in lingula...  2DEcho= norm LVF w/ EF=55%, LVwall thickness at upper limits, mild ca++ AoV w/ mild AI,  PA sys pressure = ... O2 sats 90-94 at rest on RA... w/ ambulation in the office sats drop to 85%... she didn't yet want to go on oxygen at home... just occas SOB w/ activity- she is too sedentary and needs to incr her exercise program.  ~  6/10: c/o increased DOE & no energy- O2 sat= 91% rest & 86% w/activ... rec> home O2 1L/min rest, 2L/min exercise.  ~  8/11:  pt requesting change to LiqO2 to incr mobility.  HYPERTENSION (ICD-401.9) - controlled on PROPRANOLOL 80mg Bid, QUINAPRIL 40mg /d; and back on LASIX 40mg AM, KCL , & ACETAZOLAMIDE 250mg - 2tabsPM... she knows to avoid sodium etc...  ~  labs 2/09 showed Na143, K3.6, CL101, CO2 36, BUN 15, Cr0.9.Marland KitchenMarland Kitchen BNP was 194...  ~  labs 6/09 showed lytes normal x TCO2= 37, & BNP= 512...  ~  labs 12/09 showed HCO3- 36,  BNP= 586... rec- same meds, no salt!  ~  labs 6/10 showed Na= 146, K= 3.6, CO2= 36, BUN= 14, Creat= 0.9, BNP= 336.  ~  labs 9/10 showed Na-147, K=3.7, HCO3=36, BUN=18, Creat=0.9, BNP=484  ~  labs 1/11 showed Na=144, K=3.9, HCO3=35, BUN=17, Creat=0.9, BNP=488  ~  [facility] 4/11 & meds changed by TH> off diuretics & KCl.  ~  labs 10/15/09 showed Na=143, K=4.0, HCO3=37, BUN=12, Creat=0.8, BNP=761... restart  Lasix, Diamox, KCl (one each).  ~  labs 10/25/09 showed HCO3=44, BNP=588... rec> incr Diamox to 2Qpm...  ~  labs 8/11 showed HCO3=39, BNP=481... continue same.  ATRIAL FIBRILLATION (ICD-427.31) & SICK SINUS/ TACHY-BRADY SYNDROME (ICD-427.81) - new prob 1/10 w/ eval by DrKlein- rate control strategy on Propranolol & Norvasc w/ [facility]mad[facility].  ~  EKG 1/10 showed AFib, rate ~ 60, NSSTTWA...  ~  2DEcho 3/10 showed norm LVF w/ EF= 60% & no regional wall motion abn, mild calcif AoV w/ mild AI, mild LA & RA dil w/ incr thickness of interatrial septum c/w lipomatous hypertrophy, PA sys est at 50...  ~  Holter monitor 3/10 = AFib w/ rate  45-97...  VENOUS INSUFFICIENCY (ICD-459.81) - mild persist edema despite the sodium restriction, Lasix and Diamox... reviewed recommendation for No Salt, Elevation, TED's, etc...  DIABETES MELLITUS, BORDERLINE (ICD-790.29) - prev on Metformin 500mg /d (held after 4/11 hosp)...  ~  last BS=181, HgA1c=6.4 in 11/08...  ~  labs 6/09 showed BS= 101, HgA1c= 6.4  ~  labs 12/09 showed BS= 139,  HgA1c= 6.1  ~  labs 6/10 showed BS= 123, A1c= 6.1  ~  7/10: neg ophthal f/u DrHecker- no retinopathy, no macular edema.  ~  labs 9/10 showed BS= 93  ~  labs 1/11 showed BS=146, A1c=5.7  ~  labs 5/11 showed BS= 100 on diet alone...  ~  labs 8/11 showed BS= 96  DIVERTICULOSIS OF COLON, IRRITABLE BOWEL SYNDROME & COLONIC POLYPS (ICD-211.3)  ~  colonoscopy was 11/06 by DrStark showing divertics and several polyps (one 20mm adenoma)... f/u 56yrs.  ~  colonoscopy 1/10 by DrStark showed divertics (severe in sigmoid), 4mm polyp= tub adenoma... f/u 3 yrs.  BILAT BREAST CANCERS - resected via mastectomies in 1996 and 2002... followed by [facility] annually & seen 12/10.  DEGENERATIVE JOINT DISEASE (ICD-715.90) - w/ hx hyperuricemia on ALLOPURINOL 300mg /d...  Hx of HEADACHE (ICD-784.0)  ANXIETY (ICD-300.00) - on ALPRAZOLAM 0.5mg  Tid Prn...  ~  Remeron 7.5mg /d started 4/11 hosp & stopped 5/11 due to lethargy, sleepiness...   Preventive Screening-Counseling & Management  Alcohol-Tobacco     Smoking Status: never  Allergies: 1)  ! * Ivp Dye  Comments:  Nurse/Medical Assistant: The patient's medications and allergies were reviewed with the patient and were updated in the Medication and Allergy Lists.  Past History:  Past Medical History: ALLERGIC RHINITIS (ICD-477.9) Hx of HYPOXEMIA (ICD-799.02) HYPERTENSION (ICD-401.9) ATRIAL FIBRILLATION (ICD-427.31) SICK SINUS/ TACHY-BRADY SYNDROME (ICD-427.81) VENOUS INSUFFICIENCY (ICD-459.81) DIABETES MELLITUS, BORDERLINE (ICD-790.29) DIVERTICULOSIS OF COLON  (ICD-562.10) IRRITABLE BOWEL SYNDROME (ICD-564.1) COLONIC POLYPS (ICD-211.3) ADENOCARCINOMA, BREAST, BILATERAL, HX OF (ICD-V10.3) DEGENERATIVE JOINT DISEASE (ICD-715.90) Hx of HEADACHE (ICD-784.0) ANXIETY (ICD-300.00)  Past Surgical History: Cataract extraction Cholecystectomy S/P right mastectomy for breast cancer in 1996 S/P left mastectomy for cancer 1/02 by [facility] S/P Right TKR by DrRendall 6/04  Family History: Reviewed history from 11/24/2007 and no changes required. mother died at 81 of heart attack father died at 17 yrs of bleeding ulcer sister has kidney cancer brother age 35 has had open heart surgery  1 brother alive and well age 28 family history of asthma in patient's brother, and cancer in sister, daughter has breast cancer  Social History: Reviewed history from 11/24/2007 and no changes required. 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  The patient denies anorexia, fever, weight loss, weight gain, vision loss, hoarseness, chest pain, syncope, prolonged cough, headaches, hemoptysis, abdominal pain, melena, hematochezia, severe indigestion/heartburn, hematuria, incontinence, muscle weakness, suspicious skin lesions, transient blindness, depression, unusual weight change, abnormal bleeding, enlarged lymph nodes, and angioedema.    Vital Signs:  Patient profile:   77 year old female Height:      63 inches Weight:      160.25 pounds BMI:     28.49 O2 Sat:      93 % on 2 L/min Temp:     98.5 degrees F oral Pulse rate:   74 / minute BP sitting:   120 / 80  (left arm) Cuff size:   regular  Vitals Entered By: Randell Loop CMA (January 11, 2010 3:03 PM)  O2 Sat at Rest %:  93 O2 Flow:  2 L/min  CC: 2 month ROV & review of mult medical problems... Is Patient Diabetic?  Yes Pain Assessment Patient in pain? no      Comments meds updated today with pt and her daughter---they brought all meds today   Physical Exam  Additional Exam:  WD, WN, 77 y/o BF in NAD... GENERAL:  Alert & oriented; pleasant & cooperative... HEENT:  [place]/AT, EOM-wnl, PERRLA, EACs-clear, TMs-wnl, NOSE-clear, THROAT-clear & wnl. NECK:  Supple w/ fairROM; no JVD; normal carotid impulses w/o bruits; no thyromegaly or nodules palpated; no lymphadenopathy. CHEST:  Clear to P & A; without wheezes/ rales/ or rhonchi heard... HEART:  irregular rhythm; gr 1/6 SEM without rubs or gallops appreciated... ABDOMEN:  Soft & nontender; normal bowel sounds; no organomegaly or masses detected. EXT: without deformities, mild arthritic changes; no varicose veins/ +venous insuffic/ 1-2+edema. NEURO:  CN's intact; no focal neuro deficits... DERM:  No lesions noted; no rash etc...    MISC. Report  Procedure date:  01/11/2010  Findings:      BMP (METABOL)   Sodium                    145 mEq/L                   135-145   Potassium                 3.9 mEq/L                   3.5-5.1   Chloride                  99 mEq/L                    96-112   Carbon Dioxide       [H]  39 mEq/L                    19-32   Glucose                   96 mg/dL                    04-54   BUN                       15 mg/dL                    0-98   Creatinine  0.7 mg/dL                   1.6-1.0   Calcium                   9.1 mg/dL                   9.6-04.5   GFR                       104.73 mL/min               >60  B-Type Natiuretic Peptide (BNPR)  B-Type Natriuetic Peptide                        [H]  481.0 pg/mL                 0.0-100.0   Impression & Recommendations:  Problem # 1:  Hx of HYPOXEMIA (ICD-799.02) She would like to change to Liq Oxygen & we will contact [facility] to make this switch...  Orders: DME Referral (DME)  Problem # 2:  HYPERTENSION (ICD-401.9) BP well controlled on meds>   continue same. Her updated medication list for this problem includes:    Propranolol Hcl 80 Mg Tabs (Propranolol hcl) .Marland Kitchen... Take 1  tablet by mouth two times a day    Quinapril Hcl 40 Mg Tabs (Quinapril hcl) .Marland Kitchen... Take 1 tablet by mouth once a day    Lasix 40 Mg Tabs (Furosemide) .Marland Kitchen... Take 1 tablet once daily in am...    Acetazolamide 250 Mg Tabs (Acetazolamide) .Marland Kitchen... Take 2 tabs by mouth once daily at 4 pm...  Orders: TLB-BMP (Basic Metabolic Panel-BMET) (80048-METABOL) TLB-BNP (B-Natriuretic Peptide) (83880-BNPR)  Problem # 3:  ATRIAL FIBRILLATION (ICD-427.31) Chr AFib w/ Coumadin Rx & rate control strategy>  stable. Her updated medication list for this problem includes:    Aspirin 81 Mg Tbec (Aspirin) .Marland Kitchen... Take 1 tab by mouth once daily...    Warfarin Sodium 5 Mg Tabs (Warfarin sodium) ..... Use as directed by anticoagulation clinic    Propranolol Hcl 80 Mg Tabs (Propranolol hcl) .Marland Kitchen... Take 1  tablet by mouth two times a day  Problem # 4:  VENOUS INSUFFICIENCY (ICD-459.81) We had a long discussion w/ pt/ daughter/ & myself about 2 gm sodium diet etc...  Problem # 5:  DIABETES MELLITUS, BORDERLINE (ICD-790.29) Borderline only>  diet controlled... Orders: TLB-BMP (Basic Metabolic Panel-BMET) (80048-METABOL) TLB-BNP (B-Natriuretic Peptide) (83880-BNPR)  Problem # 6:  IRRITABLE BOWEL SYNDROME (ICD-564.1) GI is stable>  continue same...  Problem # 7:  ADENOCARCINOMA, BREAST, BILATERAL, HX OF (ICD-V10.3) Still followed yearly by [facility]- DrNewman...  Problem # 8:  OTHER MEDICAL PROBLEMS AS NOTED>>>  Complete Medication List: 1)  Proair Hfa 108 (90 Base) Mcg/act Aers (Albuterol sulfate) .Marland Kitchen.. 1-2 puffs every 4-6 hours as needed 2)  Aspirin 81 Mg Tbec (Aspirin) .... Take 1 tab by mouth once daily.Marland KitchenMarland Kitchen 3)  Warfarin Sodium 5 Mg Tabs (Warfarin sodium) .... Use as directed by anticoagulation clinic 4)  Propranolol Hcl 80 Mg Tabs (Propranolol hcl) .... Take 1  tablet by mouth two times a  day 5)  Quinapril Hcl 40 Mg Tabs (Quinapril hcl) .... Take 1 tablet by mouth once a day 6)  Lasix 40 Mg Tabs (Furosemide) .... Take 1 tablet once daily in am... 7)  Acetazolamide 250 Mg Tabs (Acetazolamide) .... Take 2 tabs by mouth once daily at 4  pm... 8)  Klor-con M20 20 Meq Tbcr (Potassium chloride crys cr) .... Take 1 tab by mouth once daily.Marland KitchenMarland Kitchen 9)  Allopurinol 300 Mg Tabs (Allopurinol) .... Take 1 tablet by mouth once a day 10)  Multivitamins Tabs (Multiple vitamin) .... Take 1 tablet by mouth once a day 11)  Alprazolam 0.5 Mg Tabs (Alprazolam) .... As needed 12)  Meclizine Hcl 25 Mg Tabs (Meclizine hcl) .... Take one tablet by mouth every 4 hours prn 13)  Hycodan Syrup  .Marland Kitchen.. 1 tsp every 8 hours as needed cough  Patient Instructions: 1)  Today we updated your med list- see below.... 2)  Continue your current meds the same for now... 3)  Today we did your follow up blood work... please call the "phone tree" in a few days for your lab results.Marland KitchenMarland Kitchen 4)  Call for any problems.Marland KitchenMarland Kitchen 5)  Please schedule a follow-up appointment in 4 months.

## 2010-07-14 NOTE — Letter (Signed)
Summary: [facility]  [facility]   Imported By: Sherian Rein 01/23/2010 14:19:12  _____________________________________________________________________  External Attachment:    Type:   Image     Comment:   External Document

## 2010-07-25 ENCOUNTER — Ambulatory Visit (INDEPENDENT_AMBULATORY_CARE_PROVIDER_SITE_OTHER): Payer: Medicare Other | Admitting: Pulmonary Disease

## 2010-07-25 ENCOUNTER — Encounter: Payer: Self-pay | Admitting: Pulmonary Disease

## 2010-07-25 DIAGNOSIS — R7309 Other abnormal glucose: Secondary | ICD-10-CM

## 2010-07-25 DIAGNOSIS — I872 Venous insufficiency (chronic) (peripheral): Secondary | ICD-10-CM

## 2010-07-25 DIAGNOSIS — B354 Tinea corporis: Secondary | ICD-10-CM | POA: Insufficient documentation

## 2010-07-25 DIAGNOSIS — R0902 Hypoxemia: Secondary | ICD-10-CM

## 2010-07-25 DIAGNOSIS — M1990 Unspecified osteoarthritis, unspecified site: Secondary | ICD-10-CM

## 2010-07-25 DIAGNOSIS — I4891 Unspecified atrial fibrillation: Secondary | ICD-10-CM

## 2010-07-25 DIAGNOSIS — I10 Essential (primary) hypertension: Secondary | ICD-10-CM

## 2010-07-25 DIAGNOSIS — F411 Generalized anxiety disorder: Secondary | ICD-10-CM

## 2010-07-27 ENCOUNTER — Encounter (INDEPENDENT_AMBULATORY_CARE_PROVIDER_SITE_OTHER): Payer: Medicare Other

## 2010-07-27 ENCOUNTER — Encounter: Payer: Self-pay | Admitting: Internal Medicine

## 2010-07-27 DIAGNOSIS — I4891 Unspecified atrial fibrillation: Secondary | ICD-10-CM

## 2010-07-27 DIAGNOSIS — Z7901 Long term (current) use of anticoagulants: Secondary | ICD-10-CM

## 2010-08-02 NOTE — Assessment & Plan Note (Signed)
Summary: rov rescheduled from 1/26 nos//jd   Primary Care Provider:  Alroy Dust, MD  CC:  2 month ROV & add-on appt for rash....  History of Present Illness: 77 y/o BF here for a follow up visit... she has multiple medical problems as noted below>>>   ~  May11:  she was adm 4/27-28/11 w/ confusion, weakness w/ fall (no signif trauma), ?dehydration, depresssed over the passing of her sister... she's had some intermittent SOB, not resting well & daughter does not want sleeping pills due to prev reaction... they will try Tylenol PM & consider 1/2 of the Alpraz0.5mg ... protimes monitored in the CC now... on one Lasix & K20 Qam, & one Diamox Qpm> f/u labs shows HCO3=44, BNP=588... rec> incr the Diamox to 2 Qpm..  ~  Jun11:  she is improved- feeling better overall... resting better, appetite has returned, etc... weight 157# is down 10# from 1/11 and edema improved down to trace at present... BP controlled on meds;  chr AFib w/ rate control strategy & Coumadin via CC;  BS OK on diet alone...  we reviewed diuretic regimen of Lasix 40mg AM, KCl , Acetazolamide 250mg - 2tabsPM...  ~  Dec11:  she continues stable over the last few months-  BP controlled, AFib w/ rate control strategy & coumadin, otherw stable & she likes the Liq Oxygen w/ incr mobility... OK Flu shot today.   ~  July 25, 2010:   Pt noticed a rash on her left calf area 7 mentioned it to the staff at the coumadin clinic;  they told her to f/u w/ me &set up this appt for her;  she has a ringworm (tinea corporis) on her calf & we discussed this> rx w/ generic LOTRIMIN AF cream...     She reports otherw stable on her port O2, diuretic Rx & same meds (see below)>>>    Current Problem List:  ALLERGIC RHINITIS (ICD-477.9) - on FLONASE Qhs & ZYRTEK QAM Prn.  Hx of HYPOXEMIA (ICD-799.02) - full eval for hypoxemia in [id number]- CXR= cardiomegaly, ectatic Ao, pulm ven HTN... CTAngio= neg, without PE, min scarring in lingula... 2DEcho= norm  LVF w/ EF=55%, LVwall thickness at upper limits, mild ca++ AoV w/ mild AI,  PA sys pressure = ... O2 sats 90-94 at rest on RA... w/ ambulation in the office sats drop to 85%... she didn't yet want to go on oxygen at home... just occas SOB w/ activity- she is too sedentary and needs to incr her exercise program.  ~  6/10: c/o increased DOE & no energy- O2 sat= 91% rest & 86% w/activ... rec> home O2 1L/min rest, 2L/min exercise.  ~  8/11:  pt requesting change to LiqO2 to incr mobility.  HYPERTENSION (ICD-401.9) - controlled on PROPRANOLOL 80mg Bid, QUINAPRIL 40mg /d; and back on LASIX 40mg AM, KCL , & ACETAZOLAMIDE 250mg - 2tabsPM... she knows to avoid sodium etc...  ~  labs 2/09 showed Na143, K3.6, CL101, CO2 36, BUN 15, Cr0.9.Marland KitchenMarland Kitchen BNP was 194...  ~  labs 6/09 showed lytes normal x TCO2= 37, & BNP= 512...  ~  labs 12/09 showed HCO3- 36,  BNP= 586... rec- same meds, no salt!  ~  labs 6/10 showed Na= 146, K= 3.6, CO2= 36, BUN= 14, Creat= 0.9, BNP= 336.  ~  labs 9/10 showed Na-147, K=3.7, HCO3=36, BUN=18, Creat=0.9, BNP=484  ~  labs 1/11 showed Na=144, K=3.9, HCO3=35, BUN=17, Creat=0.9, BNP=488  ~  [facility] 4/11 & meds changed by TH> off diuretics & KCl.  ~  labs 10/15/09 showed  Na=143, K=4.0, HCO3=37, BUN=12, Creat=0.8, BNP=761... restart Lasix, Diamox, KCl (one each).  ~  labs 10/25/09 showed HCO3=44, BNP=588... rec> incr Diamox to 2Qpm...  ~  labs 8/11 showed HCO3=39, BNP=481... continue same.  ATRIAL FIBRILLATION (ICD-427.31) & SICK SINUS/ TACHY-BRADY SYNDROME (ICD-427.81) - new prob 1/10 w/ eval by DrKlein- rate control strategy on Propranolol w/ Coumadin via the [facility].  ~  EKG 1/10 showed AFib, rate ~ 60, NSSTTWA...  ~  2DEcho 3/10 showed norm LVF w/ EF= 60% & no regional wall motion abn, mild calcif AoV w/ mild AI, mild LA & RA dil w/ incr thickness of interatrial septum c/w lipomatous hypertrophy, PA sys est at 50...  ~  Holter monitor 3/10 = AFib w/ rate 45-97...  VENOUS  INSUFFICIENCY (ICD-459.81) - mild persist edema despite the sodium restriction, Lasix and Diamox... reviewed recommendation for No Salt, Elevation, TED's, etc...  DIABETES MELLITUS, BORDERLINE (ICD-790.29) - prev on Metformin 500mg /d (held after 4/11 hosp)...  ~  last BS=181, HgA1c=6.4 in 11/08...  ~  labs 6/09 showed BS= 101, HgA1c= 6.4  ~  labs 12/09 showed BS= 139,  HgA1c= 6.1  ~  labs 6/10 showed BS= 123, A1c= 6.1  ~  7/10: neg ophthal f/u DrHecker- no retinopathy, no macular edema.  ~  labs 9/10 showed BS= 93  ~  labs 1/11 showed BS=146, A1c=5.7  ~  labs 5/11 showed BS= 100 on diet alone...  ~  labs 8/11 showed BS= 96  DIVERTICULOSIS OF COLON, IRRITABLE BOWEL SYNDROME & COLONIC POLYPS (ICD-211.3)  ~  colonoscopy was 11/06 by DrStark showing divertics and several polyps (one 20mm adenoma)... f/u 43yrs.  ~  colonoscopy 1/10 by DrStark showed divertics (severe in sigmoid), 4mm polyp= tub adenoma... f/u 3 yrs.  BILAT BREAST CANCERS - resected via mastectomies in 1996 and 2002... followed by [facility] annually & seen 12/10.  DEGENERATIVE JOINT DISEASE (ICD-715.90) - w/ hx hyperuricemia on ALLOPURINOL 300mg /d...  Hx of HEADACHE (ICD-784.0)  ANXIETY (ICD-300.00) - prev rec to take Alpraz 0,5mg  Prn but she never filled the Rx.  ~  Remeron 7.5mg /d started 4/11 hosp & stopped 5/11 due to lethargy, sleepiness...   Preventive Screening-Counseling & Management  Alcohol-Tobacco     Smoking Status: never  Allergies: 1)  ! * Ivp Dye  Comments:  Nurse/Medical Assistant: The patient's medications and allergies were reviewed with the patient and were updated in the Medication and Allergy Lists.  Past History:  Past Medical History: ALLERGIC RHINITIS (ICD-477.9) Hx of HYPOXEMIA (ICD-799.02) HYPERTENSION (ICD-401.9) ATRIAL FIBRILLATION (ICD-427.31) SICK SINUS/ TACHY-BRADY SYNDROME (ICD-427.81) VENOUS INSUFFICIENCY (ICD-459.81) DIABETES MELLITUS, BORDERLINE (ICD-790.29) DIVERTICULOSIS  OF COLON (ICD-562.10) IRRITABLE BOWEL SYNDROME (ICD-564.1) COLONIC POLYPS (ICD-211.3) ADENOCARCINOMA, BREAST, BILATERAL, HX OF (ICD-V10.3) DEGENERATIVE JOINT DISEASE (ICD-715.90) Hx of HEADACHE (ICD-784.0) ANXIETY (ICD-300.00)  Past Surgical History: Cataract extraction Cholecystectomy S/P right mastectomy for breast cancer in 1996 S/P left mastectomy for cancer 1/02 by [facility] S/P Right TKR by DrRendall 6/04  Family History: Reviewed history from 11/24/2007 and no changes required. mother died at 38 of heart attack father died at 40 yrs of bleeding ulcer sister has kidney cancer brother age 24 has had open heart surgery  1 brother alive and well age 33 family history of asthma in patient's brother, and cancer in sister, daughter has breast cancer  Social History: Reviewed history from 01/11/2010 and no changes required. 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  The patient denies anorexia, fever, weight loss, weight gain, vision loss, decreased hearing, hoarseness, chest pain, syncope, prolonged cough, headaches, hemoptysis, abdominal pain, melena, hematochezia, severe indigestion/heartburn, hematuria, incontinence, muscle weakness, suspicious skin lesions, transient blindness, difficulty walking, depression, unusual weight change, abnormal bleeding, enlarged lymph nodes, and angioedema.    Vital Signs:  Patient profile:   77 year old female Height:      63 inches Weight:      163.38 pounds BMI:     29.05 O2 Sat:      96 % on 2 L/min Temp:     98.5 degrees F oral Pulse rate:   76 / minute BP sitting:   128 / 84  (left arm) Cuff size:   regular  Vitals Entered By: Randell Loop CMA (July 25, 2010 3:13 PM)  O2 Sat at Rest %:  96 O2 Flow:  2 L/min CC: 2 month ROV & add-on appt for rash... Is Patient Diabetic? No Pain  Assessment Patient in pain? no      Comments pt brought all of her meds today   Physical Exam  Additional Exam:  WD, WN, 77 y/o BF in NAD... GENERAL:  Alert & oriented; pleasant & cooperative... HEENT:  [place]/AT, EOM-wnl, PERRLA, EACs-clear, TMs-wnl, NOSE-clear, THROAT-clear & wnl. NECK:  Supple w/ fairROM; no JVD; normal carotid impulses w/o bruits; no thyromegaly or nodules palpated; no lymphadenopathy. CHEST:  Clear to P & A; without wheezes/ rales/ or rhonchi heard... HEART:  irregular rhythm; gr 1/6 SEM without rubs or gallops appreciated... ABDOMEN:  Soft & nontender; normal bowel sounds; no organomegaly or masses detected. EXT: without deformities, mild arthritic changes; no varicose veins/ +venous insuffic/ 1-2+edema. NEURO:  CN's intact; no focal neuro deficits... DERM:  There is a rounded lesion left calf c/w ringworm...    Impression & Recommendations:  Problem # 1:  TINEA CORPORIS (ICD-110.5) She appears to have a ringworm rash on left calf... discussed topical therapy w/ pt> try LOTRIMIN AF Cream topically two times a day.  Problem # 2:  Hx of HYPOXEMIA (ICD-799.02) Stable w/ her liq O2 sys> sat 92% on 2L/ min flow...  Problem # 3:  HYPERTENSION (ICD-401.9) Controlled>  same meds. Her updated medication list for this problem includes:    Propranolol Hcl 80 Mg Tabs (Propranolol hcl) .Marland Kitchen... Take 1  tablet by mouth two times a day    Quinapril Hcl 40 Mg Tabs (Quinapril hcl) .Marland Kitchen... Take 1 tablet by mouth once a day    Lasix 40 Mg Tabs (Furosemide) .Marland Kitchen... Take 1 tablet once daily in am...    Acetazolamide 250 Mg Tabs (Acetazolamide) .Marland Kitchen... Take 2 tabs by mouth once daily at 4 pm...  Problem # 4:  ATRIAL FIBRILLATION (ICD-427.31) Followed by drKlein>  on Coumadin & rate control strategy... Her updated medication list for this problem includes:    Aspirin 81 Mg Tbec (Aspirin) .Marland Kitchen... Take 1 tab by mouth once daily...    Warfarin Sodium 5 Mg Tabs (Warfarin sodium) ..... Use as  directed by anticoagulation clinic    Propranolol Hcl 80 Mg Tabs (Propranolol hcl) .Marland Kitchen... Take 1  tablet by mouth two times a day  Problem # 5:  DIABETES MELLITUS, BORDERLINE (ICD-790.29) BS has been fine on diet alone...  Problem # 6:  OTHER MEDICAL ISSUES AS NOTED>>>  Complete Medication List: 1)  Proair Hfa 108 (90 Base) Mcg/act Aers (Albuterol sulfate) .Marland Kitchen.. 1-2 puffs every 4-6 hours  as needed 2)  Aspirin 81 Mg Tbec (Aspirin) .... Take 1 tab by mouth once daily.Marland KitchenMarland Kitchen 3)  Warfarin Sodium 5 Mg Tabs (Warfarin sodium) .... Use as directed by anticoagulation clinic 4)  Propranolol Hcl 80 Mg Tabs (Propranolol hcl) .... Take 1  tablet by mouth two times a day 5)  Quinapril Hcl 40 Mg Tabs (Quinapril hcl) .... Take 1 tablet by mouth once a day 6)  Lasix 40 Mg Tabs (Furosemide) .... Take 1 tablet once daily in am... 7)  Acetazolamide 250 Mg Tabs (Acetazolamide) .... Take 2 tabs by mouth once daily at 4 pm... 8)  Klor-con M20 20 Meq Tbcr (Potassium chloride crys cr) .... Take 1 tab by mouth once daily.Marland KitchenMarland Kitchen 9)  Imodium Multi-symptom Relief 2-125 Mg Tabs (Loperamide-simethicone) .... As needed 10)  Allopurinol 300 Mg Tabs (Allopurinol) .... Take 1 tablet by mouth once a day 11)  Multivitamins Tabs (Multiple vitamin) .... Take 1 tablet by mouth once a day 12)  Meclizine Hcl 25 Mg Tabs (Meclizine hcl) .... Take one tablet by mouth every 4 hours prn 13)  Lotrimin Af 1 % Crea (Clotrimazole) .... Apply to rash two times a day as directed...  Patient Instructions: 1)  Today we updated your med list- see below.... 2)  We wrote a new perscription for LOTRIMIN AF cream- apply to the rash twice daily.Marland KitchenMarland Kitchen 3)  Call for any problems or concerns... 4)  Keep your prev sched follow up appt 10/24/10... Prescriptions: LOTRIMIN AF 1 % CREA (CLOTRIMAZOLE) apply to rash two times a day as directed...  #30gm tube x prn   Entered and Authorized by:   Michele Mcalpine MD   Signed by:   Michele Mcalpine MD on 07/25/2010   Method  used:   Print then Give to Patient   RxID:   [id number]    Immunization History:  Influenza Immunization History:    Influenza:  historical (06/26/2010)

## 2010-08-02 NOTE — Medication Information (Signed)
Summary: ccr/tmj  Anticoagulant Therapy  Managed by: Weston Brass, PharmD Referring MD: Sherryl Manges, MD PCP: Alroy Dust, MD Supervising MD: Tenny Craw MD, Gunnar Fusi Indication 1: Atrial Fibrillation Lab Used: [facility] [place] Site: [address] INR POC 3.2 INR RANGE 2.0 - 3.0  Dietary changes: no    Health status changes: no    Bleeding/hemorrhagic complications: yes       Details: Slight nose bleeds x2 when she blows her nose  Recent/future hospitalizations: no    Any changes in medication regimen? yes       Details: Lotrimin cream for ring worm  Recent/future dental: no  Any missed doses?: no       Is patient compliant with meds? yes       Allergies: 1)  ! * Ivp Dye  Anticoagulation Management History:      The patient is taking warfarin and comes in today for a routine follow up visit.  Positive risk factors for bleeding include an age of 5 years or older.  The bleeding index is 'intermediate risk'.  Positive CHADS2 values include History of HTN and Age > 68 years old.  Her last INR was 4.0 ratio.  Anticoagulation responsible provider: Tenny Craw MD, Gunnar Fusi.  INR POC: 3.2.  Cuvette Lot#: [id number].  Exp: 06/2011.    Anticoagulation Management Assessment/Plan:      The patient's current anticoagulation dose is Warfarin sodium 5 mg tabs: Use as directed by Anticoagulation Clinic.  The target INR is 2.0-3.0.  The next INR is due 08/16/2010.  Anticoagulation instructions were given to patient.  Results were reviewed/authorized by Weston Brass, PharmD.  She was notified by Margot Chimes PharmD Candidate.         Prior Anticoagulation Instructions: INR: 2.4 (goal 2-3)  Your INR is at goal today.  Continue taking 7.5 mg every day EXCEPT for Wednesdays.  Return in 4 weeks for another INR check.    Current Anticoagulation Instructions: INR 3.2  Only take 1 tablet tonight, then resume your normal dose of 1 1/2 tablets daily except on Wednesdays when you take 1 tablet.   Recheck INR in 3 weeks.

## 2010-08-16 ENCOUNTER — Encounter (INDEPENDENT_AMBULATORY_CARE_PROVIDER_SITE_OTHER): Payer: Medicare Other

## 2010-08-16 ENCOUNTER — Encounter: Payer: Self-pay | Admitting: Internal Medicine

## 2010-08-16 DIAGNOSIS — Z7901 Long term (current) use of anticoagulants: Secondary | ICD-10-CM

## 2010-08-16 DIAGNOSIS — I4891 Unspecified atrial fibrillation: Secondary | ICD-10-CM

## 2010-08-22 NOTE — Medication Information (Signed)
Summary: rov/tm  Anticoagulant Therapy  Managed by: Weston Brass, PharmD Referring MD: Sherryl Manges, MD PCP: Alroy Dust, MD Supervising MD: Graciela Husbands MD, Viviann Spare Indication 1: Atrial Fibrillation Lab Used: [facility] [place] Site: [address] INR POC 3.4 INR RANGE 2.0 - 3.0  Dietary changes: yes       Details: cut down on green vegetables since last visit          Allergies: 1)  ! * Ivp Dye  Anticoagulation Management History:      The patient is taking warfarin and comes in today for a routine follow up visit.  Positive risk factors for bleeding include an age of 75 years or older.  The bleeding index is 'intermediate risk'.  Positive CHADS2 values include History of HTN and Age > 72 years old.  Her last INR was 4.0 ratio.  Anticoagulation responsible provider: Graciela Husbands MD, Viviann Spare.  INR POC: 3.4.  Cuvette Lot#: [id number].  Exp: 06/2011.    Anticoagulation Management Assessment/Plan:      The patient's current anticoagulation dose is Warfarin sodium 5 mg tabs: Use as directed by Anticoagulation Clinic.  The target INR is 2.0-3.0.  The next INR is due 09/06/2010.  Anticoagulation instructions were given to patient.  Results were reviewed/authorized by Weston Brass, PharmD.  She was notified by Weston Brass PharmD.         Prior Anticoagulation Instructions: INR 3.2  Only take 1 tablet tonight, then resume your normal dose of 1 1/2 tablets daily except on Wednesdays when you take 1 tablet.  Recheck INR in 3 weeks.   Current Anticoagulation Instructions: INR 3.4  Skip today's dose of Coumadin then decrease dose to 1 1/2 tablets every day except 1 tablet on Sunday and Wednesday.  Recheck INR in 3 weeks.

## 2010-08-29 LAB — URINE MICROSCOPIC-ADD ON

## 2010-08-29 LAB — LIPID PANEL
Total CHOL/HDL Ratio: 3.8 RATIO
VLDL: 20 mg/dL (ref 0–40)

## 2010-08-29 LAB — CARDIAC PANEL(CRET KIN+CKTOT+MB+TROPI)
CK, MB: 6.4 ng/mL (ref 0.3–4.0)
CK, MB: 8.7 ng/mL (ref 0.3–4.0)
Relative Index: 1.5 (ref 0.0–2.5)
Relative Index: 1.8 (ref 0.0–2.5)
Total CK: 472 U/L — ABNORMAL HIGH (ref 7–177)
Total CK: 506 U/L — ABNORMAL HIGH (ref 7–177)
Troponin I: 0.02 ng/mL (ref 0.00–0.06)
Troponin I: 0.02 ng/mL (ref 0.00–0.06)
Troponin I: 0.02 ng/mL (ref 0.00–0.06)

## 2010-08-29 LAB — DIFFERENTIAL
Basophils Relative: 0 % (ref 0–1)
Eosinophils Absolute: 0.1 10*3/uL (ref 0.0–0.7)
Monocytes Relative: 8 % (ref 3–12)
Neutrophils Relative %: 79 % — ABNORMAL HIGH (ref 43–77)

## 2010-08-29 LAB — URINE CULTURE
Colony Count: NO GROWTH
Culture: NO GROWTH

## 2010-08-29 LAB — URINALYSIS, ROUTINE W REFLEX MICROSCOPIC
Bilirubin Urine: NEGATIVE
Glucose, UA: NEGATIVE mg/dL
Ketones, ur: NEGATIVE mg/dL
Nitrite: NEGATIVE
Nitrite: NEGATIVE
Protein, ur: NEGATIVE mg/dL
Urobilinogen, UA: 1 mg/dL (ref 0.0–1.0)
pH: 6 (ref 5.0–8.0)

## 2010-08-29 LAB — PROTIME-INR
INR: 2.44 — ABNORMAL HIGH (ref 0.00–1.49)
INR: 2.83 — ABNORMAL HIGH (ref 0.00–1.49)

## 2010-08-29 LAB — RPR: RPR Ser Ql: NONREACTIVE

## 2010-08-29 LAB — COMPREHENSIVE METABOLIC PANEL
ALT: 24 U/L (ref 0–35)
Albumin: 3.3 g/dL — ABNORMAL LOW (ref 3.5–5.2)
Alkaline Phosphatase: 63 U/L (ref 39–117)
BUN: 20 mg/dL (ref 6–23)
CO2: 34 mEq/L — ABNORMAL HIGH (ref 19–32)
Chloride: 102 mEq/L (ref 96–112)
Chloride: 110 mEq/L (ref 96–112)
Creatinine, Ser: 0.83 mg/dL (ref 0.4–1.2)
GFR calc non Af Amer: 49 mL/min — ABNORMAL LOW (ref >60)
Glucose, Bld: 140 mg/dL — ABNORMAL HIGH (ref 70–99)
Glucose, Bld: 144 mg/dL — ABNORMAL HIGH (ref 70–99)
Potassium: 4.5 mEq/L (ref 3.5–5.1)
Sodium: 141 mEq/L (ref 135–145)
Total Bilirubin: 0.9 mg/dL (ref 0.3–1.2)
Total Protein: 6.3 g/dL (ref 6.0–8.3)
Total Protein: 7 g/dL (ref 6.0–8.3)

## 2010-08-29 LAB — GLUCOSE, CAPILLARY
Glucose-Capillary: 122 mg/dL — ABNORMAL HIGH (ref 70–99)
Glucose-Capillary: 149 mg/dL — ABNORMAL HIGH (ref 70–99)

## 2010-08-29 LAB — CBC
Hemoglobin: 11.2 g/dL — ABNORMAL LOW (ref 12.0–15.0)
Hemoglobin: 12.1 g/dL (ref 12.0–15.0)
MCHC: 31.1 g/dL (ref 30.0–36.0)
RBC: 3.65 MIL/uL — ABNORMAL LOW (ref 3.87–5.11)
RBC: 3.97 MIL/uL (ref 3.87–5.11)
WBC: 8.6 10*3/uL (ref 4.0–10.5)

## 2010-08-29 LAB — VITAMIN B12: Vitamin B-12: 670 pg/mL (ref 211–911)

## 2010-08-29 LAB — HEMOGLOBIN A1C: Mean Plasma Glucose: 114 mg/dL (ref <117)

## 2010-08-29 LAB — TSH: TSH: 1.874 u[IU]/mL (ref 0.350–4.500)

## 2010-09-06 ENCOUNTER — Encounter: Payer: Medicare Other | Admitting: *Deleted

## 2010-09-07 ENCOUNTER — Ambulatory Visit (INDEPENDENT_AMBULATORY_CARE_PROVIDER_SITE_OTHER): Payer: Medicare Other | Admitting: *Deleted

## 2010-09-07 DIAGNOSIS — I4891 Unspecified atrial fibrillation: Secondary | ICD-10-CM

## 2010-09-07 DIAGNOSIS — Z7901 Long term (current) use of anticoagulants: Secondary | ICD-10-CM | POA: Insufficient documentation

## 2010-09-07 NOTE — Patient Instructions (Signed)
Continue on same dosage 1.5 tablets daily except 1 tablet on Wednesdays and Sundays.  Recheck in 4 weeks.

## 2010-09-25 LAB — GLUCOSE, CAPILLARY: Glucose-Capillary: 112 mg/dL — ABNORMAL HIGH (ref 70–99)

## 2010-09-30 ENCOUNTER — Other Ambulatory Visit: Payer: Self-pay | Admitting: Pulmonary Disease

## 2010-10-04 ENCOUNTER — Other Ambulatory Visit: Payer: Self-pay | Admitting: *Deleted

## 2010-10-04 MED ORDER — ALLOPURINOL 300 MG PO TABS: 300.0000 mg | ORAL_TABLET | Freq: Every day | ORAL | Status: DC

## 2010-10-04 MED ORDER — FUROSEMIDE 40 MG PO TABS: 40.0000 mg | ORAL_TABLET | Freq: Every day | ORAL | Status: DC

## 2010-10-05 ENCOUNTER — Ambulatory Visit (INDEPENDENT_AMBULATORY_CARE_PROVIDER_SITE_OTHER): Payer: Medicare Other | Admitting: *Deleted

## 2010-10-05 DIAGNOSIS — I4891 Unspecified atrial fibrillation: Secondary | ICD-10-CM

## 2010-10-05 LAB — POCT INR: INR: 2.3

## 2010-10-13 ENCOUNTER — Encounter (INDEPENDENT_AMBULATORY_CARE_PROVIDER_SITE_OTHER): Payer: Self-pay | Admitting: Surgery

## 2010-10-20 ENCOUNTER — Encounter: Payer: Self-pay | Admitting: Pulmonary Disease

## 2010-10-24 ENCOUNTER — Other Ambulatory Visit: Payer: Self-pay | Admitting: Internal Medicine

## 2010-10-24 ENCOUNTER — Encounter: Payer: Self-pay | Admitting: Pulmonary Disease

## 2010-10-24 ENCOUNTER — Ambulatory Visit (INDEPENDENT_AMBULATORY_CARE_PROVIDER_SITE_OTHER): Payer: Medicare Other | Admitting: Pulmonary Disease

## 2010-10-24 ENCOUNTER — Other Ambulatory Visit (INDEPENDENT_AMBULATORY_CARE_PROVIDER_SITE_OTHER): Payer: Medicare Other

## 2010-10-24 DIAGNOSIS — E785 Hyperlipidemia, unspecified: Secondary | ICD-10-CM

## 2010-10-24 DIAGNOSIS — R7309 Other abnormal glucose: Secondary | ICD-10-CM

## 2010-10-24 DIAGNOSIS — I10 Essential (primary) hypertension: Secondary | ICD-10-CM

## 2010-10-24 DIAGNOSIS — I4891 Unspecified atrial fibrillation: Secondary | ICD-10-CM

## 2010-10-24 DIAGNOSIS — R0609 Other forms of dyspnea: Secondary | ICD-10-CM

## 2010-10-24 DIAGNOSIS — F411 Generalized anxiety disorder: Secondary | ICD-10-CM

## 2010-10-24 DIAGNOSIS — K5730 Diverticulosis of large intestine without perforation or abscess without bleeding: Secondary | ICD-10-CM

## 2010-10-24 DIAGNOSIS — K589 Irritable bowel syndrome without diarrhea: Secondary | ICD-10-CM

## 2010-10-24 DIAGNOSIS — Z853 Personal history of malignant neoplasm of breast: Secondary | ICD-10-CM

## 2010-10-24 DIAGNOSIS — I872 Venous insufficiency (chronic) (peripheral): Secondary | ICD-10-CM

## 2010-10-24 DIAGNOSIS — M1990 Unspecified osteoarthritis, unspecified site: Secondary | ICD-10-CM

## 2010-10-24 LAB — CBC WITH DIFFERENTIAL/PLATELET
Basophils Absolute: 0 10*3/uL (ref 0.0–0.1)
Eosinophils Relative: 4.5 % (ref 0.0–5.0)
HCT: 37 % (ref 36.0–46.0)
Hemoglobin: 11.9 g/dL — ABNORMAL LOW (ref 12.0–15.0)
Lymphocytes Relative: 13.9 % (ref 12.0–46.0)
Lymphs Abs: 1 10*3/uL (ref 0.7–4.0)
Monocytes Relative: 7.8 % (ref 3.0–12.0)
Neutro Abs: 5.5 10*3/uL (ref 1.4–7.7)
RDW: 15.3 % — ABNORMAL HIGH (ref 11.5–14.6)
WBC: 7.5 10*3/uL (ref 4.5–10.5)

## 2010-10-24 LAB — LIPID PANEL
Cholesterol: 148 mg/dL (ref 0–200)
HDL: 53.5 mg/dL (ref >39.00)
Triglycerides: 74 mg/dL (ref 0.0–149.0)
VLDL: 14.8 mg/dL (ref 0.0–40.0)

## 2010-10-24 LAB — HEMOGLOBIN A1C: Hgb A1c MFr Bld: 5.8 % (ref 4.6–6.5)

## 2010-10-24 LAB — BASIC METABOLIC PANEL
BUN: 19 mg/dL (ref 6–23)
CO2: 35 mEq/L — ABNORMAL HIGH (ref 19–32)
Calcium: 9.1 mg/dL (ref 8.4–10.5)
Creatinine, Ser: 1 mg/dL (ref 0.4–1.2)
GFR: 70.56 mL/min (ref >60.00)
Glucose, Bld: 113 mg/dL — ABNORMAL HIGH (ref 70–99)

## 2010-10-24 LAB — TSH: TSH: 1.83 u[IU]/mL (ref 0.35–5.50)

## 2010-10-24 LAB — HEPATIC FUNCTION PANEL
Albumin: 3.6 g/dL (ref 3.5–5.2)
Alkaline Phosphatase: 58 U/L (ref 39–117)
Total Protein: 6.5 g/dL (ref 6.0–8.3)

## 2010-10-24 NOTE — Letter (Signed)
August 03, 2008    Lonzo Cloud. Kriste Basque, MD  [address]  [place], [place] [address]   RE:  AVALINE, STILLSON  MRN:  [id number]  /  DOB:  [birth year]   Dear Dr. Kriste Basque:   It was a pleasure to see Honesti Seaberg at your request because of an  abnormal  electrocardiogram which shows left ventricular hypertrophy.  This was first noted when she went for colonoscopy.  It was confirmed  with an electrocardiogram in your office a couple of days later.  It was  something of which the patient was unaware.  She has specifically no  palpitations.  She may have noted increasing difficulties with shortness  of breath although she is not sure if there has been a little bit of  edema, but this has not been notably worse either.  She does not have  nocturnal dyspnea.  She does sleep on 2 pillows but not chronically.   She has had no problems with chest pain.   Her thromboembolic risk factors are notable for hypertension, age,  diabetes, gender.  They are negative for TIA, congestive heart failure,  and peripheral vascular disease.   Her cardiovascular review of systems is further notable for .   PAST SURGICAL HISTORY:  Notable for bilateral mastectomies,  cholecystectomy, and a right total knee replacement.   REVIEW OF SYSTEMS:  She has had a 25-pound weight loss over the last 18  months associated with anorexia.  She describes reactive airway disease  and that her recent shortness of breath was improved with the  bronchodilator that you gave her.  She has diverticulosis, arthritis,  and history of headaches.  Review of systems apart from the above is  otherwise negative.   MEDICATIONS:  1. Aspirin 81.  2. Propranolol 80.  3. Diltiazem 180.  4. Lisinopril 40.  5. Lasix 80.  6. Nexium 20 b.i.d.  7. Acetazolamide  500 q.p.m.  8. Metformin 500.  9. Allopurinol 300 and indomethacin, this we are going to stop.  10.Xanax 0.5 p.r.n.  11.Zyrtec.  12.Avapro.   ALLERGIES:  Allergic to IVP  DYE.   FAMILY HISTORY:  Notable for breast cancer.  Her daughter has breast  cancer.  Her grandchildren have not been screened.   SOCIAL HISTORY:  She is a retired Runner, broadcasting/film/video.  She is widow.  She has 3  children.  She denies use of alcohol or recreational drugs.  Does not  smoke.   PHYSICAL EXAMINATION:  GENERAL:  She is an elderly African American  female who appeared her stated age of 5.  VITAL SIGNS:  Her weight was 165 and it was noted in 04/2006, it was  192; her blood pressure is 130/80, her pulse is 60.  HEENT:  No icterus or xanthomata.  NECK:  Her neck veins are 7-8 cm.  Her carotids were brisk and full  bilaterally without bruits.  BACK:  Without kyphosis or scoliosis.  LUNGS:  Clear.  HEART:  Sounds were irregular with a 2/6 murmur.  ABDOMEN:  Soft with active bowel sounds.  It was protuberant.  EXTREMITIES:  Palpable 2+, distal pulses were trace, there was 1+ edema.  NEUROLOGIC:  Grossly normal.  SKIN:  Warm and dry.   Electrocardiogram from your office the other day I just got which I  appreciate you sending over, demonstrated atrial fibrillation at 57 with  0.11/0.46.  There were minor ST-T changes in the lateral leads.   IMPRESSION:  1. Atrial fibrillation -  persistent.  2. Mild shortness of breath potentially related to atrial      fibrillation.  3. Thromboembolic risk factors notable for:      a.     Age.      b.     Hypertension.      c.     Diabetes.      d.     Gender.  4. Previously with normal left ventricular function.  5. History of breast cancer.   Ms. Burbano has atrial fibrillation which is persisting.  It is not  clear to me that it is particularly symptomatic.  I thought a Holter  monitor would be helpful in this regard to try to clarify heart beat  excursion if she is going very fast when she walked and likely to  account for the modest increase in  her shortness of breath.  The fact  that it has responded to bronchodilators makes it a little  bit uncertain  as to whether it is related to atrial fibrillation and perhaps a BNP  would help [place] in this regard.   I have also arranged for her to get an echo so we can look at her LV  systolic function ; some years ago, it looked quite normal.   Potential causative relationship could include hypothyroidism and  anemia, and we will need to track down her most recent blood work prior  to seeing her again in 3 weeks to see what if any of that needs to be  repeated.   We had a lengthy discussion regarding the physiology of impaired  exercise performance with atrial fibrillation, I am not sure how much of  it she understood.  She asked if we could sit down again with her  daughter in a couple of weeks to review it again.  Then I said, I would  be glad to do so.   This also particularly pertained to the discussion regarding  anticoagulation.  She has a CHADS  score of 3 which would give her a  somewhere in the range of 5 to 10% and for this we want to be doing  clearly the right thing.  We are part of another anticoagulation trial  called the Engage AF trial.  I brought this up with her and  she asked  again if I could talk about this with her daughter when they come back  and I said I would be glad to.   Thank you very much for asking [place] to see her.  We look forward to trying  to track down the other laboratories and reviewing the aforementioned  testing with her when she returns.    Sincerely,      Duke Salvia, MD, [facility]  Electronically Signed   SCK/MedQ  DD: 08/03/2008  DT: 08/04/2008  Job #: [id number]

## 2010-10-24 NOTE — Letter (Signed)
August 17, 2008     RE:  Cheyenne [name], Cheyenne [name]  MRN:  [id number]  /  DOB:  1926/04/05   To Whom It May Concern:   Cheyenne [name] has a history of atrial fibrillation with both evidence  of tachycardia as well as bradycardia most recently with pauses of  greater than 3 seconds.  We have adjusted her medications in the hopes  of trying to find a medical regime that will adequately treat the  tachycardia and bradycardia without the need for invasive pacemaker  implantation.   To that end, we will need a series of probably 2 more Holter monitors to  try to see if we can target the right drug regime.   I thank you in advance for consideration of this request.    Sincerely,      Duke Salvia, MD, [facility]  Electronically Signed    SCK/MedQ  DD: 08/17/2008  DT: 08/17/2008  Job #: [id number]

## 2010-10-24 NOTE — Progress Notes (Signed)
Subjective:    Patient ID: Cheyenne [name], female    DOB: [birth year], 77 y.o.   MRN: [id number]  HPI 77 y/o BF here for a follow up visit... she has multiple medical problems as noted below>>>  ~  May11:  she was adm 4/27-28/11 w/ confusion, weakness w/ fall (no signif trauma), ?dehydration, depresssed over the passing of her sister... she's had some intermittent SOB, not resting well & daughter does not want sleeping pills due to prev reaction... they will try Tylenol PM & consider 1/2 of the Alpraz0.5mg ... protimes monitored in the CC now... on one Lasix & K20 Qam, & one Diamox Qpm> f/u labs shows HCO3=44, BNP=588... rec> incr the Diamox to 2 Qpm.. ~  Jun11:  she is improved- feeling better overall... resting better, appetite has returned, etc... weight 157# is down 10# from 1/11 and edema improved down to trace at present... BP controlled on meds;  chr AFib w/ rate control strategy & Coumadin via CC;  BS OK on diet alone...  we reviewed diuretic regimen of Lasix 40mg AM, KCl , Acetazolamide 250mg - 2tabsPM... ~  Dec11:  she continues stable over the last few months-  BP controlled, AFib w/ rate control strategy & coumadin, otherw stable & she likes the Liq Oxygen w/ incr mobility... OK Flu shot today.  ~  July 25, 2010:   Pt noticed a rash on her left calf area & mentioned it to the staff at the coumadin clinic;  they told her to f/u w/ me & set up this appt for her;  she has a ringworm (tinea corporis) on her calf & we discussed this> rx w/ generic LOTRIMIN AF cream...  She reports otherw stable on her port O2, diuretic Rx & same meds (see below)>>>  ~  Oct 24, 2010:  38mo ROV & everything appears stable> he CC is "gas" & we discussed anti-gas Rx w/ Mylicon/ Simethacone/ Phazyme;  Breathing stable on rx w/ O2, Proair, Diamox diuretic;  BP controlled- denies CP, palpit, ch in SOb or edema;  Stable AFib on rate control strategy;  DM well controlled on diet alone;  Other problems as noted  below>  Labs today look good!         Problem List:  ALLERGIC RHINITIS (ICD-477.9) - on FLONASE Qhs & ZYRTEK QAM Prn.  Hx of HYPOXEMIA (ICD-799.02) - full eval for hypoxemia in [id number]- CXR= cardiomegaly, ectatic Ao, pulm ven HTN... CTAngio= neg, without PE, min scarring in lingula... 2DEcho= norm LVF w/ EF=55%, LVwall thickness at upper limits, mild ca++ AoV w/ mild AI,  PA sys pressure = ... O2 sats 90-94 at rest on RA... w/ ambulation in the office sats drop to 85%... she didn't yet want to go on oxygen at home... just occas SOB w/ activity- she is too sedentary and needs to incr her exercise program. ~  6/10: c/o increased DOE & no energy- O2 sat= 91% rest & 86% w/activ... rec> home O2 1L/min rest, 2L/min exercise. ~  8/11:  pt requesting change to LiqO2 to incr mobility.  HYPERTENSION (ICD-401.9) - controlled on PROPRANOLOL 80mg Bid, QUINAPRIL 40mg /d; and back on LASIX 40mg AM, KCL , & ACETAZOLAMIDE 250mg - 2tabsPM... she knows to avoid sodium etc... ~  labs 2/09 showed Na143, K3.6, CL101, CO2 36, BUN 15, Cr0.9.Marland KitchenMarland Kitchen BNP was 194... ~  labs 6/09 showed lytes normal x TCO2= 37, & BNP= 512... ~  labs 12/09 showed HCO3- 36,  BNP= 586... rec- same meds, no salt! ~  labs 6/10 showed  Na= 146, K= 3.6, CO2= 36, BUN= 14, Creat= 0.9, BNP= 336. ~  labs 9/10 showed Na-147, K=3.7, HCO3=36, BUN=18, Creat=0.9, BNP=484 ~  labs 1/11 showed Na=144, K=3.9, HCO3=35, BUN=17, Creat=0.9, BNP=488 ~  [facility] 4/11 & meds changed by TH> off diuretics & KCl. ~  labs 10/15/09 showed Na=143, K=4.0, HCO3=37, BUN=12, Creat=0.8, BNP=761... restart Lasix, Diamox, KCl (one each). ~  labs 10/25/09 showed HCO3=44, BNP=588... rec> incr Diamox to 2Qpm... ~  labs 8/11 showed HCO3=39, BNP=481... continue same. ~  Labs 5/12 showed HCO3=35, BNP=373... Improved, continue same Rx.  ATRIAL FIBRILLATION (ICD-427.31) & SICK SINUS/ TACHY-BRADY SYNDROME (ICD-427.81) - new prob 1/10 w/ eval by DrKlein- rate control strategy on  Propranolol w/ Coumadin via [facility]. ~  EKG 1/10 showed AFib, rate~ 60, NSSTTWA... ~  2DEcho 3/10 showed norm LVF w/ EF= 60% & no regional wall motion abn, mild calcif AoV w/ mild AI, mild LA & RA dil w/ incr thickness of interatrial septum c/w lipomatous hypertrophy, PA sys est at 50... ~  Holter monitor 3/10 = AFib w/ rate 45-97...  VENOUS INSUFFICIENCY (ICD-459.81) - mild persist edema despite the sodium restriction, Lasix and Diamox... reviewed recommendation for No Salt, Elevation, TED's, etc...  DIABETES MELLITUS, BORDERLINE (ICD-790.29) - prev on Metformin 500mg /d (held after 4/11 hosp)... ~  last BS=181, HgA1c=6.4 in 11/08... ~  labs 6/09 showed BS= 101, HgA1c= 6.4 ~  labs 12/09 showed BS= 139,  HgA1c= 6.1 ~  labs 6/10 showed BS= 123, A1c= 6.1 ~  7/10: neg ophthal f/u DrHecker- no retinopathy, no macular edema. ~  labs 9/10 showed BS= 93 ~  labs 1/11 showed BS=146, A1c=5.7 ~  labs 5/11 showed BS= 100 on diet alone... ~  labs 8/11 showed BS= 96 ~  Labs 5/12 showed BS= 113, A1c= 5.8  DIVERTICULOSIS OF COLON, IRRITABLE BOWEL SYNDROME & COLONIC POLYPS (ICD-211.3) ~  colonoscopy was 11/06 by DrStark showing divertics and several polyps (one 20mm adenoma)... f/u 46yrs. ~  colonoscopy 1/10 by DrStark showed divertics (severe in sigmoid), 4mm polyp= tub adenoma... f/u 3 yrs.  BILAT BREAST CANCERS - resected via mastectomies in 1996 and 2002... followed by [facility] annually & seen 12/10.  DEGENERATIVE JOINT DISEASE (ICD-715.90) - w/ hx hyperuricemia on ALLOPURINOL 300mg /d...  Hx of HEADACHE (ICD-784.0)  ANXIETY (ICD-300.00) - prev rec to take Alpraz 0,5mg  Prn but she never filled the Rx. ~  Remeron 7.5mg /d started 4/11 hosp & stopped 5/11 due to lethargy, sleepiness...   Past Surgical History  Procedure Date  . Double mastectomy   . Knee surgery 2004    RIGHT  . Cataract extraction   . Cholecystectomy   . Mastectomy 1996    right breast for breast cancer  .  Mastectomy 1/02    left - Dr Ezzard Standing  . Total knee arthroplasty 6/04    right - Dr Priscille Kluver    Outpatient Encounter Prescriptions as of 10/24/2010  Medication Sig Dispense Refill  . acetaZOLAMIDE (DIAMOX) 250 MG tablet TAKE 2 TABLETS DAILY AT 4PM  60 tablet  4  . albuterol (PROAIR HFA) 108 (90 BASE) MCG/ACT inhaler Inhale 1-2 puffs into the lungs every 6 (six) hours as needed.        Marland Kitchen allopurinol (ZYLOPRIM) 300 MG tablet Take 1 tablet (300 mg total) by mouth daily.  30 tablet  6  . aspirin 81 MG tablet Take 81 mg by mouth daily.        . furosemide (LASIX) 40 MG tablet Take 1 tablet (40  mg total) by mouth daily.  30 tablet  6  . loperamide (IMODIUM) 2 MG capsule Take 2 mg by mouth as needed.        . meclizine (ANTIVERT) 25 MG tablet Take 25 mg by mouth every 4 (four) hours as needed.        . Multiple Vitamin (MULTIVITAMIN) capsule Take 1 capsule by mouth daily.        . potassium chloride SA (K-DUR,KLOR-CON) 20 MEQ tablet Take 20 mEq by mouth daily.        . propranolol (INDERAL) 80 MG tablet Take 80 mg by mouth 2 (two) times daily.        . quinapril (ACCUPRIL) 40 MG tablet Take 40 mg by mouth at bedtime.        Marland Kitchen warfarin (COUMADIN) 5 MG tablet Take by mouth as directed.        . clotrimazole (LOTRIMIN) 1 % cream Apply topically 2 (two) times daily.          Allergies  Allergen Reactions  . Iohexol      Desc: IV History sheet from prior CT states IV Contrast allergy- 13 hour prep given.     Review of Systems         See HPI - all other systems neg except as noted...  The patient complains of dyspnea on exertion and peripheral edema.  The patient denies anorexia, fever, weight loss, weight gain, vision loss, decreased hearing, hoarseness, chest pain, syncope, prolonged cough, headaches, hemoptysis, abdominal pain, melena, hematochezia, severe indigestion/heartburn, hematuria, incontinence, muscle weakness, suspicious skin lesions, transient blindness, difficulty walking, depression,  unusual weight change, abnormal bleeding, enlarged lymph nodes, and angioedema.     Objective:   Physical Exam      WD, WN, 77 y/o BF in NAD... GENERAL:  Alert & oriented; pleasant & cooperative... HEENT:  [place]/AT, EOM-wnl, PERRLA, EACs-clear, TMs-wnl, NOSE-clear, THROAT-clear & wnl. NECK:  Supple w/ fairROM; no JVD; normal carotid impulses w/o bruits; no thyromegaly or nodules palpated; no lymphadenopathy. CHEST:  Clear to P & A; without wheezes/ rales/ or rhonchi heard... HEART:  irregular rhythm; gr 1/6 SEM without rubs or gallops appreciated... ABDOMEN:  Soft & nontender; normal bowel sounds; no organomegaly or masses detected. EXT: without deformities, mild arthritic changes; no varicose veins/ +venous insuffic/ 1-2+edema. NEURO:  CN's intact; no focal neuro deficits... DERM:  There is a rounded lesion left calf c/w ringworm...   Assessment & Plan:   HYPOXEMIA & CO2 retention>  Much improved on her O2 regularly & w/ diuretic regimen including Acetazolamide, continue same...  HBP>  Controlled on Propranolol, Quinapril, Lasix, & Diamox; continue same...  AFib>  Stable on rate control strategy, no apparent prob w/ tachy or brady; continue same meds...  DM>  Well maintained on diet Rx...  GI>  Divertics, IBS, Polyps>  Stable, we discussed Simethacone Rx for gas...  Hx bilat breast cancers>  Stable, no known recurrence...  DJD>  Hx hyperuricemia on Allopurinol Rx & no clinical attacks...  Anxiety>  Stable on Prn Alprazolam Rx.Marland KitchenMarland Kitchen

## 2010-10-24 NOTE — Telephone Encounter (Signed)
Pt calling to say she requested refill Saturday and [facility] just called this pm, pt out needs refill asap   [address]

## 2010-10-24 NOTE — Patient Instructions (Signed)
Today we updated your med list in EPIC...    Continue your current meds the same for now...  For your GAS:    You may use OTC SIMETHACONE- as in Mylanta Gas, Maalox Plus, Mylicon, Gas-X, etc (take up to 4 times daily)    And you may use PHAZYME- take 1 tab 4 times daily for lower gas...  Today we did your follow up fasting blood work...    Please call the PHONE TREE in a few days for your results...    Dial [id number] & when prompted enter your patient number followed by the # symbol...    Your patient number is:  [id number]  Keep up the good effort w/ diet & exercise...  Call for any questions...  Let's plan a follow up visit in 4-6 months, sooner if needed for problems.Marland KitchenMarland Kitchen

## 2010-10-27 ENCOUNTER — Encounter: Payer: Self-pay | Admitting: Pulmonary Disease

## 2010-10-27 NOTE — Op Note (Signed)
NAME:  Cheyenne [name], Cheyenne [name]                       ACCOUNT NO.:  [id number]   MEDICAL RECORD NO.:  [id number]                   PATIENT TYPE:  INP   LOCATION:  NA                                   FACILITY:  [facility]   PHYSICIAN:  John L. Rendall III, M.D.           DATE OF BIRTH:  [birth year]   DATE OF PROCEDURE:  10/26/2002  DATE OF DISCHARGE:                                 OPERATIVE REPORT   PREOPERATIVE DIAGNOSIS:  End-stage osteoarthritis, right knee.   POSTOPERATIVE DIAGNOSIS:  End-stage osteoarthritis, right knee.   PROCEDURE:  Right LCS total knee replacement.   SURGEON:  John L. Priscille Kluver, M.D.   ASSISTANT:  Jamelle Rushing, P.[name].   ANESTHESIA:  General.   PATHOLOGY:  Bone-against-bone medial compartment, right knee, and  patellofemoral articulation with chronic pain.   INDICATION AND JUSTIFICATION FOR PROCEDURE:  Chronic medial right knee pain  resistant to conservative measures, with bone-against-bone medial  compartment.   DESCRIPTION OF PROCEDURE:  Under general anesthesia the right leg is  prepared with Duraprep and draped as [name] sterile field.  [name] sterile tourniquet  is used, the leg is wrapped out with the Esmarch, and the tourniquet is used  at 350 mm.  [name] midline incision is made, the patella is everted laterally.  Multiple small vessels are cauterized.  The femur is sized as [name] standard.  Using the first tibial guide, the proximal tibial resection is carried out.  Using the first femoral guide, an intercondylar drill hole is placed.  Using  the second guide, anterior and posterior flare of the distal femur are  resected with [name] 15 mm flexion gap.  It was necessary to release the MCL from  the tibia to balance flexion gap medially and laterally.  Once this was  done, the intramedullary guide was used, the distal femoral cut was made and  balanced for [name] 15 mm extension gap.  Recessing guide was then used.  Lamina  spreader was then inserted, remnants of the menisci and  cruciates were  resected.  The tibia was then exposed with McCale and Hohmann and sized to [name]  2-1/2.  [name] center peg hole was placed with fins.  Trial reduction of the 2-  1/2 tibia, 15 bearing, and standard femur revealed excellent fit and  alignment.  The patella was osteotomized, three peg holes placed.  The  patellar component was then inserted and with all trial components in place  and towel clips on the capsule, the knee had excellent range of motion with  perfect stability.  Permanent components were then obtained.  Bony surfaces  were prepared by pressure irrigation, and all components were cemented in  place.  Once the cement hardened, the excess was removed with osteotome and  mallet.  Minor debridement of the knee was completed.  [name] Hemovac drain was  inserted, the tourniquet was let down at 52 minutes.  The knee was then  closed in layers with #1 Tycron, 0 and 2-0 Vicryl, and skin clips.  Total  operative time approximately 1 hour 15 minutes.  The patient tolerated the  procedure well and returned to recovery in good condition.                                              John L. Dorothyann Gibbs, M.D.   Renato Gails  D:  10/26/2002  T:  10/26/2002  Job:  [id number]

## 2010-10-27 NOTE — Discharge Summary (Signed)
NAME:  Cheyenne Cheyenne, Cheyenne Cheyenne                       ACCOUNT NO.:  [id number]   MEDICAL RECORD NO.:  [id number]                   PATIENT TYPE:  INP   LOCATION:  5016                                 FACILITY:  [facility]   PHYSICIAN:  John L. Rendall, M.D.               DATE OF BIRTH:  Nov 09, 1925   DATE OF ADMISSION:  10/26/2002  DATE OF DISCHARGE:  10/29/2002                                 DISCHARGE SUMMARY   ADMISSION DIAGNOSES:  1. End-stage osteoarthritis, right knee, with bone-on-bone medial     compartment.  2. Hypertension.  3. Occasional vertigo.   DISCHARGE DIAGNOSES:  1. Status post right total knee arthroplasty.  2. Postoperative anemia secondary to surgery.  3. Postoperative hypokalemia.  4. Hypertension.  5. Occasional vertigo.   HISTORY OF PRESENT ILLNESS:  Cheyenne Cheyenne is Cheyenne Cheyenne with  progressively worsening right knee pain for the last 1-1/2 years.  The pain  is worse first thing in the morning.  Heat makes her discomfort better.  Ambulation causes her Cheyenne great deal of pain.  It radiates down to the  proximal tibia.  She does have mechanical symptoms such as grinding,  popping, and catching.  The patient denies any night pain.  She does have  swelling in the knee.  The patient denies any previous surgical procedure  performed on her right knee.   X-rays show complete collapse of the medial joint line with increased  sclerotic areas of the femoral and tibial regions.  There is no significant  spurring noted.   ALLERGIES:  INTRAVENOUS CONTRAST.   MEDICATIONS:  1. Accupril 20 mg p.o. daily.  2. Propranolol 80 mg p.o. b.i.d.  3. Potassium chloride 8 mEq p.o. b.i.d.  4. Meclizine 25 mg p.o. p.r.n.  5. Centrum Cheyenne-Z, one tablet p.o. daily.  6. Bayer aspirin 325 mg p.o. daily.  7. Diltiazem 180 mg p.o. daily.  8. Triamterene/HCTZ 75/50 mg p.o. daily.   PROCEDURE:  The patient was taken to the operating room on Oct 26, 2002, by  Dr. Erasmo Leventhal  assisted by Arlyn Leak, P.Cheyenne.-C.  The patient was placed  under general anesthesia and Cheyenne right LCS total knee replacement was  performed.  Operative time was 1 hour 15 minutes.  The patient tolerated the  procedure well and returned to recovery in good stable condition.   CONSULTATIONS:  1. Physical and occupational therapy.  2. Rehabilitation.   HOSPITAL COURSE:  The patient developed postoperative anemia secondary to  surgery and required two units of packed red blood cells.  Also, he  developed hypokalemia postoperatively and was treated.  The patient  progressed slowly with physical therapy and, therefore, was discharged to  rehabilitation.   LABORATORY DATA:  CBC on admission: White blood count 9.2, hemoglobin 12.9,  hematocrit 32, platelets 246.  Coags on admission:  PT 13.0, INR 0.9, PTT  29.  Routine chemistries:  Sodium 141,  potassium 3.8, chloride 101, bicarb  32, glucose 176, BUN 20, creatinine 1.2, calcium 9.9.  Hepatic enzymes on admission:  AST 23, ALT 14, ALP 61, total bilirubin 0.8.  Urinalysis on admission was negative.   Chest x-ray dated June 18, 2000, showed mild cardiomegaly.   DISCHARGE MEDICATIONS:  The patient was discharged to rehabilitation on her  home medications plus the following:  1. Percocet 5 mg 1-2 tablets p.o. q.4-6 h. p.r.n. pain.  2. Robaxin 500 mg p.o. q.6-8 h. p.r.n. muscle spasm.  3. Restoril 15 mg p.o. at bedtime p.r.n. sleep.  4. Arixtra 2.5 mg subcu daily for Cheyenne total of 7-10 days.  5. K-Dur 20 mg p.o. b.i.d.   ACTIVITY:  The patient is weightbearing as tolerated with Cheyenne walker.   DIET:  No restrictions.   CONDITION ON DISCHARGE:  The patient was discharged to rehabilitation in  good stable condition.   FOLLOWUP:  The patient needs to follow up with Dr. Priscille Kluver in approximately  ten days postoperatively for staple removal and reevaluation.  The patient  is to call [phone] for an appointment.   SPECIAL INSTRUCTIONS:  CPM 0-90  degrees 6-8 hours Cheyenne day, increase by 10  degrees daily.  The patient is to call Dr. Sable Feil office if she develops  Cheyenne temperature greater than 101.5, chills, foul-smelling drainage from the  wound, swelling, more pain that is not controlled with pain medications.   WOUND CARE:  The wound is to be kept clean and dry.  The patient may shower  after two days if no drainage from the wound.     Kirtland Bouchard, P.Cheyenne.-C                 Carlisle Beers. Priscille Kluver, M.D.    Arvella Merles  D:  12/09/2002  T:  12/09/2002  Job:  [id number]

## 2010-10-27 NOTE — Discharge Summary (Signed)
NAME:  Cheyenne [name], Cheyenne [name]                       ACCOUNT NO.:  [id number]   MEDICAL RECORD NO.:  [id number]                   PATIENT TYPE:  IPS   LOCATION:  4151                                 FACILITY:  [facility]   PHYSICIAN:  Ranelle Oyster, M.D.             DATE OF BIRTH:  29-Feb-1926   DATE OF ADMISSION:  10/29/2002  DATE OF DISCHARGE:  11/06/2002                                 DISCHARGE SUMMARY   DISCHARGE DIAGNOSES:  1. Right total knee arthroplasty secondary to degenerative disk disease on     Oct 26, 2002.  2. Postoperative anemia.  3. Pain management.  4. Right lower lobe pneumonia, resolving.  5. Hypertension.  6. Vertigo.  7. Hypokalemia, resolved.   HISTORY OF PRESENT ILLNESS:  This 77 year old black female admitted on Oct 26, 2002, with end-stage degenerative joint disease of the right knee and no  relief with conservative care.  Underwent a right total knee arthroplasty on  Oct 26, 2002, per Dr. Priscille Kluver, placed on Arixtra for deep venous thrombosis  prophylaxis and weightbearing as tolerated.  Postoperative anemia at 8, and  transfused 2 units of packed red blood cells on Oct 28, 2002.  Mild  hypokalemia at 3.2, and supplemented.  Pain management ongoing.  Moderate  assistance for bed mobility and total assist transfers.  Latest hemoglobin  9.1.  Chemistries unremarkable.  Chest x-ray preoperatively was negative.  Admitted for a comprehensive rehabilitation program.   PAST MEDICAL HISTORY:  See discharge diagnoses.   PAST SURGICAL HISTORY:  1. Bilateral breast mastectomy in 1996 and 2001.  2. Cholecystectomy.   ALLERGIES:  IV DYE.   PRIMARY CARE PHYSICIAN:  Lonzo Cloud. Kriste Basque, M.D.   HABITS:  No alcohol or tobacco.   MEDICATIONS PRIOR TO ADMISSION:  1. Cardizem.  2. Accupril.  3. Maxzide.  4. Inderal.  5. Potassium chloride.  6. Meclizine.  7. Multivitamin.  8. Ecotrin.   SOCIAL HISTORY:  He lives alone in [place], independent prior to  admission.   One level home, one step to entry, local family works.   HOSPITAL COURSE:  The patient did well while in rehabilitation services with  therapies initially on a b.i.d. basis.  The following issues were followed  during the patient's rehabilitation course.  Pertaining to Ms. Bredeson's  right total knee arthroplasty, surgical site healing nicely, no signs of  infection.  Neurovascular sensation remained intact.  She was weightbearing  as tolerated.  She continued on Arixtra for deep venous thrombosis  prophylaxis.  Venous Doppler studies prior to her discharge were negative.  Pain management with the use of Tylox.  Blood pressures remained controlled  on a home regimen of Cardizem, Lisinopril, and Maxzide.  Postoperative  anemia with the latest hemoglobin of 10.6, hematocrit 32.  During her  rehabilitation course noted some wheezing and productive cough.  A chest x-  ray on Oct 29, 2002, showed suspect right lower lobe  pneumonia.  She was  placed on Cipro with followup chest x-ray on Nov 02, 2002, showing  significant improvement.  She would continue her Cipro through Nov 08, 2002,  and discontinue.  She remained afebrile throughout this course.  She had no  bowel or bladder disturbances.  Overall for her functional mobility, she was  supervision with her mobility as well as activities of daily living,  endurance had greatly improved.  Home health therapies would be ongoing.   The latest labs showed a hemoglobin of 10.6, hematocrit 32, platelets  325,000.  Sodium 141, potassium 4.6, BUN 15, creatinine 1.3.   DISCHARGE MEDICATIONS:  1. Cardizem 180 mg daily.  2. Zestril 20 mg daily.  3. Maxzide one tablet daily.  4. She would resume her potassium chloride as prior to hospital admission at     8 mEq b.i.d.  5. Inderal 80 mg daily.  6. Trinsicon b.i.d.  7. Multivitamin daily.  8. Cipro 500 mg b.i.d. until Nov 08, 2002, and then stop.  9. Tylox p.r.n. pain.   ACTIVITY:  Weightbearing as  tolerated.   DIET:  Regular.   WOUND CARE:  Cleanse incision daily with soap and water.   SPECIAL INSTRUCTIONS:  Home health physical therapy and occupational  therapy.    FOLLOWUP:  1. The patient should follow up with Dr. Priscille Kluver, orthopaedic service, call     for an appointment.  2. Dr. Kriste Basque, medical management.     Mariam Dollar, P.A.                     Ranelle Oyster, M.D.    DA/MEDQ  D:  11/05/2002  T:  11/05/2002  Job:  [id number]   cc:   Jonny Ruiz L. Rendall III, M.D.  [address] [place]  [place] [address]  Fax: [phone]   Lonzo Cloud. Kriste Basque, M.D. [facility]

## 2010-10-27 NOTE — H&P (Signed)
NAME:  Cheyenne [name], Cheyenne [name]                       ACCOUNT NO.:  [id number]   MEDICAL RECORD NO.:  [id number]                   PATIENT TYPE:  INP   LOCATION:                                       FACILITY:  [facility]   PHYSICIAN:  Jamelle Rushing, P.[name].                DATE OF BIRTH:  [birth year]   DATE OF ADMISSION:  10/26/2002  DATE OF DISCHARGE:                                HISTORY & PHYSICAL   CHIEF COMPLAINT:  Right knee pain for the last 1 1/2 years.   HISTORY OF PRESENT ILLNESS:  The patient is [name] year old black female with  [name] progressively worsening right knee pain for the last 1 1/2 years.  The  patient states the pain is worse first thing in the morning.  If she sits in  [name] tub of hot water it makes some of the discomfort better, but she still has  sharp pain in the knee with any type of weightbearing activity.  The pain  does occasionally radiate down into the proximal tibia.  She does have  mechanical symptoms such as grinding, popping and catching.  She denies any  night pain.  She does have swelling in the knee.  She has no previous  surgical procedure performed on the knee.   X-rays show complete collapse of the medial joint line with increased  sclerotic borders of the femoral and tibial region.  No significant  spurring.   ALLERGIES:  IV DYE.   CURRENT MEDICATIONS:  1. Diltiazem 180 mg p.o. daily.  2. Accupril 20 mg p.o. daily.  3. Triamterene/HCTZ 75/50 mg p.o. daily.  4. Propranolol 80 mg p.o. b.i.d.  5. Potassium chloride 8 mEq p.o. b.i.d.  6. Meclizine 25 mg p.o. p.r.n.  7. Centrum [name] to Z one tablet p.o. daily.  8. Bayer aspirin 325 mg p.o. daily.   CURRENT MEDICAL HISTORY:  1. Hypertension.  2. Occasional vertigo.  3. The patient denies any diabetes, thyroid disease, heart disease, asthma,     or any other respiratory issues.   PAST SURGICAL HISTORY:  1. Cholecystectomy.  2. Right and left breast surgery for cancer.  3. The patient denies any  complications with the above mentioned surgical     procedures.   SOCIAL HISTORY:  The patient is [name] year old short in stature heavy set  black female.  Denies any history of smoking or alcohol use.  She is  currently widowed.  She does have several grown children.  She lives in [name]  one-story house and she is [name] retired Insurance account manager.   FAMILY PHYSICIAN:  Lonzo Cloud. Kriste Basque, M.D. [facility] at [phone].   FAMILY HISTORY:  Mother deceased from heart attack.  Father deceased from  bleeding ulcers.  The patient has two brothers alive with [name] history if  coronary artery bypass graft and diabetes.  One sister alive with [name] history  of breast cancer and  renal failure currently on dialysis.   REVIEW OF SYSTEMS:  Positive for contact lenses.  She does have occasional  problems with diarrhea for which she uses over-the-counter medications.  Otherwise, the review of systems categories are negative.   PHYSICAL EXAMINATION:  VITALS:  Height 5 feet 3 inches, weight 184 pounds,  pulse 60 and regular, respirations 12, blood pressure 146/78.  GENERAL:  This is [name] healthy-appearing short stature heavy set black female  who ambulates with [name] slight right-sided limp.  She is able to get herself on  and off the exam table without any difficulty or any signs of obvious  distress.  HEENT:  Head was normocephalic, atraumatic.  Nontender of her maxillary and  frontal sinuses.  Pupils are equal, round, reactive accommodating to light.  Extraocular movements intact.  Sclerae not icteric.  Conjunctivae pink and  moist.  External ears without deformities.  Canals cerumen impacted.  Gross  hearing is intact.  Nasal septum was midline.  Mucous membranes pink and  moist.  Oral buccal mucosa was pink and moist without lesions.  Dentition is  in good repair. The patient is able to swallow without any difficulty.  NECK:  Supple.  No palpable lymphadenopathy.  Thyroid region was nontender.  The patient had excellent range of  motion of her cervical spine without any  tenderness. She was nontender to percussion along the entire spinal column.  CHEST:  Lung sounds were clear and equal bilaterally.  No wheezes, rales,  rhonchi, rubs noted.  HEART:  Regular  rate and rhythm.  S1, S2 auscultated.  No murmurs, rubs or  gallops noted.  ABDOMEN:  Round, full, obese-like.  Nontender to percussion or deep  palpation.  No hepatosplenomegaly.  Bowel sounds normal active throughout.  Posterior CVA region was nontender to percussion.  EXTREMITIES:  Upper extremities were symmetrically size and shape.  The  patient had good range of motion of her shoulders, elbows, and wrists  without any difficulty. Motor strength was 5/5.  LOWER EXTREMITIES:  Grossly symmetrically size and shape.  She had excellent  5/5 motor strength in all muscle groups tested.  Hips had full extension,  flexion up to 120 degrees with 20 degrees internal, external rotation  without any mechanical symptoms or discomfort.  Right knee was without any  signs or erythema or ecchymosis.  She had no palpable effusion.  She was  slightly tender along the medial joint line.  She had significant coarse  crepitus with range of motion which was about five degrees full extension to  115 degrees.  She had slight valgus varus laxity but no anterior or  posterior draw.  The calf was nontender.  Left knee was without any signs of  erythema or ecchymosis.  No palpable effusion.  She was generally sore  throughout.  She had [name] small subcutaneous hard mass along the lateral  patella which was mobile.  There was no sign of erythema, slightly tender at  this time.  She had full extension flexion back to 120 degrees.  No  significant medial lateral laxity.  Anterior posterior draw was nontender.  She had slight crepitus under the patella with range of motion.  The calf  was nontender.  Bilateral  ankles were symmetrical with good dorsi and  plantar flexion. PERIPHERAL  VASCULATURE:  Carotid pulses were 2+.  No bruits.  Radial pulses  were 2+.  Dorsalis pedis pulses were 2+.  Unable to palpate posterior  tibial.  She had trace pitting  edema in the lower extremities with no  significant varicosities.  NEURO:  The patient was conscious, alert and appropriate.  Held conversation  with examiner.  Cranial nerves II-XII were grossly intact.  She is grossly  intact to light  touch sensation from head to toe.  There is no gross  neurologic defects.  BREAST/RECTAL/GU:  Deferred at this time.   IMPRESSION:  1. End-state osteoarthritis right knee with bone-on-bone medial compartment.  2. Hypertension.  3. Occasional vertigo.   PLAN:  The patient will be admitted to [facility] on 10/26/02 under  the care of John L. Rendall, M.D.  The patient will undergo all routine labs  and tests prior to having [name] right total knee arthroplasty performed.                                                  Jamelle Rushing, P.[name].    RWK/MEDQ  D:  10/19/2002  T:  10/20/2002  Job:  [id number]

## 2010-10-27 NOTE — Assessment & Plan Note (Signed)
[facility]                               PULMONARY OFFICE NOTE   Cheyenne [name], Cheyenne [name]                      MRN:          [id number]  DATE:04/16/2006                            DOB:          September 15, 1925    HISTORY OF PRESENT ILLNESS:  The patient is a very pleasant 77 year old  [place] female, a patient of Dr. Kriste Basque, who has a history of  hypertension, diabetes mellitus, and osteoarthritis, presents for an acute  office visit.  The patient complains over the last 3 days she has had  several episodes in which she has become significantly short of breath with  minimal activity.  The patient reports that this morning in particular that  she was working at [employer] and she was getting ready and trying to  get out to the car, she was quite short of breath.  She has also had  significant increase in lower extremity swelling bilaterally.  The patient  does report she has been out of her fluid pill, Maxzide, over the last 3  days.   The patient denies any associated chest pain, radiating pain, jaw or neck  pain, nausea, vomiting, diaphoresis, palpitations, or neurological changes.   PAST MEDICAL HISTORY:  Reviewed.   CURRENT MEDICATIONS:  Reviewed.   PHYSICAL EXAMINATION:  GENERAL:  The patient is a pleasant obese female in  no acute distress.  VITAL SIGNS:  Temperature is 99.6, blood pressure 142/90, O2 saturation is  93% on room air, weight is up 3 pounds at 192.  HEENT:  Unremarkable.  NECK:  Supple without adenopathy.  No JVD.  RESPIRATORY:  Lung sounds are clear without any wheezing or crackles.  CARDIAC:  Regular rate and rhythm without murmur, rub, or gallop.  ABDOMEN:  Obese and soft without any hepatosplenomegaly.  The patient does  have noted bilateral previous mastectomies.  EXTREMITIES:  Warm without any calf tenderness, cyanosis, clubbing.  The  patient has 2+ edema bilaterally.   DATA:  EKG reveals a sinus bradycardia  with a rate of 54.  The patient has  some ST changes but they are nonspecific and have been on her previous EKGs,  also to note the patient does have a first degree AV block.   IMPRESSION/PLAN:  Dyspnea with activity.  Suspect the patient has a  component of volume overload secondary to recent discontinuation of her  fluid pill, Maxzide.  The patient will begin Lasix 40 mg daily.  At present  will discontinue Maxzide.  She is to keep a low sodium diet.  Keep lower  extremities elevated.  She will recheck here in one week or sooner if  needed.  The patient is advised if she develops any worsening symptoms,  symptoms do not improve, or she  develops any chest pain she is to contact us immediately or go to the  closest emergency department.      Rubye Oaks, NP  Electronically Signed      Lonzo Cloud. Kriste Basque, MD  Electronically Signed   TP/MedQ  DD: 04/17/2006  DT: 04/17/2006  Job #: [id number]

## 2010-10-27 NOTE — Op Note (Signed)
[place]. [facility]  Patient:    Cheyenne [name], Cheyenne [name]                   MRN: [id number] Proc. Date: 06/20/00 Attending:  Sandria Bales. Ezzard [name], M.D. CC:         Cheyenne [name], M.D. [facility]  Cheyenne [name], M.D.   Operative Report  DATE OF BIRTH:  24-Nov-1925  PREOPERATIVE DIAGNOSIS:  Left breast cancer 1 oclock position.  POSTOPERATIVE DIAGNOSIS:  Left breast cancer 1 oclock position.  OPERATION PERFORMED:  Left simple mastectomy with left sentinel lymph node biopsy.  Counts of 2500 with background counts of 20.  SURGEON:  Cheyenne [name], M.D.  ASSISTANT:  Cheyenne [name], M.D.  ANESTHESIA:  General endotracheal.  ESTIMATED BLOOD LOSS:  150 cc.  DRAINS:  One 10 mm Jackson-Pratt drain.  COMPLICATIONS:  None.  INDICATIONS FOR PROCEDURE:  Cheyenne [name] is a 77 year old black female who had a right mastectomy for breast cancer in June of 1996.  She completed five years of tamoxifen but now she has a mass in her left breast which on needle aspiration biopsy is consistent with a left breast cancer.  She now comes for left mastectomy with sentinel lymph node biopsy and possible axillary dissection.  Before coming to the operating room the patient had injection of technetium sulfur colloid around the nipple areolar complex.  I then injected the area around the breast tumor with isosulfan blue.  The left breast was prepped with betadine solution and sterilely draped.  I marked out where I was going to do the mastectomy flaps and started  with an axillary dissection identifying a hot blue lymph node at the lateral edge of the pectoralis major muscle which had counts of some 2500 with a back ground of only 20.  There were other warmer spots in the axilla ranging from counts of 150 to 300.  I really felt this was a single clear abnormal lymph node both on isosulfan blue and the radioactive isotope.  I then turned my attention to doing the mastectomy  and went to the lateral edge of the sternum.  I went superiorly to about a fingerbreadth or two below the clavicle.  I went laterally to the edge of the latissimus dorsi muscle and inferiorly to the rectus abdominis muscle.  I then reflected the breast off the chest wall identifying the pectoralis major muscle.  The entire breast was then reflected off.  I marked the upper outer skin with a long suture.  Cheyenne [name] called back and said the sentinel lymph node biopsy was negative.  Therefore, we terminated the surgery after taking the mastectomy ____________ lymph nodes out of the axilla in addition to the specimen.  I then irrigated the wound, brought out a 10 mm Jackson-Pratt drain through the lateral inferior flap and sewed this in place with a 3-0 nylon suture.  I then closed the skin with 3-0 Vicryl sutures.  The skin closed with a skin gun, sterilely dressed with 4 x 4s and Hypafix.  The patient tolerated the procedure well and was transported to the recovery room in good condition. The sponge and needle counts were correct at the end of the case.  DESCRIPTION OF PROCEDURE: DD:  06/20/00 TD:  06/20/00 Job: [id number]

## 2010-11-02 ENCOUNTER — Ambulatory Visit (INDEPENDENT_AMBULATORY_CARE_PROVIDER_SITE_OTHER): Payer: Medicare Other | Admitting: *Deleted

## 2010-11-02 DIAGNOSIS — I4891 Unspecified atrial fibrillation: Secondary | ICD-10-CM

## 2010-11-02 LAB — POCT INR: INR: 2

## 2010-11-30 ENCOUNTER — Ambulatory Visit (INDEPENDENT_AMBULATORY_CARE_PROVIDER_SITE_OTHER): Payer: Medicare Other | Admitting: *Deleted

## 2010-11-30 DIAGNOSIS — I4891 Unspecified atrial fibrillation: Secondary | ICD-10-CM

## 2010-11-30 LAB — POCT INR: INR: 2

## 2010-12-28 ENCOUNTER — Ambulatory Visit (INDEPENDENT_AMBULATORY_CARE_PROVIDER_SITE_OTHER): Payer: Medicare Other | Admitting: *Deleted

## 2010-12-28 DIAGNOSIS — I4891 Unspecified atrial fibrillation: Secondary | ICD-10-CM

## 2011-01-16 ENCOUNTER — Telehealth: Payer: Self-pay | Admitting: Pulmonary Disease

## 2011-01-16 MED ORDER — HYDROCODONE-HOMATROPINE 5-1.5 MG/5ML PO SYRP: 5.0000 mL | ORAL_SOLUTION | ORAL | Status: AC | PRN

## 2011-01-16 NOTE — Telephone Encounter (Signed)
Pt ws last seen 10/14/10 and requesting rx for hydrocodone cough syurp to be called in to have on hand. Please advise Dr. Kriste Basque if okay to send rx. Thanks  Carver Fila, CMA

## 2011-01-16 NOTE — Telephone Encounter (Signed)
Ok to send in cough meds.  Called and spoke with pt and she is aware of meds called to the pharmacy.

## 2011-01-19 ENCOUNTER — Encounter (INDEPENDENT_AMBULATORY_CARE_PROVIDER_SITE_OTHER): Payer: Self-pay | Admitting: Surgery

## 2011-01-19 ENCOUNTER — Ambulatory Visit (INDEPENDENT_AMBULATORY_CARE_PROVIDER_SITE_OTHER): Payer: Medicare Other | Admitting: Surgery

## 2011-01-19 VITALS — Wt 157.0 lb

## 2011-01-19 DIAGNOSIS — Z853 Personal history of malignant neoplasm of breast: Secondary | ICD-10-CM

## 2011-01-19 NOTE — Progress Notes (Signed)
Chief Complaint  Patient presents with  . Other    LTF bilateral masty    Cheyenne [name] is a 77 y.o. AA female who is a patient of NADEL,SCOTT M, MD, MD and comes to me today for follow up of bilateral breast cancer.  The patient had a right breast cancer treated with a right mastectomy by Dr. Baldo Ash in 1996. She had a 1 cm invasive ductal carcinoma with zero of 19 nodes involved. She took tamoxifen for 5 years.  She had a second breast cancer of her left breast which she had a left mastectomy by Dr. Ovidio Kin in January of 2002. She had a 1.1 cm invasive ductal carcinoma with a negative sentinel lymph node biopsy.  She has done well for her breast cancers with no evidence of recurrence. Her primary problem is worsening lung disease requiring chronic oxygen supplement.  Past Medical History  Diagnosis Date  . Allergic rhinitis   . Hypoxemia   . HTN (hypertension)   . Atrial fibrillation   . Sick sinus syndrome with tachycardia   . Venous insufficiency   . Borderline diabetes mellitus   . IBS (irritable bowel syndrome)   . Colon polyp   . Adenocarcinoma, breast     bilateral  . DJD (degenerative joint disease)   . Headache   . Anxiety     Allergies  Allergen Reactions  . Iohexol      Desc: IV History sheet from prior CT states IV Contrast allergy- 13 hour prep given.    SOCIAL and FAMILY HISTORY: She just got back from a family reunion in [place].  PHYSICAL EXAM: Wt 157 lb (71.215 kg)  Neck: No thyroid mass or nodule. Lymph nodes: No cervical, supraclavicular, or axillary adenopathy. Chest: Both mastectomy incision is well-healed without mass or nodule. Lungs: Clear to auscultation. Heart: Regular and rhythm. I hear no murmur   ASSESSMENT and PLAN: #1. Right breast cancer treated with right mastectomy by Dr. Baldo Ash in 1996. Disease-free.  #2. Left breast cancer treated with left mastectomy by Dr. Ovidio Kin in January 2002.  Disease-free The patient wants to be seen on an annual basis. Since she is more than 10 years after her last breast cancer, certainly she could make this her last appointment.  At this time, she still is be followed.  #3. Chronic O2 for lung disease.

## 2011-01-25 ENCOUNTER — Ambulatory Visit (INDEPENDENT_AMBULATORY_CARE_PROVIDER_SITE_OTHER): Payer: Medicare Other | Admitting: *Deleted

## 2011-01-25 DIAGNOSIS — I4891 Unspecified atrial fibrillation: Secondary | ICD-10-CM

## 2011-02-22 ENCOUNTER — Ambulatory Visit (INDEPENDENT_AMBULATORY_CARE_PROVIDER_SITE_OTHER): Payer: Medicare Other | Admitting: *Deleted

## 2011-02-22 DIAGNOSIS — I4891 Unspecified atrial fibrillation: Secondary | ICD-10-CM

## 2011-03-03 ENCOUNTER — Other Ambulatory Visit: Payer: Self-pay | Admitting: Pulmonary Disease

## 2011-03-06 ENCOUNTER — Other Ambulatory Visit: Payer: Self-pay | Admitting: *Deleted

## 2011-03-07 ENCOUNTER — Other Ambulatory Visit: Payer: Self-pay | Admitting: *Deleted

## 2011-03-07 MED ORDER — WARFARIN SODIUM 5 MG PO TABS: 5.0000 mg | ORAL_TABLET | Freq: Every day | ORAL | Status: DC

## 2011-03-19 ENCOUNTER — Encounter: Payer: Self-pay | Admitting: Pulmonary Disease

## 2011-03-22 ENCOUNTER — Ambulatory Visit (INDEPENDENT_AMBULATORY_CARE_PROVIDER_SITE_OTHER): Payer: Medicare Other | Admitting: *Deleted

## 2011-03-22 DIAGNOSIS — I4891 Unspecified atrial fibrillation: Secondary | ICD-10-CM

## 2011-04-19 ENCOUNTER — Ambulatory Visit (INDEPENDENT_AMBULATORY_CARE_PROVIDER_SITE_OTHER): Payer: Medicare Other | Admitting: *Deleted

## 2011-04-19 DIAGNOSIS — I4891 Unspecified atrial fibrillation: Secondary | ICD-10-CM

## 2011-04-19 DIAGNOSIS — Z7901 Long term (current) use of anticoagulants: Secondary | ICD-10-CM

## 2011-04-19 LAB — POCT INR: INR: 3.8

## 2011-04-24 ENCOUNTER — Ambulatory Visit (INDEPENDENT_AMBULATORY_CARE_PROVIDER_SITE_OTHER): Payer: Medicare Other | Admitting: Pulmonary Disease

## 2011-04-24 ENCOUNTER — Other Ambulatory Visit (INDEPENDENT_AMBULATORY_CARE_PROVIDER_SITE_OTHER): Payer: Medicare Other

## 2011-04-24 ENCOUNTER — Encounter: Payer: Self-pay | Admitting: Pulmonary Disease

## 2011-04-24 DIAGNOSIS — R0609 Other forms of dyspnea: Secondary | ICD-10-CM

## 2011-04-24 DIAGNOSIS — R0600 Dyspnea, unspecified: Secondary | ICD-10-CM

## 2011-04-24 DIAGNOSIS — I872 Venous insufficiency (chronic) (peripheral): Secondary | ICD-10-CM

## 2011-04-24 DIAGNOSIS — I10 Essential (primary) hypertension: Secondary | ICD-10-CM

## 2011-04-24 DIAGNOSIS — M1990 Unspecified osteoarthritis, unspecified site: Secondary | ICD-10-CM

## 2011-04-24 DIAGNOSIS — Z853 Personal history of malignant neoplasm of breast: Secondary | ICD-10-CM

## 2011-04-24 DIAGNOSIS — F411 Generalized anxiety disorder: Secondary | ICD-10-CM

## 2011-04-24 DIAGNOSIS — R0902 Hypoxemia: Secondary | ICD-10-CM

## 2011-04-24 DIAGNOSIS — I4891 Unspecified atrial fibrillation: Secondary | ICD-10-CM

## 2011-04-24 DIAGNOSIS — R7309 Other abnormal glucose: Secondary | ICD-10-CM

## 2011-04-24 DIAGNOSIS — Z23 Encounter for immunization: Secondary | ICD-10-CM

## 2011-04-24 LAB — BASIC METABOLIC PANEL
Calcium: 9.1 mg/dL (ref 8.4–10.5)
GFR: 85.55 mL/min (ref >60.00)
Sodium: 146 mEq/L — ABNORMAL HIGH (ref 135–145)

## 2011-04-24 MED ORDER — TRAMADOL HCL 50 MG PO TABS: 50.0000 mg | ORAL_TABLET | Freq: Three times a day (TID) | ORAL | Status: DC | PRN

## 2011-04-24 NOTE — Patient Instructions (Signed)
Today we updated your med list in our EPIC system...    Continue your current medications the same...  Try TRAMADOL 50mg  up to three times daily as needed for your shoulder pain...    Apply heat etc 7 let me know if it is not improving so we can send you to an orthopedist to check...  We gave you the 2012 flu vaccine today...  Today we did your follow up blood work...    Please call the PHONE TREE in a few days for your results...    Dial [id number] & when prompted enter your patient number followed by the # symbol...    Your patient number is:  [id number]  Call for any questions...  Let's plan another visit in about 6 months.Marland KitchenMarland Kitchen

## 2011-04-28 ENCOUNTER — Other Ambulatory Visit: Payer: Self-pay | Admitting: Pulmonary Disease

## 2011-05-09 ENCOUNTER — Encounter: Payer: Self-pay | Admitting: Pulmonary Disease

## 2011-05-09 NOTE — Progress Notes (Signed)
Subjective:    Patient ID: Cheyenne [name], female    DOB: 1925-07-25, 77 y.o.   MRN: [id number]  HPI 77 y/o BF here for a follow up visit... she has multiple medical problems as noted below>>>  ~  May11:  she was adm 4/27-28/11 w/ confusion, weakness w/ fall (no signif trauma), ?dehydration, depresssed over the passing of her sister... she's had some intermittent SOB, not resting well & daughter does not want sleeping pills due to prev reaction... they will try Tylenol PM & consider 1/2 of the Alpraz0.5mg ... protimes monitored in the CC now... on one Lasix & K20 Qam, & one Diamox Qpm> f/u labs shows HCO3=44, BNP=588... rec> incr the Diamox to 2 Qpm.. ~  Jun11:  she is improved- feeling better overall... resting better, appetite has returned, etc... weight 157# is down 10# from 1/11 and edema improved down to trace at present... BP controlled on meds;  chr AFib w/ rate control strategy & Coumadin via CC;  BS OK on diet alone...  we reviewed diuretic regimen of Lasix 40mg AM, KCl , Acetazolamide 250mg - 2tabsPM... ~  Dec11:  she continues stable over the last few months-  BP controlled, AFib w/ rate control strategy & coumadin, otherw stable & she likes the Liq Oxygen w/ incr mobility... OK Flu shot today.  ~  July 25, 2010:   Pt noticed a rash on her left calf area & mentioned it to the staff at the coumadin clinic;  they told her to f/u w/ me & set up this appt for her;  she has a ringworm (tinea corporis) on her calf & we discussed this> rx w/ generic LOTRIMIN AF cream...  She reports otherw stable on her port O2, diuretic Rx & same meds (see below)>>>  ~  Oct 24, 2010:  60mo ROV & everything appears stable> he CC is "gas" & we discussed anti-gas Rx w/ Mylicon/ Simethacone/ Phazyme;  Breathing stable on rx w/ O2, Proair, Diamox diuretic;  BP controlled- denies CP, palpit, ch in SOb or edema;  Stable AFib on rate control strategy;  DM well controlled on diet alone;  Other problems as noted  below>  Labs today look good!  ~  April 24, 2011:  70mo ROV and her CC is some left shoulder pain, incr w/ certain movements, taking OTC Tylenol etc; she has known hx DJD & CSpine dis w/ spinal stenosis C1-2 on prev MRI- doesn't want neurosurg eval just incr pain Rx & we will try Tramadol prn...  Breathing stable & labs look ok on Lasix/ Diamox/ KCl;  94% sat on 2L/min by [place];  BP controlled on her med regimen (see below)- continue same;  Hx AFib followed by drKlein on Coumadin via CC;  BS controlled on diet alone, wt=158# which is down 5# in the last 70mo;  She has had f/u [facility] 8/12 for f/u breast cancers- no evid recurrence;  See prob list below>>          Problem List:  ALLERGIC RHINITIS (ICD-477.9) - on FLONASE Qhs & ZYRTEK QAM Prn.  Hx of HYPOXEMIA (ICD-799.02) - full eval for hypoxemia in [id number]- CXR= cardiomegaly, ectatic Ao, pulm ven HTN... CTAngio= neg, without PE, min scarring in lingula... 2DEcho= norm LVF w/ EF=55%, LVwall thickness at upper limits, mild ca++ AoV w/ mild AI,  PA sys pressure = ... O2 sats 90-94 at rest on RA... w/ ambulation in the office sats drop to 85%... she didn't yet want to go on oxygen at  home... just occas SOB w/ activity- she is too sedentary and needs to incr her exercise program. ~  6/10: c/o increased DOE & no energy- O2 sat= 91% rest & 86% w/activ... rec> home O2 1L/min rest, 2L/min exercise. ~  8/11:  pt requesting change to LiqO2 to incr mobility. ~  11/12:  O2 sat= 94% at rest on 2L/min oxygen via [place]...  HYPERTENSION (ICD-401.9) - controlled on PROPRANOLOL 80mg Bid, QUINAPRIL 40mg /d; and back on LASIX 40mg AM, KCL , & ACETAZOLAMIDE 250mg - 2tabsPM... she knows to avoid sodium etc... ~  labs 2/09 showed Na143, K3.6, CL101, CO2 36, BUN 15, Cr0.9.Marland KitchenMarland Kitchen BNP was 194... ~  labs 6/09 showed lytes normal x TCO2= 37, & BNP= 512... ~  labs 12/09 showed HCO3- 36,  BNP= 586... rec- same meds, no salt! ~  labs 6/10 showed Na= 146, K= 3.6, CO2= 36, BUN=  14, Creat= 0.9, BNP= 336. ~  labs 9/10 showed Na-147, K=3.7, HCO3=36, BUN=18, Creat=0.9, BNP=484 ~  labs 1/11 showed Na=144, K=3.9, HCO3=35, BUN=17, Creat=0.9, BNP=488 ~  [facility] 4/11 & meds changed by TH> off diuretics & KCl. ~  labs 10/15/09 showed Na=143, K=4.0, HCO3=37, BUN=12, Creat=0.8, BNP=761... restart Lasix, Diamox, KCl (one each). ~  labs 10/25/09 showed HCO3=44, BNP=588... rec> incr Diamox to 2Qpm... ~  labs 8/11 showed HCO3=39, BNP=481... continue same. ~  Labs 5/12 showed HCO3=35, BNP=373... Improved, continue same Rx. ~  Labs 11/12 showed HCO3=37, BNP=351... Stable continue same meds.  ATRIAL FIBRILLATION (ICD-427.31) & SICK SINUS/ TACHY-BRADY SYNDROME (ICD-427.81) - new prob 1/10 w/ eval by DrKlein- rate control strategy on Propranolol w/ [facility]madin [facility]. ~  EKG 1/10 showed AFib, rate~ 60, NSSTTWA... ~  2DEcho 3/10 showed norm LVF w/ EF= 60% & no regional wall motion abn, mild calcif AoV w/ mild AI, mild LA & RA dil w/ incr thickness of interatrial septum c/w lipomatous hypertrophy, PA sys est at 50... ~  Holter monitor 3/10 = AFib w/ rate 45-97...  VENOUS INSUFFICIENCY (ICD-459.81) - mild persist edema despite the sodium restriction, Lasix and Diamox... reviewed recommendation for No Salt, Elevation, TED's, etc...  DIABETES MELLITUS, BORDERLINE (ICD-790.29) - prev on Metformin 500mg /d (held after 4/11 hosp)... ~  last BS=181, HgA1c=6.4 in 11/08... ~  labs 6/09 showed BS= 101, HgA1c= 6.4 ~  labs 12/09 showed BS= 139,  HgA1c= 6.1 ~  labs 6/10 showed BS= 123, A1c= 6.1 ~  7/10: neg ophthal f/u DrHecker- no retinopathy, no macular edema. ~  labs 9/10 showed BS= 93 ~  labs 1/11 showed BS=146, A1c=5.7 ~  labs 5/11 showed BS= 100 on diet alone... ~  labs 8/11 showed BS= 96 ~  Labs 5/12 showed BS= 113, A1c= 5.8 ~  9/12:  Ophthalmology check by DrHecker- no DM retinopathy... ~  Labs 11/12 showed BS= 104  DIVERTICULOSIS OF COLON, IRRITABLE BOWEL SYNDROME & COLONIC  POLYPS (ICD-211.3) ~  colonoscopy was 11/06 by DrStark showing divertics and several polyps (one 20mm adenoma)... f/u 59yrs. ~  colonoscopy 1/10 by DrStark showed divertics (severe in sigmoid), 4mm polyp= tub adenoma... f/u 3 yrs.  BILAT BREAST CANCERS - resected via mastectomies in 1996 and 2002... followed by [facility] annually & seen 8/12> note reviewed.  DEGENERATIVE JOINT DISEASE (ICD-715.90) - w/ hx hyperuricemia on ALLOPURINOL 300mg /d...  Hx of HEADACHE (ICD-784.0)  ANXIETY (ICD-300.00) - prev rec to take Alpraz 0,5mg  Prn but she never filled the Rx. ~  Remeron 7.5mg /d started 4/11 hosp & stopped 5/11 due to lethargy, sleepiness...   Past Surgical  History  Procedure Date  . Double mastectomy   . Knee surgery 2004    RIGHT  . Cataract extraction   . Cholecystectomy   . Mastectomy 1996    right breast for breast cancer  . Mastectomy 1/02    left - Dr Ezzard Standing  . Total knee arthroplasty 6/04    right - Dr Priscille Kluver  . Breast surgery     Outpatient Encounter Prescriptions as of 04/24/2011  Medication Sig Dispense Refill  . acetaZOLAMIDE (DIAMOX) 250 MG tablet TAKE 2 TABLETS DAILY AT 4PM  60 tablet  4  . albuterol (PROAIR HFA) 108 (90 BASE) MCG/ACT inhaler Inhale 1-2 puffs into the lungs every 6 (six) hours as needed.        Marland Kitchen allopurinol (ZYLOPRIM) 300 MG tablet Take 1 tablet (300 mg total) by mouth daily.  30 tablet  6  . aspirin 81 MG tablet Take 81 mg by mouth daily.        . clotrimazole (LOTRIMIN) 1 % cream Apply topically 2 (two) times daily.        Marland Kitchen loperamide (IMODIUM) 2 MG capsule Take 2 mg by mouth as needed.        . meclizine (ANTIVERT) 25 MG tablet Take 25 mg by mouth every 4 (four) hours as needed.        . Multiple Vitamin (MULTIVITAMIN) capsule Take 1 capsule by mouth daily.        . potassium chloride SA (K-DUR,KLOR-CON) 20 MEQ tablet Take 20 mEq by mouth 2 (two) times daily.       . propranolol (INDERAL) 80 MG tablet TAKE 1 TABLET TWICE DAILY  60 tablet  5  .  DISCONTD: furosemide (LASIX) 40 MG tablet Take 1 tablet (40 mg total) by mouth daily.  30 tablet  6  . quinapril (ACCUPRIL) 40 MG tablet Take 40 mg by mouth at bedtime.        . traMADol (ULTRAM) 50 MG tablet Take 1 tablet (50 mg total) by mouth 3 (three) times daily as needed for pain. Maximum dose= 8 tablets per day  50 tablet  5  . warfarin (COUMADIN) 5 MG tablet Take 1 tablet (5 mg total) by mouth daily. As Directed  50 tablet  3    Allergies  Allergen Reactions  . Iohexol      Desc: IV History sheet from prior CT states IV Contrast allergy- 13 hour prep given.     Current Medications, Allergies, Past Medical History, Past Surgical History, Family History, and Social History were reviewed in [employer] record.    Review of Systems         See HPI - all other systems neg except as noted...  The patient complains of dyspnea on exertion and peripheral edema.  The patient denies anorexia, fever, weight loss, weight gain, vision loss, decreased hearing, hoarseness, chest pain, syncope, prolonged cough, headaches, hemoptysis, abdominal pain, melena, hematochezia, severe indigestion/heartburn, hematuria, incontinence, muscle weakness, suspicious skin lesions, transient blindness, difficulty walking, depression, unusual weight change, abnormal bleeding, enlarged lymph nodes, and angioedema.     Objective:   Physical Exam      WD, WN, 77 y/o BF in NAD... GENERAL:  Alert & oriented; pleasant & cooperative... HEENT:  [place]/AT, EOM-wnl, PERRLA, EACs-clear, TMs-wnl, NOSE-clear, THROAT-clear & wnl. NECK:  Supple w/ fairROM; no JVD; normal carotid impulses w/o bruits; no thyromegaly or nodules palpated; no lymphadenopathy. CHEST:  Clear to P & A; without wheezes/ rales/ or rhonchi  heard... HEART:  irregular rhythm; gr 1/6 SEM without rubs or gallops appreciated... ABDOMEN:  Soft & nontender; normal bowel sounds; no organomegaly or masses detected. EXT: without deformities,  mild arthritic changes; no varicose veins/ +venous insuffic/ 1-2+edema. NEURO:  CN's intact; no focal neuro deficits... DERM:  There is a rounded lesion left calf c/w ringworm...   Assessment & Plan:   HYPOXEMIA & CO2 retention>  Much improved on her O2 regularly & w/ diuretic regimen including Acetazolamide, continue same...  HBP>  Controlled on Propranolol, Quinapril, Lasix, & Diamox; continue same...  AFib>  Stable on rate control strategy, no apparent prob w/ tachy or brady; continue same meds...  DM>  Well maintained on diet Rx...  GI>  Divertics, IBS, Polyps>  Stable, we discussed Simethacone Rx for gas...  Hx bilat breast cancers>  Stable, followed by [facility], no known recurrence...  DJD>  Hx hyperuricemia on Allopurinol Rx & no clinical attacks...  Anxiety>  Stable on Prn Alprazolam Rx.Marland KitchenMarland Kitchen

## 2011-05-10 ENCOUNTER — Ambulatory Visit (INDEPENDENT_AMBULATORY_CARE_PROVIDER_SITE_OTHER): Payer: Medicare Other | Admitting: *Deleted

## 2011-05-10 DIAGNOSIS — I4891 Unspecified atrial fibrillation: Secondary | ICD-10-CM

## 2011-05-10 DIAGNOSIS — Z7901 Long term (current) use of anticoagulants: Secondary | ICD-10-CM

## 2011-05-10 LAB — POCT INR: INR: 2.9

## 2011-05-13 ENCOUNTER — Other Ambulatory Visit: Payer: Self-pay | Admitting: Pulmonary Disease

## 2011-05-21 ENCOUNTER — Telehealth: Payer: Self-pay | Admitting: Pulmonary Disease

## 2011-05-21 NOTE — Telephone Encounter (Signed)
Error.  Cheyenne [name] ° °

## 2011-05-22 ENCOUNTER — Telehealth: Payer: Self-pay | Admitting: Pulmonary Disease

## 2011-05-22 NOTE — Telephone Encounter (Signed)
Called and spoke with pt and she will come in on Thursday at 3pm for her qualifying oxygen.

## 2011-05-26 ENCOUNTER — Other Ambulatory Visit: Payer: Self-pay | Admitting: Pulmonary Disease

## 2011-05-28 ENCOUNTER — Telehealth: Payer: Self-pay | Admitting: Pulmonary Disease

## 2011-05-28 MED ORDER — HYDROCODONE-HOMATROPINE 5-1.5 MG/5ML PO SYRP: 5.0000 mL | ORAL_SOLUTION | Freq: Four times a day (QID) | ORAL | Status: DC | PRN

## 2011-05-28 NOTE — Telephone Encounter (Signed)
Pt is aware that meds have been called to the pharmacy

## 2011-05-28 NOTE — Telephone Encounter (Signed)
Patient already has message.  No need for message.

## 2011-06-07 ENCOUNTER — Telehealth: Payer: Self-pay | Admitting: Pulmonary Disease

## 2011-06-07 NOTE — Telephone Encounter (Signed)
Called and spoke with the pharmacy and they have already filled this for the pt and have it sitting and waiting for her to pick this up.  Pt is aware

## 2011-06-08 ENCOUNTER — Ambulatory Visit (INDEPENDENT_AMBULATORY_CARE_PROVIDER_SITE_OTHER): Payer: Medicare Other | Admitting: *Deleted

## 2011-06-08 DIAGNOSIS — I4891 Unspecified atrial fibrillation: Secondary | ICD-10-CM

## 2011-06-08 DIAGNOSIS — Z7901 Long term (current) use of anticoagulants: Secondary | ICD-10-CM

## 2011-06-30 ENCOUNTER — Other Ambulatory Visit: Payer: Self-pay | Admitting: Pulmonary Disease

## 2011-07-04 ENCOUNTER — Other Ambulatory Visit: Payer: Self-pay | Admitting: Pulmonary Disease

## 2011-07-06 ENCOUNTER — Telehealth: Payer: Self-pay | Admitting: Pulmonary Disease

## 2011-07-06 ENCOUNTER — Encounter: Payer: Medicare Other | Admitting: *Deleted

## 2011-07-06 MED ORDER — POTASSIUM CHLORIDE CRYS ER 20 MEQ PO TBCR: 20.0000 meq | EXTENDED_RELEASE_TABLET | Freq: Two times a day (BID) | ORAL | Status: DC

## 2011-07-06 NOTE — Telephone Encounter (Signed)
Called and spoke with pt and she is aware that rx has been sent to her pharmacy.   

## 2011-07-10 ENCOUNTER — Ambulatory Visit (INDEPENDENT_AMBULATORY_CARE_PROVIDER_SITE_OTHER): Payer: Medicare Other

## 2011-07-10 DIAGNOSIS — I4891 Unspecified atrial fibrillation: Secondary | ICD-10-CM

## 2011-07-10 DIAGNOSIS — Z7901 Long term (current) use of anticoagulants: Secondary | ICD-10-CM

## 2011-07-12 ENCOUNTER — Encounter: Payer: Self-pay | Admitting: General Practice

## 2011-07-12 ENCOUNTER — Encounter: Payer: Self-pay | Admitting: Gastroenterology

## 2011-07-16 ENCOUNTER — Ambulatory Visit: Payer: Medicare Other | Admitting: Pulmonary Disease

## 2011-07-19 ENCOUNTER — Other Ambulatory Visit: Payer: Self-pay | Admitting: Internal Medicine

## 2011-07-19 MED ORDER — WARFARIN SODIUM 5 MG PO TABS: 5.0000 mg | ORAL_TABLET | ORAL | Status: DC

## 2011-07-23 ENCOUNTER — Other Ambulatory Visit: Payer: Self-pay | Admitting: *Deleted

## 2011-07-23 MED ORDER — WARFARIN SODIUM 5 MG PO TABS: 5.0000 mg | ORAL_TABLET | ORAL | Status: DC

## 2011-07-30 ENCOUNTER — Other Ambulatory Visit: Payer: Self-pay | Admitting: Pulmonary Disease

## 2011-07-31 ENCOUNTER — Encounter: Payer: Medicare Other | Admitting: *Deleted

## 2011-08-06 ENCOUNTER — Ambulatory Visit (INDEPENDENT_AMBULATORY_CARE_PROVIDER_SITE_OTHER): Payer: Medicare Other | Admitting: Pharmacist

## 2011-08-06 DIAGNOSIS — Z7901 Long term (current) use of anticoagulants: Secondary | ICD-10-CM

## 2011-08-06 DIAGNOSIS — I4891 Unspecified atrial fibrillation: Secondary | ICD-10-CM

## 2011-09-04 ENCOUNTER — Ambulatory Visit (INDEPENDENT_AMBULATORY_CARE_PROVIDER_SITE_OTHER): Payer: Medicare Other

## 2011-09-04 DIAGNOSIS — Z7901 Long term (current) use of anticoagulants: Secondary | ICD-10-CM

## 2011-09-04 DIAGNOSIS — I4891 Unspecified atrial fibrillation: Secondary | ICD-10-CM

## 2011-09-04 LAB — POCT INR: INR: 2.8

## 2011-10-05 ENCOUNTER — Ambulatory Visit (INDEPENDENT_AMBULATORY_CARE_PROVIDER_SITE_OTHER): Payer: Medicare Other

## 2011-10-05 DIAGNOSIS — I4891 Unspecified atrial fibrillation: Secondary | ICD-10-CM

## 2011-10-05 DIAGNOSIS — Z7901 Long term (current) use of anticoagulants: Secondary | ICD-10-CM

## 2011-10-05 LAB — POCT INR: INR: 2.6

## 2011-10-23 ENCOUNTER — Encounter: Payer: Self-pay | Admitting: Pulmonary Disease

## 2011-10-23 ENCOUNTER — Other Ambulatory Visit (INDEPENDENT_AMBULATORY_CARE_PROVIDER_SITE_OTHER): Payer: Medicare Other

## 2011-10-23 ENCOUNTER — Ambulatory Visit: Payer: Medicare Other | Admitting: Pulmonary Disease

## 2011-10-23 ENCOUNTER — Ambulatory Visit (INDEPENDENT_AMBULATORY_CARE_PROVIDER_SITE_OTHER): Payer: Medicare Other | Admitting: Pulmonary Disease

## 2011-10-23 VITALS — BP 138/88 | HR 74 | Temp 97.9°F | Ht 63.0 in | Wt 158.0 lb

## 2011-10-23 DIAGNOSIS — R7309 Other abnormal glucose: Secondary | ICD-10-CM

## 2011-10-23 DIAGNOSIS — M1990 Unspecified osteoarthritis, unspecified site: Secondary | ICD-10-CM

## 2011-10-23 DIAGNOSIS — R0600 Dyspnea, unspecified: Secondary | ICD-10-CM

## 2011-10-23 DIAGNOSIS — F411 Generalized anxiety disorder: Secondary | ICD-10-CM

## 2011-10-23 DIAGNOSIS — R0902 Hypoxemia: Secondary | ICD-10-CM

## 2011-10-23 DIAGNOSIS — I10 Essential (primary) hypertension: Secondary | ICD-10-CM

## 2011-10-23 DIAGNOSIS — Z853 Personal history of malignant neoplasm of breast: Secondary | ICD-10-CM

## 2011-10-23 DIAGNOSIS — K5730 Diverticulosis of large intestine without perforation or abscess without bleeding: Secondary | ICD-10-CM

## 2011-10-23 DIAGNOSIS — R0989 Other specified symptoms and signs involving the circulatory and respiratory systems: Secondary | ICD-10-CM

## 2011-10-23 DIAGNOSIS — I4891 Unspecified atrial fibrillation: Secondary | ICD-10-CM

## 2011-10-23 DIAGNOSIS — K589 Irritable bowel syndrome without diarrhea: Secondary | ICD-10-CM

## 2011-10-23 DIAGNOSIS — D126 Benign neoplasm of colon, unspecified: Secondary | ICD-10-CM

## 2011-10-23 DIAGNOSIS — I872 Venous insufficiency (chronic) (peripheral): Secondary | ICD-10-CM

## 2011-10-23 LAB — CBC WITH DIFFERENTIAL/PLATELET
Basophils Absolute: 0 10*3/uL (ref 0.0–0.1)
Eosinophils Relative: 5.4 % — ABNORMAL HIGH (ref 0.0–5.0)
HCT: 38.5 % (ref 36.0–46.0)
Hemoglobin: 11.9 g/dL — ABNORMAL LOW (ref 12.0–15.0)
Lymphs Abs: 1.1 10*3/uL (ref 0.7–4.0)
MCV: 98.2 fl (ref 78.0–100.0)
Monocytes Absolute: 0.6 10*3/uL (ref 0.1–1.0)
Monocytes Relative: 7.9 % (ref 3.0–12.0)
Neutro Abs: 5.4 10*3/uL (ref 1.4–7.7)
Platelets: 161 10*3/uL (ref 150.0–400.0)
RDW: 15.5 % — ABNORMAL HIGH (ref 11.5–14.6)

## 2011-10-23 LAB — BASIC METABOLIC PANEL
Calcium: 9.6 mg/dL (ref 8.4–10.5)
GFR: 78.76 mL/min (ref >60.00)
Potassium: 4.2 mEq/L (ref 3.5–5.1)
Sodium: 144 mEq/L (ref 135–145)

## 2011-10-23 LAB — TSH: TSH: 1.96 u[IU]/mL (ref 0.35–5.50)

## 2011-10-23 LAB — HEPATIC FUNCTION PANEL
AST: 27 U/L (ref 0–37)
Albumin: 3.7 g/dL (ref 3.5–5.2)
Alkaline Phosphatase: 64 U/L (ref 39–117)
Total Bilirubin: 0.7 mg/dL (ref 0.3–1.2)

## 2011-10-23 MED ORDER — OXYBUTYNIN CHLORIDE ER 10 MG PO TB24: 10.0000 mg | ORAL_TABLET | Freq: Every day | ORAL | Status: DC

## 2011-10-23 NOTE — Patient Instructions (Signed)
Today we updated your med list in our EPIC system...    Continue your current medications the same...  We decided to try DITROPAN XL 10mg  1/2 to 1 tab daily for bladder symptoms...    Let me know how this is working for you...  Today we did your follow up Blood work...    We will call you w/ the results when avail...  Stay as ACTIVE as possible (remember- it is the "fountain of youth")  Call for any questions...  Let's plan a follow up visit in another 6 months.Marland KitchenMarland Kitchen

## 2011-10-23 NOTE — Progress Notes (Addendum)
Subjective:    Patient ID: Cheyenne [name], female    DOB: [birth year], 78 y.o.   MRN: [id number]  HPI 78 y/o BF here for a follow up visit... she has multiple medical problems as noted below>>>  ~  May11:  she was adm 4/27-28/11 w/ confusion, weakness w/ fall (no signif trauma), ?dehydration, depresssed over the passing of her sister... she's had some intermittent SOB, not resting well & daughter does not want sleeping pills due to prev reaction... they will try Tylenol PM & consider 1/2 of the Alpraz0.5mg ... protimes monitored in the CC now... on one Lasix & K20 Qam, & one Diamox Qpm> f/u labs shows HCO3=44, BNP=588... rec> incr the Diamox to 2 Qpm.. ~  Jun11:  she is improved- feeling better overall... resting better, appetite has returned, etc... weight 157# is down 10# from 1/11 and edema improved down to trace at present... BP controlled on meds;  chr AFib w/ rate control strategy & Coumadin via CC;  BS OK on diet alone...  we reviewed diuretic regimen of Lasix 40mg AM, KCl , Acetazolamide 250mg - 2tabsPM... ~  Dec11:  she continues stable over the last few months-  BP controlled, AFib w/ rate control strategy & coumadin, otherw stable & she likes the Liq Oxygen w/ incr mobility... OK Flu shot today.  ~  July 25, 2010:   Pt noticed a rash on her left calf area & mentioned it to the staff at the coumadin clinic;  they told her to f/u w/ me & set up this appt for her;  she has a ringworm (tinea corporis) on her calf & we discussed this> rx w/ generic LOTRIMIN AF cream...  She reports otherw stable on her port O2, diuretic Rx & same meds (see below)>>>  ~  Oct 24, 2010:  45mo ROV & everything appears stable> he CC is "gas" & we discussed anti-gas Rx w/ Mylicon/ Simethacone/ Phazyme;  Breathing stable on rx w/ O2, Proair, Diamox diuretic;  BP controlled- denies CP, palpit, ch in SOb or edema;  Stable AFib on rate control strategy;  DM well controlled on diet alone;  Other problems as noted  below>  Labs today look good!  ~  April 24, 2011:  45mo ROV and her CC is some left shoulder pain, incr w/ certain movements, taking OTC Tylenol etc; she has known hx DJD & CSpine dis w/ spinal stenosis C1-2 on prev MRI- doesn't want neurosurg eval just incr pain Rx & we will try Tramadol prn...  Breathing stable & labs look ok on Lasix/ Diamox/ KCl;  94% sat on 2L/min by [place];  BP controlled on her med regimen (see below)- continue same;  Hx AFib followed by drKlein on Coumadin via CC;  BS controlled on diet alone, wt=158# which is down 5# in the last 45mo;  She has had f/u [facility] 8/12 for f/u breast cancers- no evid recurrence;  See prob list below>>  ~  Oct 23, 2011:  45mo ROV & Mara is c/o no energy, otherw stable w/o other symptomatology; we reviewed prob list, meds, xrays & labs>  See below>> LABS 5/13:  Chems- ok w/ HCO3=39 on Lasix+Diamox;  CBC- ok w/ Hg=11.9;  TSH=1.96;  BNP=534... 2DEcho 5/13 showed normal LVF w/ EF=55%, mild AI & MR, mild LA & RA dilatation, PAsys=49...          Problem List:  ALLERGIC RHINITIS (ICD-477.9) - on FLONASE Qhs & ZYRTEK QAM Prn.  Hx of HYPOXEMIA (ICD-799.02) - full eval  for hypoxemia in [id number]  ~  7/08:  CXR= cardiomegaly, ectatic Ao, pulm ven HTN... CTAngio= neg, without PE, min scarring in lingula... 2DEcho= norm LVF w/ EF=55%, LVwall thickness at upper limits, mild ca++ AoV w/ mild AI,  PA sys pressure = ... O2 sats 90-94 at rest on RA... w/ ambulation in the office sats drop to 85%... she didn't yet want to go on oxygen at home... just occas SOB w/ activity- she is too sedentary and needs to incr her exercise program. ~  CTChest 6/09 showed bilat mastectomies, no adenopathy, no fluid, 2 sm nodules 4-17mm size RML/LLL, NAD... ~  6/10: c/o increased DOE & no energy- O2 sat= 91% rest & 86% w/activ... rec> home O2 1L/min rest, 2L/min exercise. ~  4/11 hosp> V/Q scan w/ normal vent & norm perfusion... ~  8/11:  pt requesting change to LiqO2 to incr  mobility. ~  11/12:  O2 sat= 94% at rest on 2L/min oxygen via [place]... remains stable.  HYPERTENSION (ICD-401.9) - controlled on PROPRANOLOL 80mg Bid, QUINAPRIL 40mg /d; and back on LASIX 40mg AM, KCL , & ACETAZOLAMIDE 250mg - 2tabsPM... she knows to avoid sodium etc... ~  labs 2/09 showed Na143, K3.6, CL101, CO2 36, BUN 15, Cr0.9.Marland KitchenMarland Kitchen BNP was 194... ~  labs 6/09 showed lytes normal x TCO2= 37, & BNP= 512... ~  labs 12/09 showed HCO3- 36,  BNP= 586... rec- same meds, no salt! ~  labs 6/10 showed Na= 146, K= 3.6, CO2= 36, BUN= 14, Creat= 0.9, BNP= 336. ~  labs 9/10 showed Na-147, K=3.7, HCO3=36, BUN=18, Creat=0.9, BNP=484 ~  labs 1/11 showed Na=144, K=3.9, HCO3=35, BUN=17, Creat=0.9, BNP=488 ~  [facility] 4/11 & meds changed by TH> off diuretics & KCl... CXR showed cardiomeg, clear lungs, clips right axilla. ~  labs 10/15/09 showed Na=143, K=4.0, HCO3=37, BUN=12, Creat=0.8, BNP=761... restart Lasix, Diamox, KCl (one each). ~  labs 10/25/09 showed HCO3=44, BNP=588... rec> incr Diamox to 2Qpm... ~  labs 8/11 showed HCO3=39, BNP=481... continue same. ~  Labs 5/12 showed HCO3=35, BNP=373... Improved, continue same Rx. ~  Labs 11/12 showed HCO3=37, BNP=351... Stable continue same meds. ~  5/13: BP= 138/88 & HCO3=39, BUN=17, Creat=0.9, BNP=534... Reminded to take meds daily.  ATRIAL FIBRILLATION (ICD-427.31) & SICK SINUS/ TACHY-BRADY SYNDROME (ICD-427.81) - new prob 1/10 w/ eval by DrKlein- rate control strategy on Propranolol w/ [facility]mad[facility]. ~  7/08:  2DEcho= norm LVF w/ EF=55%, LVwall thickness at upper limits, mild ca++ AoV w/ mild AI,  PA sys pressure = ... ~  EKG 1/10 showed AFib, rate~ 60, NSSTTWA... ~  2DEcho 3/10 showed norm LVF w/ EF= 60% & no regional wall motion abn, mild calcif AoV w/ mild AI, mild LA & RA dil w/ incr thickness of interatrial septum c/w lipomatous hypertrophy, PA sys est at 50... ~  Holter monitor 3/10 = AFib w/ rate 45-97... ~  2DEcho 4/11 in hosp showed  norm LVwall thickness & EF=55%, mild RVdil w/ mod decr RV function, mod TR & PAsys~69... ~  2DEcho 5/13 showed normal LVF w/ EF=55%, mild AI & MR, mild LA & RA dilatation, PAsys=49... Stable.  PULMONARY HYPERTENSION >> Serial 2DEchos showed progressive incr PA sys estimates... But the most recent 2DEcho 5/13 was stable (see above).  VENOUS INSUFFICIENCY (ICD-459.81) - mild persist edema despite the sodium restriction, Lasix and Diamox... reviewed recommendation for No Salt, Elevation, TED's, etc...  DIABETES MELLITUS, BORDERLINE (ICD-790.29) - prev on Metformin 500mg /d (held after 4/11 hosp)... ~  last BS=181, HgA1c=6.4 in 11/08... ~  labs 6/09 showed BS= 101, HgA1c= 6.4 ~  labs 12/09 showed BS= 139,  HgA1c= 6.1 ~  labs 6/10 showed BS= 123, A1c= 6.1 ~  7/10: neg ophthal f/u DrHecker- no retinopathy, no macular edema. ~  labs 9/10 showed BS= 93 ~  labs 1/11 showed BS=146, A1c=5.7 ~  labs 5/11 showed BS= 100 on diet alone... ~  labs 8/11 showed BS= 96 ~  Labs 5/12 showed BS= 113, A1c= 5.8 ~  9/12:  Ophthalmology check by DrHecker- no DM retinopathy... ~  Labs 11/12 showed BS= 104 ~  Labs 5/13 showed BS= 118  DIVERTICULOSIS OF COLON, IRRITABLE BOWEL SYNDROME & COLONIC POLYPS (ICD-211.3) ~  colonoscopy was 11/06 by DrStark showing divertics and several polyps (one 20mm adenoma)... f/u 53yrs. ~  colonoscopy 1/10 by DrStark showed divertics (severe in sigmoid), 4mm polyp= tub adenoma... f/u 3 yrs.  BILAT BREAST CANCERS - resected via mastectomies in 1996 and 2002... followed by [facility] annually & seen 8/12> note reviewed.  DEGENERATIVE JOINT DISEASE (ICD-715.90) - w/ hx hyperuricemia on ALLOPURINOL 300mg /d & has TRAMADOL 50mg  prn pain...  Hx of HEADACHE (ICD-784.0)  ANXIETY (ICD-300.00) - prev rec to take Alpraz 0,5mg  Prn but she never filled the Rx. ~  Remeron 7.5mg /d started 4/11 hosp & stopped 5/11 due to lethargy, sleepiness...   Past Surgical History  Procedure Date  . Double  mastectomy   . Knee surgery 2004    RIGHT  . Cataract extraction   . Cholecystectomy   . Mastectomy 1996    right breast for breast cancer  . Mastectomy 1/02    left - Dr Ezzard Standing  . Total knee arthroplasty 6/04    right - Dr Priscille Kluver  . Breast surgery     Outpatient Encounter Prescriptions as of 10/23/2011  Medication Sig Dispense Refill  . acetaZOLAMIDE (DIAMOX) 250 MG tablet TAKE 2 TABLETS DAILY AT 4PM  60 tablet  4  . albuterol (PROAIR HFA) 108 (90 BASE) MCG/ACT inhaler Inhale 1-2 puffs into the lungs every 6 (six) hours as needed.        Marland Kitchen allopurinol (ZYLOPRIM) 300 MG tablet TAKE 1 TABLET (300 MG TOTAL) BY MOUTH DAILY.  30 tablet  6  . aspirin 81 MG tablet Take 81 mg by mouth daily.        . clotrimazole (LOTRIMIN) 1 % cream Apply topically 2 (two) times daily.        . furosemide (LASIX) 40 MG tablet TAKE 1 TABLET (40 MG TOTAL) BY MOUTH DAILY.  30 tablet  6  . HYDROcodone-homatropine (HYCODAN) 5-1.5 MG/5ML syrup Take 5 mLs by mouth every 6 (six) hours as needed.  120 mL  5  . loperamide (IMODIUM) 2 MG capsule Take 2 mg by mouth as needed.        . meclizine (ANTIVERT) 25 MG tablet Take 25 mg by mouth every 4 (four) hours as needed.        . Multiple Vitamin (MULTIVITAMIN) capsule Take 1 capsule by mouth daily.        . potassium chloride SA (KLOR-CON M20) 20 MEQ tablet Take 1 tablet (20 mEq total) by mouth 2 (two) times daily.  60 tablet  6  . propranolol (INDERAL) 80 MG tablet TAKE 1 TABLET TWICE DAILY  60 tablet  5  . quinapril (ACCUPRIL) 40 MG tablet TAKE 1 TABLET BY MOUTH ONCE A DAY  30 tablet  10  . traMADol (ULTRAM) 50 MG tablet Take 1 tablet (50 mg total) by mouth  3 (three) times daily as needed for pain. Maximum dose= 8 tablets per day  50 tablet  5  . warfarin (COUMADIN) 5 MG tablet Take 1 tablet (5 mg total) by mouth as directed.  50 tablet  3    Allergies  Allergen Reactions  . Iohexol      Desc: IV History sheet from prior CT states IV Contrast allergy- 13 hour prep  given.     Current Medications, Allergies, Past Medical History, Past Surgical History, Family History, and Social History were reviewed in [employer] record.    Review of Systems         See HPI - all other systems neg except as noted...  The patient complains of dyspnea on exertion and peripheral edema.  The patient denies anorexia, fever, weight loss, weight gain, vision loss, decreased hearing, hoarseness, chest pain, syncope, prolonged cough, headaches, hemoptysis, abdominal pain, melena, hematochezia, severe indigestion/heartburn, hematuria, incontinence, muscle weakness, suspicious skin lesions, transient blindness, difficulty walking, depression, unusual weight change, abnormal bleeding, enlarged lymph nodes, and angioedema.     Objective:   Physical Exam      WD, WN, 78 y/o BF in NAD... GENERAL:  Alert & oriented; pleasant & cooperative... HEENT:  [place]/AT, EOM-wnl, PERRLA, EACs-clear, TMs-wnl, NOSE-clear, THROAT-clear & wnl. NECK:  Supple w/ fairROM; no JVD; normal carotid impulses w/o bruits; no thyromegaly or nodules palpated; no lymphadenopathy. CHEST:  Clear to P & A; without wheezes/ rales/ or rhonchi heard... HEART:  irregular rhythm; gr 1/6 SEM without rubs or gallops appreciated... ABDOMEN:  Soft & nontender; normal bowel sounds; no organomegaly or masses detected. EXT: without deformities, mild arthritic changes; no varicose veins/ +venous insuffic/ 1-2+edema. NEURO:  CN's intact; no focal neuro deficits... DERM:  There is a rounded lesion left calf c/w ringworm...   Assessment & Plan:   HYPOXEMIA & CO2 retention>  Stable on her O2 regularly & w/ diuretic regimen including Acetazolamide, continue same... PULM HYPERTENSION ?Etiology>  We rechecked 2DEcho & everything looks stable compared to 32yrs ago, continue same meds for now.  HBP>  Controlled on Propranolol, Quinapril, Lasix, & Diamox; continue same...  AFib>  Stable on rate control  strategy, no apparent prob w/ tachy or brady; continue same meds...  DM>  Well maintained on diet Rx...  GI>  Divertics, IBS, Polyps>  Stable, we discussed Simethacone Rx for gas...  Hx bilat breast cancers>  Stable, followed by [facility], no known recurrence...  DJD>  Hx hyperuricemia on Allopurinol Rx & no clinical attacks...  Anxiety>  Stable on Prn Alprazolam Rx...   Patient's Medications  New Prescriptions   OXYBUTYNIN (DITROPAN-XL) 10 MG 24 HR TABLET    Take 1 tablet (10 mg total) by mouth daily. For bladder symptoms  Previous Medications   ACETAZOLAMIDE (DIAMOX) 250 MG TABLET    TAKE 2 TABLETS DAILY AT 4PM   ALBUTEROL (PROAIR HFA) 108 (90 BASE) MCG/ACT INHALER    Inhale 1-2 puffs into the lungs every 6 (six) hours as needed.     ALLOPURINOL (ZYLOPRIM) 300 MG TABLET    TAKE 1 TABLET (300 MG TOTAL) BY MOUTH DAILY.   ASPIRIN 81 MG TABLET    Take 81 mg by mouth daily.     CLOTRIMAZOLE (LOTRIMIN) 1 % CREAM    Apply topically 2 (two) times daily.     FUROSEMIDE (LASIX) 40 MG TABLET    TAKE 1 TABLET (40 MG TOTAL) BY MOUTH DAILY.   HYDROCODONE-HOMATROPINE (HYCODAN) 5-1.5 MG/5ML SYRUP  Take 5 mLs by mouth every 6 (six) hours as needed.   LOPERAMIDE (IMODIUM) 2 MG CAPSULE    Take 2 mg by mouth as needed.     MECLIZINE (ANTIVERT) 25 MG TABLET    Take 25 mg by mouth every 4 (four) hours as needed.     MULTIPLE VITAMIN (MULTIVITAMIN) CAPSULE    Take 1 capsule by mouth daily.     POTASSIUM CHLORIDE SA (KLOR-CON M20) 20 MEQ TABLET    Take 1 tablet (20 mEq total) by mouth 2 (two) times daily.   PROPRANOLOL (INDERAL) 80 MG TABLET    TAKE 1 TABLET TWICE DAILY   QUINAPRIL (ACCUPRIL) 40 MG TABLET    TAKE 1 TABLET BY MOUTH ONCE A DAY   TRAMADOL (ULTRAM) 50 MG TABLET    Take 1 tablet (50 mg total) by mouth 3 (three) times daily as needed for pain. Maximum dose= 8 tablets per day   WARFARIN (COUMADIN) 5 MG TABLET    Take 1 tablet (5 mg total) by mouth as directed.  Modified Medications   No  medications on file  Discontinued Medications   No medications on file

## 2011-11-02 ENCOUNTER — Other Ambulatory Visit: Payer: Self-pay | Admitting: Pulmonary Disease

## 2011-11-02 DIAGNOSIS — I10 Essential (primary) hypertension: Secondary | ICD-10-CM

## 2011-11-06 ENCOUNTER — Telehealth: Payer: Self-pay | Admitting: Pulmonary Disease

## 2011-11-06 NOTE — Telephone Encounter (Signed)
Spoke to pt she is aware of her echo[facility]  11/08/11@3 :00pm Tobe Sos

## 2011-11-06 NOTE — Telephone Encounter (Signed)
Will forward to LO per her request

## 2011-11-08 ENCOUNTER — Other Ambulatory Visit: Payer: Self-pay

## 2011-11-08 ENCOUNTER — Ambulatory Visit (HOSPITAL_COMMUNITY): Payer: Medicare Other | Attending: Cardiovascular Disease

## 2011-11-08 DIAGNOSIS — I359 Nonrheumatic aortic valve disorder, unspecified: Secondary | ICD-10-CM | POA: Insufficient documentation

## 2011-11-08 DIAGNOSIS — I517 Cardiomegaly: Secondary | ICD-10-CM | POA: Insufficient documentation

## 2011-11-08 DIAGNOSIS — F411 Generalized anxiety disorder: Secondary | ICD-10-CM | POA: Insufficient documentation

## 2011-11-08 DIAGNOSIS — I872 Venous insufficiency (chronic) (peripheral): Secondary | ICD-10-CM | POA: Insufficient documentation

## 2011-11-08 DIAGNOSIS — M1990 Unspecified osteoarthritis, unspecified site: Secondary | ICD-10-CM | POA: Insufficient documentation

## 2011-11-08 DIAGNOSIS — I4891 Unspecified atrial fibrillation: Secondary | ICD-10-CM

## 2011-11-08 DIAGNOSIS — I059 Rheumatic mitral valve disease, unspecified: Secondary | ICD-10-CM | POA: Insufficient documentation

## 2011-11-08 DIAGNOSIS — I10 Essential (primary) hypertension: Secondary | ICD-10-CM | POA: Insufficient documentation

## 2011-11-08 DIAGNOSIS — E119 Type 2 diabetes mellitus without complications: Secondary | ICD-10-CM | POA: Insufficient documentation

## 2011-11-16 ENCOUNTER — Ambulatory Visit (INDEPENDENT_AMBULATORY_CARE_PROVIDER_SITE_OTHER): Payer: Medicare Other | Admitting: *Deleted

## 2011-11-16 DIAGNOSIS — I4891 Unspecified atrial fibrillation: Secondary | ICD-10-CM

## 2011-11-16 DIAGNOSIS — Z7901 Long term (current) use of anticoagulants: Secondary | ICD-10-CM

## 2011-12-01 ENCOUNTER — Other Ambulatory Visit: Payer: Self-pay | Admitting: Pulmonary Disease

## 2011-12-14 ENCOUNTER — Other Ambulatory Visit: Payer: Medicare Other

## 2011-12-14 LAB — HEMOCCULT SLIDES (X 3 CARDS)
Fecal Occult Blood: NEGATIVE
OCCULT 1: NEGATIVE
OCCULT 2: NEGATIVE
OCCULT 4: NEGATIVE
OCCULT 5: NEGATIVE

## 2011-12-22 ENCOUNTER — Other Ambulatory Visit: Payer: Self-pay | Admitting: Pulmonary Disease

## 2011-12-27 ENCOUNTER — Other Ambulatory Visit: Payer: Self-pay | Admitting: Internal Medicine

## 2011-12-27 MED ORDER — WARFARIN SODIUM 5 MG PO TABS: ORAL_TABLET | ORAL | Status: DC

## 2011-12-28 ENCOUNTER — Ambulatory Visit (INDEPENDENT_AMBULATORY_CARE_PROVIDER_SITE_OTHER): Payer: Medicare Other

## 2011-12-28 DIAGNOSIS — Z7901 Long term (current) use of anticoagulants: Secondary | ICD-10-CM

## 2011-12-28 DIAGNOSIS — I4891 Unspecified atrial fibrillation: Secondary | ICD-10-CM

## 2011-12-28 MED ORDER — WARFARIN SODIUM 5 MG PO TABS: ORAL_TABLET | ORAL | Status: DC

## 2011-12-31 ENCOUNTER — Other Ambulatory Visit: Payer: Self-pay | Admitting: Pulmonary Disease

## 2012-01-02 ENCOUNTER — Encounter (INDEPENDENT_AMBULATORY_CARE_PROVIDER_SITE_OTHER): Payer: Self-pay | Admitting: Surgery

## 2012-02-08 ENCOUNTER — Ambulatory Visit (INDEPENDENT_AMBULATORY_CARE_PROVIDER_SITE_OTHER): Payer: Medicare Other

## 2012-02-08 DIAGNOSIS — Z7901 Long term (current) use of anticoagulants: Secondary | ICD-10-CM

## 2012-02-08 DIAGNOSIS — I4891 Unspecified atrial fibrillation: Secondary | ICD-10-CM

## 2012-02-08 LAB — POCT INR: INR: 2.8

## 2012-02-27 ENCOUNTER — Other Ambulatory Visit: Payer: Self-pay | Admitting: Pulmonary Disease

## 2012-03-14 ENCOUNTER — Encounter: Payer: Self-pay | Admitting: Gastroenterology

## 2012-03-24 ENCOUNTER — Ambulatory Visit (INDEPENDENT_AMBULATORY_CARE_PROVIDER_SITE_OTHER): Payer: Medicare Other | Admitting: *Deleted

## 2012-03-24 DIAGNOSIS — I4891 Unspecified atrial fibrillation: Secondary | ICD-10-CM

## 2012-03-24 DIAGNOSIS — Z7901 Long term (current) use of anticoagulants: Secondary | ICD-10-CM

## 2012-03-24 LAB — POCT INR: INR: 3

## 2012-04-22 ENCOUNTER — Encounter: Payer: Self-pay | Admitting: *Deleted

## 2012-04-22 ENCOUNTER — Ambulatory Visit (INDEPENDENT_AMBULATORY_CARE_PROVIDER_SITE_OTHER): Payer: Medicare Other | Admitting: Pulmonary Disease

## 2012-04-22 ENCOUNTER — Encounter: Payer: Self-pay | Admitting: Pulmonary Disease

## 2012-04-22 ENCOUNTER — Other Ambulatory Visit (INDEPENDENT_AMBULATORY_CARE_PROVIDER_SITE_OTHER): Payer: Medicare Other

## 2012-04-22 VITALS — BP 128/82 | HR 63 | Temp 97.2°F | Ht 63.0 in | Wt 149.6 lb

## 2012-04-22 DIAGNOSIS — R0989 Other specified symptoms and signs involving the circulatory and respiratory systems: Secondary | ICD-10-CM

## 2012-04-22 DIAGNOSIS — R51 Headache: Secondary | ICD-10-CM

## 2012-04-22 DIAGNOSIS — R0600 Dyspnea, unspecified: Secondary | ICD-10-CM

## 2012-04-22 DIAGNOSIS — M1990 Unspecified osteoarthritis, unspecified site: Secondary | ICD-10-CM

## 2012-04-22 DIAGNOSIS — Z853 Personal history of malignant neoplasm of breast: Secondary | ICD-10-CM

## 2012-04-22 DIAGNOSIS — R350 Frequency of micturition: Secondary | ICD-10-CM

## 2012-04-22 DIAGNOSIS — K589 Irritable bowel syndrome without diarrhea: Secondary | ICD-10-CM

## 2012-04-22 DIAGNOSIS — K5730 Diverticulosis of large intestine without perforation or abscess without bleeding: Secondary | ICD-10-CM

## 2012-04-22 DIAGNOSIS — I872 Venous insufficiency (chronic) (peripheral): Secondary | ICD-10-CM

## 2012-04-22 DIAGNOSIS — D126 Benign neoplasm of colon, unspecified: Secondary | ICD-10-CM

## 2012-04-22 DIAGNOSIS — Z23 Encounter for immunization: Secondary | ICD-10-CM

## 2012-04-22 DIAGNOSIS — F411 Generalized anxiety disorder: Secondary | ICD-10-CM

## 2012-04-22 DIAGNOSIS — I10 Essential (primary) hypertension: Secondary | ICD-10-CM

## 2012-04-22 DIAGNOSIS — I4891 Unspecified atrial fibrillation: Secondary | ICD-10-CM

## 2012-04-22 LAB — BASIC METABOLIC PANEL
CO2: 34 mEq/L — ABNORMAL HIGH (ref 19–32)
Calcium: 9.4 mg/dL (ref 8.4–10.5)
Sodium: 143 mEq/L (ref 135–145)

## 2012-04-22 MED ORDER — ALBUTEROL SULFATE HFA 108 (90 BASE) MCG/ACT IN AERS: 1.0000 | INHALATION_SPRAY | Freq: Four times a day (QID) | RESPIRATORY_TRACT | Status: DC | PRN

## 2012-04-22 MED ORDER — QUINAPRIL HCL 40 MG PO TABS: 40.0000 mg | ORAL_TABLET | Freq: Every day | ORAL | Status: DC

## 2012-04-22 MED ORDER — TRAMADOL HCL 50 MG PO TABS: 50.0000 mg | ORAL_TABLET | Freq: Three times a day (TID) | ORAL | Status: DC | PRN

## 2012-04-22 NOTE — Patient Instructions (Addendum)
Today we updated your med list in our EPIC system...    Continue your current medications the same...  Today we rechecked your Metabolic panel...    We will call you w/ the results when avail...  We will arrange for a Urology consultation due to your urinary symptoms...  We will also set up an appt w/ DrStark so you can discuss a follow up colonoscopy...  Call for any questions...  Let's plan a follow up visit in  6 months w/ FASTING blood work at that time.Marland KitchenMarland Kitchen

## 2012-04-28 ENCOUNTER — Other Ambulatory Visit: Payer: Self-pay | Admitting: Pulmonary Disease

## 2012-04-30 ENCOUNTER — Ambulatory Visit: Payer: Medicare Other | Admitting: Gastroenterology

## 2012-05-02 ENCOUNTER — Ambulatory Visit (INDEPENDENT_AMBULATORY_CARE_PROVIDER_SITE_OTHER): Payer: Medicare Other | Admitting: *Deleted

## 2012-05-02 DIAGNOSIS — I4891 Unspecified atrial fibrillation: Secondary | ICD-10-CM

## 2012-05-02 DIAGNOSIS — Z7901 Long term (current) use of anticoagulants: Secondary | ICD-10-CM

## 2012-05-02 LAB — POCT INR: INR: 3.4

## 2012-05-11 ENCOUNTER — Encounter: Payer: Self-pay | Admitting: Pulmonary Disease

## 2012-05-11 NOTE — Progress Notes (Signed)
Subjective:    Patient ID: Cheyenne [name], female    DOB: [birth year], 78 y.o.   MRN: [id number]  HPI 78 y/o BF here for a follow up visit... she has multiple medical problems as noted below>>>  ~  May11:  she was adm 4/27-28/11 w/ confusion, weakness w/ fall (no signif trauma), ?dehydration, depresssed over the passing of her sister... she's had some intermittent SOB, not resting well & daughter does not want sleeping pills due to prev reaction... they will try Tylenol PM & consider 1/2 of the Alpraz0.5mg ... protimes monitored in the CC now... on one Lasix & K20 Qam, & one Diamox Qpm> f/u labs shows HCO3=44, BNP=588... rec> incr the Diamox to 2 Qpm.. ~  Jun11:  she is improved- feeling better overall... resting better, appetite has returned, etc... weight 157# is down 10# from 1/11 and edema improved down to trace at present... BP controlled on meds;  chr AFib w/ rate control strategy & Coumadin via CC;  BS OK on diet alone...  we reviewed diuretic regimen of Lasix 40mg AM, KCl , Acetazolamide 250mg - 2tabsPM... ~  Dec11:  she continues stable over the last few months-  BP controlled, AFib w/ rate control strategy & coumadin, otherw stable & she likes the Liq Oxygen w/ incr mobility... OK Flu shot today.  ~  July 25, 2010:   Pt noticed a rash on her left calf area & mentioned it to the staff at the coumadin clinic;  they told her to f/u w/ me & set up this appt for her;  she has a ringworm (tinea corporis) on her calf & we discussed this> rx w/ generic LOTRIMIN AF cream...  She reports otherw stable on her port O2, diuretic Rx & same meds (see below)>>>  ~  Oct 24, 2010:  11mo ROV & everything appears stable> he CC is "gas" & we discussed anti-gas Rx w/ Mylicon/ Simethacone/ Phazyme;  Breathing stable on rx w/ O2, Proair, Diamox diuretic;  BP controlled- denies CP, palpit, ch in SOb or edema;  Stable AFib on rate control strategy;  DM well controlled on diet alone;  Other problems as noted  below>  Labs today look good!  ~  April 24, 2011:  5768mo ROV and her CC is some left shoulder pain, incr w/ certain movements, taking OTC Tylenol etc; she has known hx DJD & CSpine dis w/ spinal stenosis C1-2 on prev MRI- doesn't want neurosurg eval just incr pain Rx & we will try Tramadol prn...  Breathing stable & labs look ok on Lasix/ Diamox/ KCl;  94% sat on 2L/min by [place];  BP controlled on her med regimen (see below)- continue same;  Hx AFib followed by drKlein on Coumadin via CC;  BS controlled on diet alone, wt=158# which is down 5# in the last 5768mo;  She has had f/u [facility] 8/12 for f/u breast cancers- no evid recurrence;  See prob list below>>  ~  Oct 23, 2011:  5768mo ROV & [name] is c/o no energy, otherw stable w/o other symptomatology; we reviewed prob list, meds, xrays & labs>  See below>> LABS 5/13:  Chems- ok w/ HCO3=39 on Lasix+Diamox;  CBC- ok w/ Hg=11.9;  TSH=1.96;  BNP=534... 2DEcho 5/13 showed normal LVF w/ EF=55%, mild AI & MR, mild LA & RA dilatation, PAsys=49...  ~  April 22, 2012:  5768mo ROV & [name] has had a stable interval- no new complaints or concerns;  We reviewed the following problems today:    <  Hypoxemia, PulmHTN> on HomeO2 & Hycodan cough syrup; she feels her breathing is at baseline; last 2DEcho w/ PAsys~50 & stable...    <HBP> on ASA81, Propran80Bid, Quinapril40, Lasix40, Diamox250-2/d, & K20; BP= 128/82 & she denies CP, palpit, ch in SOB or edema...    <AFib, SSS, Tachy-brady> on Coumadin (via CC); followed by DrKlein for Cards/EP...    <DM> on diet alone; she is down 8# to 150# today; BS is normal at 97...    <GI- Divertics, IBS, Polyps> they received a letter from DrStark 7 daugh wants her to set up appt to see him...    <GU- bladder symptoms> sounds like urge/stress incont- trial Oxybutynin10mg  7 refer to Urology...    <Hx bilat breast cancers> s/p mastectomies in 1996 & 2002, followed by [facility] & doing well w/o known recurrence...    <DJD> on Allopurinol  300, Tramadol50; stable w/ mod DJD & limited mobility (knees, hips)...    <Early dementia, anxiety> Aware- family controls her meds 7 helps at home... We reviewed prob list, meds, xrays and labs> see below for updates >>  LABS 11/13:  Chems- ok w/ K=3.7 Co2=34 BUN=23 Creat=0.9;  BNP=470...           Problem List:  ALLERGIC RHINITIS (ICD-477.9) - on FLONASE Qhs & ZYRTEK QAM Prn.  Hx of HYPOXEMIA (ICD-799.02) - full eval for hypoxemia in [id number]  ~  7/08:  CXR= cardiomegaly, ectatic Ao, pulm ven HTN... CTAngio= neg, without PE, min scarring in lingula... 2DEcho= norm LVF w/ EF=55%, LVwall thickness at upper limits, mild ca++ AoV w/ mild AI,  PA sys pressure = ... O2 sats 90-94 at rest on RA... w/ ambulation in the office sats drop to 85%... she didn't yet want to go on oxygen at home... just occas SOB w/ activity- she is too sedentary and needs to incr her exercise program. ~  CTChest 6/09 showed bilat mastectomies, no adenopathy, no fluid, 2 sm nodules 4-2mm size RML/LLL, NAD... ~  6/10: c/o increased DOE & no energy- O2 sat= 91% rest & 86% w/activ... rec> home O2 1L/min rest, 2L/min exercise. ~  4/11 hosp> V/Q scan w/ normal vent & norm perfusion... ~  8/11:  pt requesting change to LiqO2 to incr mobility. ~  11/12:  O2 sat= 94% at rest on 2L/min oxygen via [place]... remains stable. ~  11/13:  O2 sat= 100% on 2L/min at rest...  HYPERTENSION (ICD-401.9) - controlled on PROPRANOLOL 80mg Bid, QUINAPRIL 40mg /d; and back on LASIX 40mg AM, KCL , & ACETAZOLAMIDE 250mg - 2tabsPM... she knows to avoid sodium etc... ~  labs 2/09 showed Na143, K3.6, CL101, CO2 36, BUN 15, Cr0.9.Marland KitchenMarland Kitchen BNP was 194... ~  labs 6/09 showed lytes normal x TCO2= 37, & BNP= 512... ~  labs 12/09 showed HCO3- 36,  BNP= 586... rec- same meds, no salt! ~  labs 6/10 showed Na= 146, K= 3.6, CO2= 36, BUN= 14, Creat= 0.9, BNP= 336. ~  labs 9/10 showed Na-147, K=3.7, HCO3=36, BUN=18, Creat=0.9, BNP=484 ~  labs 1/11 showed Na=144,  K=3.9, HCO3=35, BUN=17, Creat=0.9, BNP=488 ~  [facility] 4/11 & meds changed by TH> off diuretics & KCl... CXR showed cardiomeg, clear lungs, clips right axilla. ~  labs 10/15/09 showed Na=143, K=4.0, HCO3=37, BUN=12, Creat=0.8, BNP=761... restart Lasix, Diamox, KCl (one each). ~  labs 10/25/09 showed HCO3=44, BNP=588... rec> incr Diamox to 2Qpm... ~  labs 8/11 showed HCO3=39, BNP=481... continue same. ~  Labs 5/12 showed HCO3=35, BNP=373... Improved, continue same Rx. ~  Labs 11/12 showed HCO3=37, BNP=351.Marland KitchenMarland Kitchen  Stable continue same meds. ~  5/13: BP= 138/88 & HCO3=39, BUN=17, Creat=0.9, BNP=534... Reminded to take meds daily. ~  11/3:  on ASA81, Propran80Bid, Quinapril40, Lasix40, Diamox250-2/d, & K20; BP= 128/82 & she denies CP, palpit, ch in SOB or edema...   ATRIAL FIBRILLATION (ICD-427.31) & SICK SINUS/ TACHY-BRADY SYNDROME (ICD-427.81) - new prob 1/10 w/ eval by DrKlein- rate control strategy on Propranolol w/ Coumadin [facility]. ~  7/08:  2DEcho= norm LVF w/ EF=55%, LVwall thickness at upper limits, mild ca++ AoV w/ mild AI,  PA sys pressure = ... ~  EKG 1/10 showed AFib, rate~ 60, NSSTTWA... ~  2DEcho 3/10 showed norm LVF w/ EF= 60% & no regional wall motion abn, mild calcif AoV w/ mild AI, mild LA & RA dil w/ incr thickness of interatrial septum c/w lipomatous hypertrophy, PA sys est at 50... ~  Holter monitor 3/10 = AFib w/ rate 45-97... ~  2DEcho 4/11 in hosp showed norm LVwall thickness & EF=55%, mild RVdil w/ mod decr RV function, mod TR & PAsys~69... ~  2DEcho 5/13 showed normal LVF w/ EF=55%, mild AI & MR, mild LA & RA dilatation, PAsys=49... Stable.  PULMONARY HYPERTENSION >> Serial 2DEchos showed progressive incr PA sys estimates... But the most recent 2DEcho 5/13 was stable (see above).  VENOUS INSUFFICIENCY (ICD-459.81) - mild persist edema despite the sodium restriction, Lasix and Diamox... reviewed recommendation for No Salt, Elevation, TED's, etc...  DIABETES  MELLITUS, BORDERLINE (ICD-790.29) - prev on Metformin 500mg /d (held after 4/11 hosp)... ~  last BS=181, HgA1c=6.4 in 11/08... ~  labs 6/09 showed BS= 101, HgA1c= 6.4 ~  labs 12/09 showed BS= 139,  HgA1c= 6.1 ~  labs 6/10 showed BS= 123, A1c= 6.1 ~  7/10: neg ophthal f/u DrHecker- no retinopathy, no macular edema. ~  labs 9/10 showed BS= 93 ~  labs 1/11 showed BS=146, A1c=5.7 ~  labs 5/11 showed BS= 100 on diet alone... ~  labs 8/11 showed BS= 96 ~  Labs 5/12 showed BS= 113, A1c= 5.8 ~  9/12:  Ophthalmology check by DrHecker- no DM retinopathy... ~  Labs 11/12 showed BS= 104 ~  Labs 5/13 showed BS= 118 ~  Labs 11/13 showed BS= 97  DIVERTICULOSIS OF COLON, IRRITABLE BOWEL SYNDROME & COLONIC POLYPS (ICD-211.3) ~  colonoscopy was 11/06 by DrStark showing divertics and several polyps (one 20mm adenoma)... f/u 82yrs. ~  colonoscopy 1/10 by DrStark showed divertics (severe in sigmoid), 4mm polyp= tub adenoma... f/u 3 yrs.  BILAT BREAST CANCERS - resected via mastectomies in 1996 and 2002... followed by [facility] annually & seen 8/12> note reviewed.  DEGENERATIVE JOINT DISEASE (ICD-715.90) - w/ hx hyperuricemia on ALLOPURINOL 300mg /d & has TRAMADOL 50mg  prn pain...  Hx of HEADACHE (ICD-784.0)  ANXIETY (ICD-300.00) - prev rec to take Alpraz 0,5mg  Prn but she never filled the Rx. ~  Remeron 7.5mg /d started 4/11 hosp & stopped 5/11 due to lethargy, sleepiness...   Past Surgical History  Procedure Date  . Double mastectomy   . Knee surgery 2004    RIGHT  . Cataract extraction   . Cholecystectomy   . Mastectomy 1996    right breast for breast cancer  . Mastectomy 1/02    left - Dr Ezzard Standing  . Total knee arthroplasty 6/04    right - Dr Priscille Kluver  . Breast surgery     Outpatient Encounter Prescriptions as of 04/22/2012  Medication Sig Dispense Refill  . acetaZOLAMIDE (DIAMOX) 250 MG tablet TAKE 2 TABLETS DAILY AT 4PM  60 tablet  4  . allopurinol (ZYLOPRIM) 300 MG tablet TAKE 1 TABLET (300  MG TOTAL) BY MOUTH DAILY.  30 tablet  6  . aspirin 81 MG tablet Take 81 mg by mouth daily.        . clotrimazole (LOTRIMIN) 1 % cream Apply topically 2 (two) times daily.        . furosemide (LASIX) 40 MG tablet TAKE 1 TABLET (40 MG TOTAL) BY MOUTH DAILY.  30 tablet  6  . loperamide (IMODIUM) 2 MG capsule Take 2 mg by mouth as needed.        . meclizine (ANTIVERT) 25 MG tablet Take 25 mg by mouth every 4 (four) hours as needed.        . Multiple Vitamin (MULTIVITAMIN) capsule Take 1 capsule by mouth daily.        Marland Kitchen oxybutynin (DITROPAN-XL) 10 MG 24 hr tablet Take 1 tablet (10 mg total) by mouth daily. For bladder symptoms  30 tablet  11  . potassium chloride SA (KLOR-CON M20) 20 MEQ tablet Take 1 tablet (20 mEq total) by mouth 2 (two) times daily.  60 tablet  6  . propranolol (INDERAL) 80 MG tablet TAKE 1 TABLET TWICE DAILY  60 tablet  5  . quinapril (ACCUPRIL) 40 MG tablet Take 1 tablet (40 mg total) by mouth daily.  30 tablet  11  . traMADol (ULTRAM) 50 MG tablet Take 1 tablet (50 mg total) by mouth 3 (three) times daily as needed for pain. Maximum dose= 8 tablets per day  50 tablet  5  . warfarin (COUMADIN) 5 MG tablet Take as directed by coumadin clinic  50 tablet  3  . [DISCONTINUED] HYDROcodone-homatropine (HYCODAN) 5-1.5 MG/5ML syrup Take 5 mLs by mouth every 6 (six) hours as needed.  120 mL  5  . [DISCONTINUED] quinapril (ACCUPRIL) 40 MG tablet TAKE 1 TABLET BY MOUTH ONCE A DAY  30 tablet  10  . [DISCONTINUED] traMADol (ULTRAM) 50 MG tablet Take 1 tablet (50 mg total) by mouth 3 (three) times daily as needed for pain. Maximum dose= 8 tablets per day  50 tablet  5  . albuterol (PROAIR HFA) 108 (90 BASE) MCG/ACT inhaler Inhale 1-2 puffs into the lungs every 6 (six) hours as needed.  1 Inhaler  6  . [DISCONTINUED] albuterol (PROAIR HFA) 108 (90 BASE) MCG/ACT inhaler Inhale 1-2 puffs into the lungs every 6 (six) hours as needed.          Allergies  Allergen Reactions  . Iohexol      Desc:  IV History sheet from prior CT states IV Contrast allergy- 13 hour prep given.     Current Medications, Allergies, Past Medical History, Past Surgical History, Family History, and Social History were reviewed in [employer] record.    Review of Systems         See HPI - all other systems neg except as noted...  The patient complains of dyspnea on exertion and peripheral edema.  The patient denies anorexia, fever, weight loss, weight gain, vision loss, decreased hearing, hoarseness, chest pain, syncope, prolonged cough, headaches, hemoptysis, abdominal pain, melena, hematochezia, severe indigestion/heartburn, hematuria, incontinence, muscle weakness, suspicious skin lesions, transient blindness, difficulty walking, depression, unusual weight change, abnormal bleeding, enlarged lymph nodes, and angioedema.     Objective:   Physical Exam      WD, WN, 78 y/o BF in NAD... GENERAL:  Alert & oriented; pleasant & cooperative... HEENT:  [place]/AT, EOM-wnl,  PERRLA, EACs-clear, TMs-wnl, NOSE-clear, THROAT-clear & wnl. NECK:  Supple w/ fairROM; no JVD; normal carotid impulses w/o bruits; no thyromegaly or nodules palpated; no lymphadenopathy. CHEST:  Clear to P & A; without wheezes/ rales/ or rhonchi heard... HEART:  irregular rhythm; gr 1/6 SEM without rubs or gallops appreciated... ABDOMEN:  Soft & nontender; normal bowel sounds; no organomegaly or masses detected. EXT: without deformities, mild arthritic changes; no varicose veins/ +venous insuffic/ 1-2+edema. NEURO:  CN's intact; no focal neuro deficits... DERM:  There is a rounded lesion left calf c/w ringworm...  RADIOLOGY DATA:  Reviewed in the EPIC EMR & discussed w/ the patient...  LABORATORY DATA:  Reviewed in the EPIC EMR & discussed w/ the patient...   Assessment & Plan:    HYPOXEMIA & CO2 retention>  Stable on her O2 regularly & w/ diuretic regimen including Acetazolamide, continue same... PULM HYPERTENSION  ?Etiology>  We rechecked 2DEcho & everything looks stable compared to 58yrs ago, continue same meds for now.  HBP>  Controlled on Propranolol, Quinapril, Lasix, & Diamox; continue same...  AFib>  Stable on rate control strategy, no apparent prob w/ tachy or brady; continue same meds...  DM>  Well maintained on diet Rx...  GI>  Divertics, IBS, Polyps>  Stable, we discussed Simethacone Rx for gas...  GU> trial Oxybutynin10 for bladder symptoms and refer to Urology per daugh request...  Hx bilat breast cancers>  Stable, followed by [facility], no known recurrence...  DJD>  Hx hyperuricemia on Allopurinol Rx & no clinical attacks...  Anxiety>  Stable on Prn Alprazolam Rx...   Patient's Medications  New Prescriptions   No medications on file  Previous Medications   ACETAZOLAMIDE (DIAMOX) 250 MG TABLET    TAKE 2 TABLETS DAILY AT 4PM   ALLOPURINOL (ZYLOPRIM) 300 MG TABLET    TAKE 1 TABLET (300 MG TOTAL) BY MOUTH DAILY.   ASPIRIN 81 MG TABLET    Take 81 mg by mouth daily.     CLOTRIMAZOLE (LOTRIMIN) 1 % CREAM    Apply topically 2 (two) times daily.     FUROSEMIDE (LASIX) 40 MG TABLET    TAKE 1 TABLET (40 MG TOTAL) BY MOUTH DAILY.   LOPERAMIDE (IMODIUM) 2 MG CAPSULE    Take 2 mg by mouth as needed.     MECLIZINE (ANTIVERT) 25 MG TABLET    Take 25 mg by mouth every 4 (four) hours as needed.     MULTIPLE VITAMIN (MULTIVITAMIN) CAPSULE    Take 1 capsule by mouth daily.     OXYBUTYNIN (DITROPAN-XL) 10 MG 24 HR TABLET    Take 1 tablet (10 mg total) by mouth daily. For bladder symptoms   POTASSIUM CHLORIDE SA (KLOR-CON M20) 20 MEQ TABLET    Take 1 tablet (20 mEq total) by mouth 2 (two) times daily.   PROPRANOLOL (INDERAL) 80 MG TABLET    TAKE 1 TABLET TWICE DAILY   WARFARIN (COUMADIN) 5 MG TABLET    Take as directed by coumadin clinic  Modified Medications   Modified Medication Previous Medication   ALBUTEROL (PROAIR HFA) 108 (90 BASE) MCG/ACT INHALER albuterol (PROAIR HFA) 108 (90 BASE) MCG/ACT  inhaler      Inhale 1-2 puffs into the lungs every 6 (six) hours as needed.    Inhale 1-2 puffs into the lungs every 6 (six) hours as needed.     HYDROCODONE-HOMATROPINE (HYCODAN) 5-1.5 MG/5ML SYRUP HYDROcodone-homatropine (HYCODAN) 5-1.5 MG/5ML syrup      TAKE 1 TEASPOON EVERY 4 HOURS AS NEEDED  FOR COUGH    Take 5 mLs by mouth every 6 (six) hours as needed.   QUINAPRIL (ACCUPRIL) 40 MG TABLET quinapril (ACCUPRIL) 40 MG tablet      Take 1 tablet (40 mg total) by mouth daily.    TAKE 1 TABLET BY MOUTH ONCE A DAY   TRAMADOL (ULTRAM) 50 MG TABLET traMADol (ULTRAM) 50 MG tablet      Take 1 tablet (50 mg total) by mouth 3 (three) times daily as needed for pain. Maximum dose= 8 tablets per day    Take 1 tablet (50 mg total) by mouth 3 (three) times daily as needed for pain. Maximum dose= 8 tablets per day  Discontinued Medications   No medications on file

## 2012-05-20 ENCOUNTER — Ambulatory Visit (INDEPENDENT_AMBULATORY_CARE_PROVIDER_SITE_OTHER): Payer: Medicare Other | Admitting: Gastroenterology

## 2012-05-20 ENCOUNTER — Encounter: Payer: Self-pay | Admitting: Gastroenterology

## 2012-05-20 VITALS — BP 122/70 | HR 68 | Ht 63.0 in | Wt 154.0 lb

## 2012-05-20 DIAGNOSIS — K921 Melena: Secondary | ICD-10-CM

## 2012-05-20 DIAGNOSIS — Z7901 Long term (current) use of anticoagulants: Secondary | ICD-10-CM

## 2012-05-20 DIAGNOSIS — J449 Chronic obstructive pulmonary disease, unspecified: Secondary | ICD-10-CM

## 2012-05-20 DIAGNOSIS — Z86010 Personal history of colonic polyps: Secondary | ICD-10-CM

## 2012-05-20 NOTE — Patient Instructions (Addendum)
We have cancelled your Recall Colonoscopy.   Pease call back if your have any further symptoms of rectal bleeding.

## 2012-05-20 NOTE — Progress Notes (Signed)
History of Present Illness: This is an 78 -year-old female accompanied by her daughter. She had one episode of small amounts of bright red rectal bleeding associated with straining several weeks ago. She's not noted any other episodes of gastrointestinal bleeding. She has irritable bowel syndrome with frequent loose stools and generally takes Imodium daily which was very effective in controlling her symptoms. He has no other gastrointestinal complaints. Home stool Hemoccults were negative in July. She has a history of a tubulovillous adenomatous colon polyp and last underwent surveillance colonoscopy in January 2010 which showed extensive diverticulosis and one tubular adenoma. She is maintained on continuous oxygen COPD and Coumadin atrial fibrillation. Denies weight loss, abdominal pain, constipation, diarrhea, change in stool caliber, melena, nausea, vomiting, dysphagia, reflux symptoms, chest pain.  Review of Systems: Pertinent positive and negative review of systems were noted in the above HPI section. All other review of systems were otherwise negative.  Current Medications, Allergies, Past Medical History, Past Surgical History, Family History and Social History were reviewed in [employer] record.  Physical Exam: General: Well developed , well nourished, elderly with O2 by nasal cannula, no acute distress Head: Normocephalic and atraumatic Eyes:  sclerae anicteric, EOMI Ears: Normal auditory acuity Mouth: No deformity or lesions Neck: Supple, no masses or thyromegaly Lungs: Clear throughout to auscultation Heart: Regular rate and rhythm; no murmurs, rubs or bruits Abdomen: Soft, non tender and non distended. No masses, hepatosplenomegaly or hernias noted. Normal Bowel sounds Rectal: Slightly decreased sphincter tone no external or internal lesions Hemoccult negative brown stool in the vault Musculoskeletal: Symmetrical with no gross deformities  Skin: No lesions on  visible extremities Pulses:  Normal pulses noted Extremities: No clubbing, cyanosis, edema or deformities noted Neurological: Alert oriented x 4, grossly nonfocal Cervical Nodes:  No significant cervical adenopathy Inguinal Nodes: No significant inguinal adenopathy Psychological:  Alert and cooperative. Normal mood and affect  Assessment and Recommendations:  1. Hematochezia, one episode. Hemoccult negative stool and no lesions on DRE today. I suspect she had a benign anorectal source of bleeding. Given the patient's comorbidities and a colonoscopy performed in 2010, she prefers not to proceed with colonoscopy at this time and I think that is reasonable. If she has recurrent episodes of hematochezia we will need to revisit the decision about colonoscopy.   2. Personal history of tubulovillous adenomatous colon polyps. Last colonoscopy 2010. Given her age and comorbidities will not plan for surveillance colonoscopies.  3. Irritable bowel syndrome, diarrhea predominant. Symptoms controlled with Imodium.  4. COPD on continuous home O2  5. Atrial fibrillation maintained on Coumadin anticoagulation

## 2012-05-23 ENCOUNTER — Ambulatory Visit (INDEPENDENT_AMBULATORY_CARE_PROVIDER_SITE_OTHER): Payer: Medicare Other

## 2012-05-23 DIAGNOSIS — Z7901 Long term (current) use of anticoagulants: Secondary | ICD-10-CM

## 2012-05-23 DIAGNOSIS — I4891 Unspecified atrial fibrillation: Secondary | ICD-10-CM

## 2012-05-26 ENCOUNTER — Other Ambulatory Visit: Payer: Self-pay | Admitting: Pulmonary Disease

## 2012-06-20 ENCOUNTER — Ambulatory Visit (INDEPENDENT_AMBULATORY_CARE_PROVIDER_SITE_OTHER): Payer: Medicare Other | Admitting: *Deleted

## 2012-06-20 DIAGNOSIS — I4891 Unspecified atrial fibrillation: Secondary | ICD-10-CM

## 2012-06-20 DIAGNOSIS — Z7901 Long term (current) use of anticoagulants: Secondary | ICD-10-CM

## 2012-06-20 LAB — POCT INR: INR: 3.1

## 2012-06-30 ENCOUNTER — Other Ambulatory Visit: Payer: Self-pay | Admitting: Cardiology

## 2012-07-07 ENCOUNTER — Ambulatory Visit (INDEPENDENT_AMBULATORY_CARE_PROVIDER_SITE_OTHER): Payer: Medicare Other | Admitting: Pharmacist

## 2012-07-07 DIAGNOSIS — I4891 Unspecified atrial fibrillation: Secondary | ICD-10-CM

## 2012-07-07 DIAGNOSIS — Z7901 Long term (current) use of anticoagulants: Secondary | ICD-10-CM

## 2012-07-07 LAB — POCT INR: INR: 2.6

## 2012-07-10 ENCOUNTER — Other Ambulatory Visit: Payer: Self-pay | Admitting: Pulmonary Disease

## 2012-07-30 ENCOUNTER — Telehealth: Payer: Self-pay | Admitting: Pulmonary Disease

## 2012-07-31 NOTE — Telephone Encounter (Signed)
Will forward to Leigh 

## 2012-07-31 NOTE — Telephone Encounter (Signed)
Called and spoke with pt and she is aware that form has been completed by SN and i will place this form in the mail to her today.  Nothing further is needed.

## 2012-08-01 ENCOUNTER — Ambulatory Visit (INDEPENDENT_AMBULATORY_CARE_PROVIDER_SITE_OTHER): Payer: Medicare Other

## 2012-08-01 DIAGNOSIS — I4891 Unspecified atrial fibrillation: Secondary | ICD-10-CM

## 2012-08-01 DIAGNOSIS — Z7901 Long term (current) use of anticoagulants: Secondary | ICD-10-CM

## 2012-08-05 ENCOUNTER — Other Ambulatory Visit: Payer: Self-pay | Admitting: Pulmonary Disease

## 2012-08-22 ENCOUNTER — Ambulatory Visit (INDEPENDENT_AMBULATORY_CARE_PROVIDER_SITE_OTHER): Payer: Medicare Other | Admitting: *Deleted

## 2012-08-22 DIAGNOSIS — Z7901 Long term (current) use of anticoagulants: Secondary | ICD-10-CM

## 2012-08-22 DIAGNOSIS — I4891 Unspecified atrial fibrillation: Secondary | ICD-10-CM

## 2012-08-22 LAB — POCT INR: INR: 3.7

## 2012-09-12 ENCOUNTER — Ambulatory Visit (INDEPENDENT_AMBULATORY_CARE_PROVIDER_SITE_OTHER): Payer: Medicare Other | Admitting: *Deleted

## 2012-09-12 DIAGNOSIS — Z7901 Long term (current) use of anticoagulants: Secondary | ICD-10-CM

## 2012-09-12 DIAGNOSIS — I4891 Unspecified atrial fibrillation: Secondary | ICD-10-CM

## 2012-10-07 ENCOUNTER — Ambulatory Visit (INDEPENDENT_AMBULATORY_CARE_PROVIDER_SITE_OTHER): Payer: Medicare Other | Admitting: *Deleted

## 2012-10-07 DIAGNOSIS — I4891 Unspecified atrial fibrillation: Secondary | ICD-10-CM

## 2012-10-07 DIAGNOSIS — Z7901 Long term (current) use of anticoagulants: Secondary | ICD-10-CM

## 2012-10-07 LAB — POCT INR: INR: 2.2

## 2012-10-20 ENCOUNTER — Other Ambulatory Visit (INDEPENDENT_AMBULATORY_CARE_PROVIDER_SITE_OTHER): Payer: Medicare Other

## 2012-10-20 ENCOUNTER — Ambulatory Visit (INDEPENDENT_AMBULATORY_CARE_PROVIDER_SITE_OTHER)
Admission: RE | Admit: 2012-10-20 | Discharge: 2012-10-20 | Disposition: A | Discharge status: Home / Self Care | Payer: Medicare Other | Source: Ambulatory Visit | Attending: Pulmonary Disease | Admitting: Pulmonary Disease

## 2012-10-20 ENCOUNTER — Ambulatory Visit (INDEPENDENT_AMBULATORY_CARE_PROVIDER_SITE_OTHER): Payer: Medicare Other | Admitting: Pulmonary Disease

## 2012-10-20 ENCOUNTER — Encounter: Payer: Self-pay | Admitting: Pulmonary Disease

## 2012-10-20 VITALS — BP 136/80 | HR 62 | Temp 98.6°F | Ht 63.0 in | Wt 143.6 lb

## 2012-10-20 DIAGNOSIS — K5730 Diverticulosis of large intestine without perforation or abscess without bleeding: Secondary | ICD-10-CM

## 2012-10-20 DIAGNOSIS — I10 Essential (primary) hypertension: Secondary | ICD-10-CM

## 2012-10-20 DIAGNOSIS — E559 Vitamin D deficiency, unspecified: Secondary | ICD-10-CM

## 2012-10-20 DIAGNOSIS — D126 Benign neoplasm of colon, unspecified: Secondary | ICD-10-CM

## 2012-10-20 DIAGNOSIS — R0989 Other specified symptoms and signs involving the circulatory and respiratory systems: Secondary | ICD-10-CM

## 2012-10-20 DIAGNOSIS — I872 Venous insufficiency (chronic) (peripheral): Secondary | ICD-10-CM

## 2012-10-20 DIAGNOSIS — E7800 Pure hypercholesterolemia, unspecified: Secondary | ICD-10-CM

## 2012-10-20 DIAGNOSIS — I4891 Unspecified atrial fibrillation: Secondary | ICD-10-CM

## 2012-10-20 DIAGNOSIS — Z853 Personal history of malignant neoplasm of breast: Secondary | ICD-10-CM

## 2012-10-20 DIAGNOSIS — R0600 Dyspnea, unspecified: Secondary | ICD-10-CM

## 2012-10-20 DIAGNOSIS — M1990 Unspecified osteoarthritis, unspecified site: Secondary | ICD-10-CM

## 2012-10-20 DIAGNOSIS — R0902 Hypoxemia: Secondary | ICD-10-CM

## 2012-10-20 DIAGNOSIS — R7309 Other abnormal glucose: Secondary | ICD-10-CM

## 2012-10-20 DIAGNOSIS — K589 Irritable bowel syndrome without diarrhea: Secondary | ICD-10-CM

## 2012-10-20 DIAGNOSIS — F411 Generalized anxiety disorder: Secondary | ICD-10-CM

## 2012-10-20 LAB — CBC WITH DIFFERENTIAL/PLATELET
Basophils Relative: 0.4 % (ref 0.0–3.0)
Eosinophils Relative: 1.5 % (ref 0.0–5.0)
HCT: 37.4 % (ref 36.0–46.0)
Lymphs Abs: 0.9 10*3/uL (ref 0.7–4.0)
MCV: 92.5 fl (ref 78.0–100.0)
Monocytes Absolute: 0.5 10*3/uL (ref 0.1–1.0)
Neutro Abs: 5.4 10*3/uL (ref 1.4–7.7)
Platelets: 167 10*3/uL (ref 150.0–400.0)
RBC: 4.04 Mil/uL (ref 3.87–5.11)
WBC: 6.9 10*3/uL (ref 4.5–10.5)

## 2012-10-20 LAB — BASIC METABOLIC PANEL
BUN: 16 mg/dL (ref 6–23)
GFR: 82.91 mL/min (ref >60.00)
Potassium: 3.4 mEq/L — ABNORMAL LOW (ref 3.5–5.1)
Sodium: 144 mEq/L (ref 135–145)

## 2012-10-20 LAB — HEPATIC FUNCTION PANEL
AST: 28 U/L (ref 0–37)
Total Bilirubin: 1.1 mg/dL (ref 0.3–1.2)

## 2012-10-20 MED ORDER — MECLIZINE HCL 25 MG PO TABS: 25.0000 mg | ORAL_TABLET | ORAL | Status: DC | PRN

## 2012-10-20 NOTE — Patient Instructions (Addendum)
Today we updated your med list in our EPIC system...    Continue your current medications the same...    We refilled your Antivert per request...  Today we did your follow up CXR & blood work...    We will contact you w/ the results when available...   Stay as active as poss...  Let's plan a follow up visit in 70mo, sooner if needed for problems.Marland KitchenMarland Kitchen

## 2012-10-20 NOTE — Progress Notes (Signed)
Subjective:    Patient ID: Cheyenne [name], female    DOB: [birth year], 79 y.o.   MRN: [id number]  HPI 79 y/o BF here for a follow up visit... she has multiple medical problems as noted below>>>  ~  April 24, 2011:  51mo ROV and her CC is some left shoulder pain, incr w/ certain movements, taking OTC Tylenol etc; she has known hx DJD & CSpine dis w/ spinal stenosis C1-2 on prev MRI- doesn't want neurosurg eval just incr pain Rx & we will try Tramadol prn...  Breathing stable & labs look ok on Lasix/ Diamox/ KCl;  94% sat on 2L/min by [place];  BP controlled on her med regimen (see below)- continue same;  Hx AFib followed by drKlein on Coumadin via CC;  BS controlled on diet alone, wt=158# which is down 5# in the last 51mo;  She has had f/u [facility] 8/12 for f/u breast cancers- no evid recurrence;  See prob list below>>  ~  Oct 23, 2011:  51mo ROV & Cheyenne [name] is c/o no energy, otherw stable w/o other symptomatology; we reviewed prob list, meds, xrays & labs>  See below>> LABS 5/13:  Chems- ok w/ HCO3=39 on Lasix+Diamox;  CBC- ok w/ Hg=11.9;  TSH=1.96;  BNP=534... 2DEcho 5/13 showed normal LVF w/ EF=55%, mild AI & MR, mild LA & RA dilatation, PAsys=49...  ~  April 22, 2012:  51mo ROV & Cheyenne [name] has had a stable interval- no new complaints or concerns;  We reviewed the following problems today:    Hypoxemia, PulmHTN> on HomeO2 & Hycodan cough syrup; she feels her breathing is at baseline; last 2DEcho w/ PAsys~50 & stable...    HBP> on ASA81, Propran80Bid, Quinapril40, Lasix40, Diamox250-2/d, & K20; BP= 128/82 & she denies CP, palpit, ch in SOB or edema...    AFib, SSS, Tachy-brady> on Coumadin (via CC); followed by DrKlein for Cards/EP...    DM> on diet alone; she is down 8# to 150# today; BS is normal at 97...    GI- Divertics, IBS, Polyps> they received a letter from DrStark 7 daugh wants her to set up appt to see him...    GU- bladder symptoms> sounds like urge/stress incont- trial Oxybutynin10mg  7  refer to Urology...    Hx bilat breast cancers> s/p mastectomies in 1996 & 2002, followed by [facility] & doing well w/o known recurrence...    DJD> on Allopurinol 300, Tramadol50; stable w/ mod DJD & limited mobility (knees, hips)...    Early dementia, anxiety> Aware- family controls her meds 7 helps at home... We reviewed prob list, meds, xrays and labs> see below for updates >>  LABS 11/13:  Chems- ok w/ K=3.7 Co2=34 BUN=23 Creat=0.9;  BNP=470...   ~  Oct 20, 2012:  51mo ROV & Cheyenne [name] has been stable- had some recent dizziness, otherw ok;  We reviewed the following medical problems during today's office visit >>     Hypoxemia, PulmHTN> on HomeO2 & Hycodan cough syrup; she feels her breathing is at baseline; last 2DEcho w/ PAsys~50 & stable...    HBP> on ASA81, Propran80Bid, Quinapril40, Lasix40, Diamox250-2/d, & K20Bid; BP= 136/80 & she denies CP, palpit, ch in SOB or edema...    AFib, SSS, Tachy-brady> on Coumadin (via CC); followed by DrKlein for Cards/EP...    DM> on diet alone; she is down 6# to 144# today; BS is normal at 102...    GI- Divertics, IBS, Polyps> on Immodium prn; she had appt w/ DrStark 12/13> IBS, hx divertics &  colon polyps, he did not rec surveillance colonoscopy for her...    GU- bladder symptoms> sounds like urge/stress incont- trial Oxybutynin10mg  & eval by Urology DrManny> rec changing diuretics if poss but it is not poss...    Hx bilat breast cancers> s/p mastectomies in 1996 & 2002, followed by [facility] & doing well w/o known recurrence...    DJD> on Allopurinol 300, Tramadol50; stable w/ mod DJD & limited mobility (knees, hips)...    Early dementia, anxiety> Aware- family controls her meds & helps at home... We reviewed prob list, meds, xrays and labs> see below for updates >>  CXR 5/14 showed cardiomeg, clear lungs, DJD spine, NAD... LABS 5/14:  Chems- ok x K=3.4;  CBC- ok w/ Hg=12.3;  TSH=1.38;  VitD=31;  BNP=463...           Problem List:  ALLERGIC RHINITIS  (ICD-477.9) - on FLONASE Qhs & ZYRTEK QAM Prn.  Hx of HYPOXEMIA (ICD-799.02) - full eval for hypoxemia in [id number]  ~  7/08:  CXR= cardiomegaly, ectatic Ao, pulm ven HTN... CTAngio= neg, without PE, min scarring in lingula... 2DEcho= norm LVF w/ EF=55%, LVwall thickness at upper limits, mild ca++ AoV w/ mild AI,  PA sys pressure = ... O2 sats 90-94 at rest on RA... w/ ambulation in the office sats drop to 85%... she didn't yet want to go on oxygen at home... just occas SOB w/ activity- she is too sedentary and needs to incr her exercise program. ~  CTChest 6/09 showed bilat mastectomies, no adenopathy, no fluid, 2 sm nodules 4-30mm size RML/LLL, NAD... ~  6/10: c/o increased DOE & no energy- O2 sat= 91% rest & 86% w/activ... rec> home O2 1L/min rest, 2L/min exercise. ~  4/11 hosp> V/Q scan w/ normal vent & norm perfusion... CXR 4/11 showed cardiomeg, clear lungs, no CHF, surg clips in right axilla... ~  8/11:  pt requesting change to LiqO2 to incr mobility. ~  11/12:  O2 sat= 94% at rest on 2L/min oxygen via [place]... remains stable. ~  11/13:  O2 sat= 100% on 2L/min at rest... ~  CXR 5/14 showed cardiomeg, clear lungs, DJD spine, NAD  HYPERTENSION (ICD-401.9) - controlled on PROPRANOLOL 80mg Bid, QUINAPRIL 40mg /d; and back on LASIX 40mg AM, KCL , & ACETAZOLAMIDE 250mg - 2tabsPM... she knows to avoid sodium etc... ~  labs 2/09 showed Na143, K3.6, CL101, CO2 36, BUN 15, Cr0.9.Marland KitchenMarland Kitchen BNP was 194... ~  labs 6/09 showed lytes normal x TCO2= 37, & BNP= 512... ~  labs 12/09 showed HCO3- 36,  BNP= 586... rec- same meds, no salt! ~  labs 6/10 showed Na= 146, K= 3.6, CO2= 36, BUN= 14, Creat= 0.9, BNP= 336. ~  labs 9/10 showed Na-147, K=3.7, HCO3=36, BUN=18, Creat=0.9, BNP=484 ~  labs 1/11 showed Na=144, K=3.9, HCO3=35, BUN=17, Creat=0.9, BNP=488 ~  [facility] 4/11 & meds changed by TH> off diuretics & KCl... CXR showed cardiomeg, clear lungs, clips right axilla. ~  labs 10/15/09 showed Na=143, K=4.0, HCO3=37,  BUN=12, Creat=0.8, BNP=761... restart Lasix, Diamox, KCl (one each). ~  labs 10/25/09 showed HCO3=44, BNP=588... rec> incr Diamox to 2Qpm... ~  labs 8/11 showed HCO3=39, BNP=481... continue same. ~  Labs 5/12 showed HCO3=35, BNP=373... Improved, continue same Rx. ~  Labs 11/12 showed HCO3=37, BNP=351... Stable continue same meds. ~  5/13: BP= 138/88 & HCO3=39, BUN=17, Creat=0.9, BNP=534... Reminded to take meds daily. ~  11/3: on ASA81, Propran80Bid, Quinapril40, Lasix40, Diamox250-2/d, & K20; BP= 128/82 & she denies CP, palpit, ch in SOB or edema...  ~  5/14: on ASA81, Propran80Bid, Quinapril40, Lasix40, Diamox250-2/d, & K20Bid; BP= 136/80 & she denies CP, palpit, ch in SOB or edema  ATRIAL FIBRILLATION (ICD-427.31) & SICK SINUS/ TACHY-BRADY SYNDROME (ICD-427.81) - new prob 1/10 w/ eval by DrKlein- rate control strategy on Propranolol w/ [facility]. ~  7/08:  2DEcho= norm LVF w/ EF=55%, LVwall thickness at upper limits, mild ca++ AoV w/ mild AI,  PA sys pressure = ... ~  EKG 1/10 showed AFib, rate~ 60, NSSTTWA... ~  2DEcho 3/10 showed norm LVF w/ EF= 60% & no regional wall motion abn, mild calcif AoV w/ mild AI, mild LA & RA dil w/ incr thickness of interatrial septum c/w lipomatous hypertrophy, PA sys est at 50... ~  Holter monitor 3/10 = AFib w/ rate 45-97... ~  2DEcho 4/11 in hosp showed norm LVwall thickness & EF=55%, mild RVdil w/ mod decr RV function, mod TR & PAsys~69... ~  2DEcho 5/13 showed normal LVF w/ EF=55%, mild AI & MR, mild LA & RA dilatation, PAsys=49... Stable. ~  Stable on Coumadin & followed by DrKlein...  PULMONARY HYPERTENSION >> Serial 2DEchos showed progressive incr PA sys estimates... But the most recent 2DEcho 5/13 was stable (see above). ~  V/Q lung scan 4/11 was neg for PE...  VENOUS INSUFFICIENCY (ICD-459.81) - mild persist edema despite the sodium restriction, Lasix and Diamox... reviewed recommendation for No Salt, Elevation, TED's,  etc...  DIABETES MELLITUS, BORDERLINE (ICD-790.29) - prev on Metformin 500mg /d (held after 4/11 hosp)... ~  last BS=181, HgA1c=6.4 in 11/08... ~  labs 6/09 showed BS= 101, HgA1c= 6.4 ~  labs 12/09 showed BS= 139,  HgA1c= 6.1 ~  labs 6/10 showed BS= 123, A1c= 6.1 ~  7/10: neg ophthal f/u DrHecker- no retinopathy, no macular edema. ~  labs 9/10 showed BS= 93 ~  labs 1/11 showed BS=146, A1c=5.7 ~  labs 5/11 showed BS= 100 on diet alone... ~  labs 8/11 showed BS= 96 ~  Labs 5/12 showed BS= 113, A1c= 5.8 ~  9/12:  Ophthalmology check by DrHecker- no DM retinopathy... ~  Labs 11/12 showed BS= 104 ~  Labs 5/13 showed BS= 118 ~  Labs 11/13 showed BS= 97 ~  2/14: she had a neg Ophthalmology eval by DrHecker... ~  Labs 5/14 showed BS= 102  DIVERTICULOSIS OF COLON, IRRITABLE BOWEL SYNDROME & COLONIC POLYPS (ICD-211.3) ~  colonoscopy was 11/06 by DrStark showing divertics and several polyps (one 20mm adenoma)... f/u 31yrs. ~  colonoscopy 1/10 by DrStark showed divertics (severe in sigmoid), 4mm polyp= tub adenoma... f/u 3 yrs. ~  12/13:  She had GI f/u DrStark> IBS w/ freq loose stools, occas BRB, hx extensive divertics & adenomatous polyps, on coumadin for AFib;   URINARY FREQ&URGENCY >>  ~  11/13: she had a GU eval by DrManny> c/o freq & urgency assoc w/ Lasix, some leakage & managing w/ one pad/d, some better w/ Oxybutynin...  BILAT BREAST CANCERS - resected via mastectomies in 1996 and 2002... followed by [facility] annually & seen 8/12> note reviewed.  DEGENERATIVE JOINT DISEASE (ICD-715.90) - w/ hx hyperuricemia on ALLOPURINOL 300mg /d & has TRAMADOL 50mg  prn pain...  Hx of HEADACHE (ICD-784.0) ~  MRI Brain 4/11 showed atrophy & sm vessel dis, degen changes at C1-2 w/ some sp stenosis caused part by ant slip of the C1 ring, incidental part empty sella...  ANXIETY (ICD-300.00) - prev rec to take Alpraz 0,5mg  Prn but she never filled the Rx. ~  Remeron 7.5mg /d started 4/11 hosp &  stopped 5/11  due to lethargy, sleepiness...   Past Surgical History  Procedure Laterality Date  . Double mastectomy    . Knee surgery  2004    RIGHT  . Cataract extraction    . Cholecystectomy    . Mastectomy  1996    right breast for breast cancer  . Mastectomy  1/02    left - Dr Ezzard Standing  . Total knee arthroplasty  6/04    right - Dr Priscille Kluver    Outpatient Encounter Prescriptions as of 10/20/2012  Medication Sig Dispense Refill  . acetaZOLAMIDE (DIAMOX) 250 MG tablet TAKE 2 TABLETS DAILY AT 4PM  60 tablet  4  . albuterol (PROAIR HFA) 108 (90 BASE) MCG/ACT inhaler Inhale 1-2 puffs into the lungs every 6 (six) hours as needed.  1 Inhaler  6  . allopurinol (ZYLOPRIM) 300 MG tablet TAKE 1 TABLET (300 MG TOTAL) BY MOUTH DAILY.  30 tablet  6  . aspirin 81 MG tablet Take 81 mg by mouth daily.        . clotrimazole (LOTRIMIN) 1 % cream Apply topically 2 (two) times daily.        . furosemide (LASIX) 40 MG tablet TAKE 1 TABLET BY MOUTH DAILY.  30 tablet  6  . HYDROcodone-homatropine (HYCODAN) 5-1.5 MG/5ML syrup TAKE 1 TEASPOON EVERY 4 HOURS AS NEEDED FOR COUGH  120 mL  2  . KLOR-CON M20 20 MEQ tablet TAKE 1 TABLET (20 MEQ TOTAL) BY MOUTH 2 (TWO) TIMES DAILY.  60 tablet  5  . loperamide (IMODIUM) 2 MG capsule Take 2 mg by mouth as needed.        . meclizine (ANTIVERT) 25 MG tablet Take 25 mg by mouth every 4 (four) hours as needed.        . Multiple Vitamin (MULTIVITAMIN) capsule Take 1 capsule by mouth daily.        Marland Kitchen oxybutynin (DITROPAN-XL) 10 MG 24 hr tablet Take 1 tablet (10 mg total) by mouth daily. For bladder symptoms  30 tablet  11  . propranolol (INDERAL) 80 MG tablet TAKE 1 TABLET TWICE DAILY  60 tablet  5  . quinapril (ACCUPRIL) 40 MG tablet Take 1 tablet (40 mg total) by mouth daily.  30 tablet  11  . traMADol (ULTRAM) 50 MG tablet Take 1 tablet (50 mg total) by mouth 3 (three) times daily as needed for pain. Maximum dose= 8 tablets per day  50 tablet  5  . warfarin (COUMADIN) 5 MG tablet TAKE  AS DIRECTED BY COUMADIN CLINIC  50 tablet  3   No facility-administered encounter medications on file as of 10/20/2012.    Allergies  Allergen Reactions  . Iohexol      Desc: IV History sheet from prior CT states IV Contrast allergy- 13 hour prep given.     Current Medications, Allergies, Past Medical History, Past Surgical History, Family History, and Social History were reviewed in [employer] record.    Review of Systems         See HPI - all other systems neg except as noted...  The patient complains of dyspnea on exertion and peripheral edema.  The patient denies anorexia, fever, weight loss, weight gain, vision loss, decreased hearing, hoarseness, chest pain, syncope, prolonged cough, headaches, hemoptysis, abdominal pain, melena, hematochezia, severe indigestion/heartburn, hematuria, incontinence, muscle weakness, suspicious skin lesions, transient blindness, difficulty walking, depression, unusual weight change, abnormal bleeding, enlarged lymph nodes, and angioedema.     Objective:  Physical Exam      WD, WN, 79 y/o BF in NAD... GENERAL:  Alert & oriented; pleasant & cooperative... HEENT:  [place]/AT, EOM-wnl, PERRLA, EACs-clear, TMs-wnl, NOSE-clear, THROAT-clear & wnl. NECK:  Supple w/ fairROM; no JVD; normal carotid impulses w/o bruits; no thyromegaly or nodules palpated; no lymphadenopathy. CHEST:  Clear to P & A; without wheezes/ rales/ or rhonchi heard... HEART:  irregular rhythm; gr 1/6 SEM without rubs or gallops appreciated... ABDOMEN:  Soft & nontender; normal bowel sounds; no organomegaly or masses detected. EXT: without deformities, mild arthritic changes; no varicose veins/ +venous insuffic/ 1-2+edema. NEURO:  CN's intact; no focal neuro deficits... DERM:  There is a rounded lesion left calf c/w ringworm...  RADIOLOGY DATA:  Reviewed in the EPIC EMR & discussed w/ the patient...  LABORATORY DATA:  Reviewed in the EPIC EMR & discussed w/ the  patient...   Assessment & Plan:    HYPOXEMIA & CO2 retention>  Stable on her O2 regularly & w/ diuretic regimen including Acetazolamide, continue same... PULM HYPERTENSION ?Etiology>  We rechecked 2DEcho 5/13 & everything looks stable compared to 81yrs prev, continue same meds for now.  HBP>  Controlled on Propranolol, Quinapril, Lasix, & Diamox; continue same...  AFib>  Stable on rate control strategy, no apparent prob w/ tachy or brady; continue same meds...  DM>  Well maintained on diet Rx...  GI>  Divertics, IBS, Polyps>  Stable, we discussed Simethacone Rx for gas...  GU> trial Oxybutynin10 for bladder symptoms and refer to Urology per daugh request...  Hx bilat breast cancers>  Stable, followed by [facility], no known recurrence...  DJD>  Hx hyperuricemia on Allopurinol Rx & no clinical attacks...  Anxiety>  Stable on Prn Alprazolam Rx...   Patient's Medications  New Prescriptions   No medications on file  Previous Medications   ALBUTEROL (PROAIR HFA) 108 (90 BASE) MCG/ACT INHALER    Inhale 1-2 puffs into the lungs every 6 (six) hours as needed.   ALLOPURINOL (ZYLOPRIM) 300 MG TABLET    TAKE 1 TABLET (300 MG TOTAL) BY MOUTH DAILY.   ASPIRIN 81 MG TABLET    Take 81 mg by mouth daily.     CHOLECALCIFEROL (VITAMIN D) 2000 UNITS CAPS    Take 1 capsule by mouth daily.   FUROSEMIDE (LASIX) 40 MG TABLET    TAKE 1 TABLET BY MOUTH DAILY.   KLOR-CON M20 20 MEQ TABLET    TAKE 1 TABLET (20 MEQ TOTAL) BY MOUTH 2 (TWO) TIMES DAILY.   LOPERAMIDE (IMODIUM) 2 MG CAPSULE    Take 2 mg by mouth as needed.     MULTIPLE VITAMIN (MULTIVITAMIN) CAPSULE    Take 1 capsule by mouth daily.     PROPRANOLOL (INDERAL) 80 MG TABLET    TAKE 1 TABLET TWICE DAILY   QUINAPRIL (ACCUPRIL) 40 MG TABLET    Take 1 tablet (40 mg total) by mouth daily.   TRAMADOL (ULTRAM) 50 MG TABLET    Take 1 tablet (50 mg total) by mouth 3 (three) times daily as needed for pain. Maximum dose= 8 tablets per day  Modified  Medications   Modified Medication Previous Medication   ACETAZOLAMIDE (DIAMOX) 250 MG TABLET acetaZOLAMIDE (DIAMOX) 250 MG tablet      TAKE 2 TABLETS DAILY AT 4PM    TAKE 2 TABLETS DAILY AT 4PM   HYDROCODONE-HOMATROPINE (HYCODAN) 5-1.5 MG/5ML SYRUP HYDROcodone-homatropine (HYCODAN) 5-1.5 MG/5ML syrup      TAKE 1 TEASPOON EVERY 4 HOURS AS NEEDED FOR COUGH  TAKE 1 TEASPOON EVERY 4 HOURS AS NEEDED FOR COUGH   MECLIZINE (ANTIVERT) 25 MG TABLET meclizine (ANTIVERT) 25 MG tablet      Take 1 tablet (25 mg total) by mouth every 4 (four) hours as needed.    Take 25 mg by mouth every 4 (four) hours as needed.     OXYBUTYNIN (DITROPAN-XL) 10 MG 24 HR TABLET oxybutynin (DITROPAN-XL) 10 MG 24 hr tablet      TAKE 1 TABLET (10 MG TOTAL) BY MOUTH DAILY. FOR BLADDER SYMPTOMS    Take 1 tablet (10 mg total) by mouth daily. For bladder symptoms   WARFARIN (COUMADIN) 5 MG TABLET warfarin (COUMADIN) 5 MG tablet      TAKE AS DIRECTED BY COUMADIN CLINIC    TAKE AS DIRECTED BY COUMADIN CLINIC  Discontinued Medications   CLOTRIMAZOLE (LOTRIMIN) 1 % CREAM    Apply topically 2 (two) times daily.

## 2012-10-27 ENCOUNTER — Other Ambulatory Visit: Payer: Self-pay | Admitting: Pulmonary Disease

## 2012-10-29 ENCOUNTER — Other Ambulatory Visit: Payer: Self-pay | Admitting: Pulmonary Disease

## 2012-11-07 ENCOUNTER — Ambulatory Visit (INDEPENDENT_AMBULATORY_CARE_PROVIDER_SITE_OTHER): Payer: Medicare Other | Admitting: *Deleted

## 2012-11-07 DIAGNOSIS — I4891 Unspecified atrial fibrillation: Secondary | ICD-10-CM

## 2012-11-07 DIAGNOSIS — Z7901 Long term (current) use of anticoagulants: Secondary | ICD-10-CM

## 2012-11-07 LAB — POCT INR: INR: 1.8

## 2012-11-28 ENCOUNTER — Ambulatory Visit (INDEPENDENT_AMBULATORY_CARE_PROVIDER_SITE_OTHER): Payer: Medicare Other | Admitting: *Deleted

## 2012-11-28 DIAGNOSIS — Z7901 Long term (current) use of anticoagulants: Secondary | ICD-10-CM

## 2012-11-28 DIAGNOSIS — I4891 Unspecified atrial fibrillation: Secondary | ICD-10-CM

## 2012-11-28 LAB — POCT INR: INR: 1.9

## 2012-12-11 ENCOUNTER — Ambulatory Visit (INDEPENDENT_AMBULATORY_CARE_PROVIDER_SITE_OTHER): Payer: Medicare Other | Admitting: *Deleted

## 2012-12-11 DIAGNOSIS — I4891 Unspecified atrial fibrillation: Secondary | ICD-10-CM

## 2012-12-11 DIAGNOSIS — Z7901 Long term (current) use of anticoagulants: Secondary | ICD-10-CM

## 2012-12-11 LAB — POCT INR: INR: 2.4

## 2012-12-17 ENCOUNTER — Other Ambulatory Visit: Payer: Self-pay | Admitting: Internal Medicine

## 2012-12-18 ENCOUNTER — Other Ambulatory Visit: Payer: Self-pay | Admitting: Pulmonary Disease

## 2012-12-24 ENCOUNTER — Telehealth: Payer: Self-pay | Admitting: Pulmonary Disease

## 2012-12-24 MED ORDER — HYDROCODONE-HOMATROPINE 5-1.5 MG/5ML PO SYRP: ORAL_SOLUTION | ORAL | Status: DC

## 2012-12-24 NOTE — Telephone Encounter (Signed)
Hycodan last refilled 04/28/12 #120 x 2 refills Last OV 10/20/12 Pending 04/28/13 Please advise SN thanks

## 2012-12-24 NOTE — Telephone Encounter (Signed)
Refill has been called to the pharmacy for the pt.  

## 2013-01-02 ENCOUNTER — Ambulatory Visit (INDEPENDENT_AMBULATORY_CARE_PROVIDER_SITE_OTHER): Payer: Medicare Other | Admitting: *Deleted

## 2013-01-02 DIAGNOSIS — I4891 Unspecified atrial fibrillation: Secondary | ICD-10-CM

## 2013-01-02 DIAGNOSIS — Z7901 Long term (current) use of anticoagulants: Secondary | ICD-10-CM

## 2013-01-02 LAB — POCT INR: INR: 2.6

## 2013-01-30 ENCOUNTER — Ambulatory Visit (INDEPENDENT_AMBULATORY_CARE_PROVIDER_SITE_OTHER): Payer: Medicare Other | Admitting: *Deleted

## 2013-01-30 DIAGNOSIS — I4891 Unspecified atrial fibrillation: Secondary | ICD-10-CM

## 2013-01-30 DIAGNOSIS — Z7901 Long term (current) use of anticoagulants: Secondary | ICD-10-CM

## 2013-01-30 LAB — POCT INR: INR: 2.4

## 2013-02-13 ENCOUNTER — Other Ambulatory Visit: Payer: Self-pay | Admitting: Pulmonary Disease

## 2013-02-17 ENCOUNTER — Other Ambulatory Visit: Payer: Self-pay | Admitting: Pulmonary Disease

## 2013-02-27 ENCOUNTER — Ambulatory Visit (INDEPENDENT_AMBULATORY_CARE_PROVIDER_SITE_OTHER): Payer: Medicare Other | Admitting: *Deleted

## 2013-02-27 DIAGNOSIS — I4891 Unspecified atrial fibrillation: Secondary | ICD-10-CM

## 2013-02-27 DIAGNOSIS — Z7901 Long term (current) use of anticoagulants: Secondary | ICD-10-CM

## 2013-03-12 ENCOUNTER — Other Ambulatory Visit: Payer: Self-pay | Admitting: Pulmonary Disease

## 2013-04-06 ENCOUNTER — Telehealth: Payer: Self-pay | Admitting: Pulmonary Disease

## 2013-04-06 MED ORDER — ACETAZOLAMIDE 250 MG PO TABS: ORAL_TABLET | ORAL | Status: DC

## 2013-04-06 NOTE — Telephone Encounter (Signed)
Rx has been sent in. Pt is aware. 

## 2013-04-08 ENCOUNTER — Telehealth: Payer: Self-pay | Admitting: Pulmonary Disease

## 2013-04-08 ENCOUNTER — Encounter (HOSPITAL_COMMUNITY): Payer: Self-pay | Admitting: Emergency Medicine

## 2013-04-08 ENCOUNTER — Other Ambulatory Visit: Payer: Self-pay | Admitting: Pulmonary Disease

## 2013-04-08 ENCOUNTER — Emergency Department (HOSPITAL_COMMUNITY)
Admission: EM | Admit: 2013-04-08 | Discharge: 2013-04-08 | Disposition: A | Discharge status: Home / Self Care | Payer: Medicare Other | Attending: Emergency Medicine | Admitting: Emergency Medicine

## 2013-04-08 DIAGNOSIS — I10 Essential (primary) hypertension: Secondary | ICD-10-CM | POA: Insufficient documentation

## 2013-04-08 DIAGNOSIS — Z7982 Long term (current) use of aspirin: Secondary | ICD-10-CM | POA: Insufficient documentation

## 2013-04-08 DIAGNOSIS — F411 Generalized anxiety disorder: Secondary | ICD-10-CM | POA: Insufficient documentation

## 2013-04-08 DIAGNOSIS — Z7901 Long term (current) use of anticoagulants: Secondary | ICD-10-CM | POA: Insufficient documentation

## 2013-04-08 DIAGNOSIS — Z860100 Personal history of colon polyps, unspecified: Secondary | ICD-10-CM | POA: Insufficient documentation

## 2013-04-08 DIAGNOSIS — I4891 Unspecified atrial fibrillation: Secondary | ICD-10-CM | POA: Insufficient documentation

## 2013-04-08 DIAGNOSIS — Z792 Long term (current) use of antibiotics: Secondary | ICD-10-CM | POA: Insufficient documentation

## 2013-04-08 DIAGNOSIS — Z853 Personal history of malignant neoplasm of breast: Secondary | ICD-10-CM | POA: Insufficient documentation

## 2013-04-08 DIAGNOSIS — L0291 Cutaneous abscess, unspecified: Secondary | ICD-10-CM

## 2013-04-08 DIAGNOSIS — Z79899 Other long term (current) drug therapy: Secondary | ICD-10-CM | POA: Insufficient documentation

## 2013-04-08 DIAGNOSIS — Z8719 Personal history of other diseases of the digestive system: Secondary | ICD-10-CM | POA: Insufficient documentation

## 2013-04-08 DIAGNOSIS — M1990 Unspecified osteoarthritis, unspecified site: Secondary | ICD-10-CM | POA: Insufficient documentation

## 2013-04-08 DIAGNOSIS — Z862 Personal history of diseases of the blood and blood-forming organs and certain disorders involving the immune mechanism: Secondary | ICD-10-CM | POA: Insufficient documentation

## 2013-04-08 DIAGNOSIS — Z8639 Personal history of other endocrine, nutritional and metabolic disease: Secondary | ICD-10-CM | POA: Insufficient documentation

## 2013-04-08 DIAGNOSIS — L02219 Cutaneous abscess of trunk, unspecified: Secondary | ICD-10-CM | POA: Insufficient documentation

## 2013-04-08 MED ORDER — DOXYCYCLINE HYCLATE 100 MG PO TABS: 100.0000 mg | ORAL_TABLET | Freq: Two times a day (BID) | ORAL | Status: DC

## 2013-04-08 MED ORDER — LIDOCAINE-EPINEPHRINE 2 %-1:100000 IJ SOLN
20.0000 mL | Freq: Once | INTRAMUSCULAR | Status: AC
  Administered 2013-04-08: 20 mL via INTRADERMAL
  Filled 2013-04-08: qty 1

## 2013-04-08 NOTE — ED Provider Notes (Signed)
    Celene Kras, MD 04/08/13 [phone]

## 2013-04-08 NOTE — ED Provider Notes (Signed)
CSN: [id number]     Arrival date & time 04/08/13  1235 History  First MD Initiated Contact with Patient 04/08/13 1314     Chief Complaint  Patient presents with  . Recurrent Skin Infections    HPI Pt noticed a sore on her chest two weeks ago.  She has not been in to see anyone but her daughter came back in town today so she told her about it.  She has noticed some drainage from the area.  It is tender to the touch and increases with palpation.  No fever.  No shortness of breath.  She denies any other symptoms. The patient does have a history of bilateral mastectomies from breast cancer. Past Medical History  Diagnosis Date  . Allergic rhinitis   . Hypoxemia   . HTN (hypertension)   . Atrial fibrillation   . Sick sinus syndrome with tachycardia   . Venous insufficiency   . Borderline diabetes mellitus   . IBS (irritable bowel syndrome)   . Tubulovillous adenoma of colon 2006  . Adenocarcinoma, breast     bilateral  . DJD (degenerative joint disease)   . Headache(784.0)   . Anxiety   . Diverticulosis    Past Surgical History  Procedure Laterality Date  . Double mastectomy    . Knee surgery  2004    RIGHT  . Cataract extraction    . Cholecystectomy    . Mastectomy  1996    right breast for breast cancer  . Mastectomy  1/02    left - Dr Ezzard Standing  . Total knee arthroplasty  6/04    right - Dr Priscille Kluver   Family History  Problem Relation Age of Onset  . Kidney cancer Sister   . Heart attack Mother   . Ulcers Father   . Heart disease Brother     CABG  . Asthma Brother   . Breast cancer Daughter    History  Substance Use Topics  . Smoking status: Never Smoker   . Smokeless tobacco: Never Used  . Alcohol Use: No   OB History   Grav Para Term Preterm Abortions TAB SAB Ect Mult Living                 Review of Systems  All other systems reviewed and are negative.    Allergies  Iohexol  Home Medications   Current Outpatient Rx  Name  Route  Sig  Dispense   Refill  . acetaZOLAMIDE (DIAMOX) 250 MG tablet      Take 2 tablets daily at 4pm         . albuterol (PROVENTIL HFA;VENTOLIN HFA) 108 (90 BASE) MCG/ACT inhaler   Inhalation   Inhale 1-2 puffs into the lungs every 6 (six) hours as needed for wheezing or shortness of breath.         . allopurinol (ZYLOPRIM) 300 MG tablet   Oral   Take 300 mg by mouth daily.         Marland Kitchen aspirin 81 MG tablet   Oral   Take 81 mg by mouth daily.           . Cholecalciferol (VITAMIN D) 2000 UNITS CAPS   Oral   Take 1 capsule by mouth daily.         . furosemide (LASIX) 40 MG tablet   Oral   Take 40 mg by mouth daily.         Marland Kitchen HYDROcodone-homatropine (HYCODAN) 5-1.5 MG/5ML syrup  TAKE 1 TEASPOON EVERY 4 HOURS AS NEEDED FOR COUGH         . loperamide (IMODIUM) 2 MG capsule   Oral   Take 2 mg by mouth as needed for diarrhea or loose stools.          . Multiple Vitamin (MULTIVITAMIN) capsule   Oral   Take 1 capsule by mouth daily.           Marland Kitchen oxybutynin (DITROPAN-XL) 10 MG 24 hr tablet   Oral   Take 10 mg by mouth daily.         . potassium chloride SA (K-DUR,KLOR-CON) 20 MEQ tablet   Oral   Take 20 mEq by mouth 2 (two) times daily.         . propranolol (INDERAL) 80 MG tablet   Oral   Take 40 mg by mouth 2 (two) times daily.         . quinapril (ACCUPRIL) 40 MG tablet   Oral   Take 1 tablet (40 mg total) by mouth daily.   30 tablet   11   . traMADol (ULTRAM) 50 MG tablet   Oral   Take 1 tablet (50 mg total) by mouth 3 (three) times daily as needed for pain.   50 tablet   5   . warfarin (COUMADIN) 5 MG tablet   Oral   Take 5 mg by mouth daily. Take 7.5 mg on Tuesday, Thursday and Saturday.   Take 5 mg on Monday, Wednesday, Friday, and Sunday.         Marland Kitchen doxycycline (VIBRA-TABS) 100 MG tablet   Oral   Take 1 tablet (100 mg total) by mouth 2 (two) times daily.   14 tablet   0    BP 153/85  Pulse 80  Temp(Src) 98.4 F (36.9 C) (Oral)  Resp 16   Ht 5\' 3"  (1.6 m)  Wt 135 lb (61.236 kg)  BMI 23.92 kg/m2  SpO2 100% Physical Exam  Nursing note and vitals reviewed. Constitutional: She appears well-developed and well-nourished. No distress.  HENT:  Head: Normocephalic and atraumatic.  Right Ear: External ear normal.  Left Ear: External ear normal.  Eyes: Conjunctivae are normal. Right eye exhibits no discharge. Left eye exhibits no discharge. No scleral icterus.  Neck: Neck supple. No tracheal deviation present.  Cardiovascular: Normal rate, regular rhythm and intact distal pulses.   Pulmonary/Chest: Effort normal and breath sounds normal. No stridor. No respiratory distress. She has no wheezes. She has no rales. She exhibits tenderness.  Partially 2 cm indurated raised lesion on the anterior aspect of the left chest, tender to palpation, some surrounding erythema, fluctuance with an eschar  Abdominal: Soft. Bowel sounds are normal. She exhibits no distension. There is no tenderness. There is no rebound and no guarding.  Musculoskeletal: She exhibits no edema and no tenderness.  Neurological: She is alert. She has normal strength. No sensory deficit. Cranial nerve deficit:  no gross defecits noted. She exhibits normal muscle tone. She displays no seizure activity. Coordination normal.  Skin: Skin is warm and dry. No rash noted.  Psychiatric: She has a normal mood and affect.    ED Course  Procedures (including critical care time) Labs Review Labs Reviewed  WOUND CULTURE   Imaging Review No results found.  EKG Interpretation   None      I&D procedure performed by PA Laveda Norman under my supervision.  Please see his I&D note  MDM   1. Skin abscess  I discussed treatment with the patient and her daughter. I think we should perform an incision and drainage. I will send off a wound culture. I will start the patient on antibiotics. I am concerned however about the location of this lesion in her history of prior breast cancer. I  explained to the patient the importance of following up with her primary Dr. to make sure this lesion resolves. If not a biopsy may be indicated.     Celene Kras, MD 04/08/13 [phone]

## 2013-04-08 NOTE — Telephone Encounter (Signed)
Per SN pt needs to go to ER to be evaluated. [id number]. Carron Curie, CMA

## 2013-04-08 NOTE — Telephone Encounter (Signed)
I spoke with pt daughter and is aware. Nothing further needed

## 2013-04-08 NOTE — ED Notes (Signed)
Pt states the past 2 weeks has had a "bump" on her chest, bump is center chest/black in color and draining bloody drainage. Pt states tender to touch.

## 2013-04-08 NOTE — ED Provider Notes (Signed)
Abscess I&D performed by me at the request of Dr. Lynelle Doctor  INCISION AND DRAINAGE Performed by: Fayrene Helper Consent: Verbal consent obtained. Risks and benefits: risks, benefits and alternatives were discussed Type: abscess  Body area: mid anterior chest wall  Anesthesia: local infiltration  Incision was made with a scalpel.  Local anesthetic: lidocaine 2% w epinephrine  Anesthetic total: 3 ml  Complexity: complex Blunt dissection to break up loculations  Drainage: purulent  Drainage amount: moderate  Packing material: 1/4 in iodoform gauze  Patient tolerance: Patient tolerated the procedure well with no immediate complications.     Fayrene Helper, PA-C 04/08/13 1407

## 2013-04-10 ENCOUNTER — Ambulatory Visit (INDEPENDENT_AMBULATORY_CARE_PROVIDER_SITE_OTHER): Payer: Medicare Other | Admitting: *Deleted

## 2013-04-10 DIAGNOSIS — Z7901 Long term (current) use of anticoagulants: Secondary | ICD-10-CM

## 2013-04-10 DIAGNOSIS — I4891 Unspecified atrial fibrillation: Secondary | ICD-10-CM

## 2013-04-10 LAB — POCT INR: INR: 3.3

## 2013-04-11 LAB — WOUND CULTURE: Special Requests: NORMAL

## 2013-04-17 ENCOUNTER — Other Ambulatory Visit: Payer: Self-pay | Admitting: Pulmonary Disease

## 2013-04-24 ENCOUNTER — Ambulatory Visit (INDEPENDENT_AMBULATORY_CARE_PROVIDER_SITE_OTHER): Payer: Medicare Other | Admitting: *Deleted

## 2013-04-24 DIAGNOSIS — I4891 Unspecified atrial fibrillation: Secondary | ICD-10-CM

## 2013-04-24 DIAGNOSIS — Z7901 Long term (current) use of anticoagulants: Secondary | ICD-10-CM

## 2013-04-28 ENCOUNTER — Encounter: Payer: Self-pay | Admitting: Pulmonary Disease

## 2013-04-28 ENCOUNTER — Ambulatory Visit (INDEPENDENT_AMBULATORY_CARE_PROVIDER_SITE_OTHER): Payer: Medicare Other | Admitting: Pulmonary Disease

## 2013-04-28 VITALS — BP 142/80 | HR 68 | Temp 98.0°F | Ht 63.0 in | Wt 142.8 lb

## 2013-04-28 DIAGNOSIS — M1990 Unspecified osteoarthritis, unspecified site: Secondary | ICD-10-CM

## 2013-04-28 DIAGNOSIS — I4891 Unspecified atrial fibrillation: Secondary | ICD-10-CM

## 2013-04-28 DIAGNOSIS — F411 Generalized anxiety disorder: Secondary | ICD-10-CM

## 2013-04-28 DIAGNOSIS — K589 Irritable bowel syndrome without diarrhea: Secondary | ICD-10-CM

## 2013-04-28 DIAGNOSIS — R3989 Other symptoms and signs involving the genitourinary system: Secondary | ICD-10-CM

## 2013-04-28 DIAGNOSIS — I272 Pulmonary hypertension, unspecified: Secondary | ICD-10-CM

## 2013-04-28 DIAGNOSIS — R7309 Other abnormal glucose: Secondary | ICD-10-CM

## 2013-04-28 DIAGNOSIS — R32 Unspecified urinary incontinence: Secondary | ICD-10-CM | POA: Insufficient documentation

## 2013-04-28 DIAGNOSIS — I2789 Other specified pulmonary heart diseases: Secondary | ICD-10-CM

## 2013-04-28 DIAGNOSIS — K5730 Diverticulosis of large intestine without perforation or abscess without bleeding: Secondary | ICD-10-CM

## 2013-04-28 DIAGNOSIS — Z853 Personal history of malignant neoplasm of breast: Secondary | ICD-10-CM

## 2013-04-28 DIAGNOSIS — Z23 Encounter for immunization: Secondary | ICD-10-CM

## 2013-04-28 DIAGNOSIS — I872 Venous insufficiency (chronic) (peripheral): Secondary | ICD-10-CM

## 2013-04-28 DIAGNOSIS — I10 Essential (primary) hypertension: Secondary | ICD-10-CM

## 2013-04-28 DIAGNOSIS — L089 Local infection of the skin and subcutaneous tissue, unspecified: Secondary | ICD-10-CM

## 2013-04-28 DIAGNOSIS — D126 Benign neoplasm of colon, unspecified: Secondary | ICD-10-CM

## 2013-04-28 NOTE — Progress Notes (Signed)
Subjective:    Patient ID: Cheyenne [name], female    DOB: Mar 20, 1926, 79 y.o.   MRN: [id number]  HPI 79 y/o BF here for a follow up visit... she has multiple medical problems as noted below>>>  ~  Oct 23, 2011:  42mo ROV & Cheyenne [name] is c/o no energy, otherw stable w/o other symptomatology; we reviewed prob list, meds, xrays & labs>  See below>> LABS 5/13:  Chems- ok w/ HCO3=39 on Lasix+Diamox;  CBC- ok w/ Hg=11.9;  TSH=1.96;  BNP=534... 2DEcho 5/13 showed normal LVF w/ EF=55%, mild AI & MR, mild LA & RA dilatation, PAsys=49...  ~  April 22, 2012:  42mo ROV & Cheyenne [name] has had a stable interval- no new complaints or concerns;  We reviewed the following problems today:    Hypoxemia, PulmHTN> on HomeO2 & Hycodan cough syrup; she feels her breathing is at baseline; last 2DEcho w/ PAsys~50 & stable...    HBP> on ASA81, Propran80Bid, Quinapril40, Lasix40, Diamox250-2/d, & K20; BP= 128/82 & she denies CP, palpit, ch in SOB or edema...    AFib, SSS, Tachy-brady> on Coumadin (via [facility]); followed by DrKlein for Cards/EP...    DM> on diet alone; she is down 8# to 150# today; BS is normal at 97...    GI- Divertics, IBS, Polyps> they received a letter from DrStark 7 daugh wants her to set up appt to see him...    GU- bladder symptoms> sounds like urge/stress incont- trial Oxybutynin10mg  7 refer to Urology...    Hx bilat breast cancers> s/p mastectomies in 1996 & 2002, followed by [name] & doing well w/o known recurrence...    DJD> on Allopurinol 300, Tramadol50; stable w/ mod DJD & limited mobility (knees, hips)...    Early dementia, anxiety> Aware- family controls her meds 7 helps at home... We reviewed prob list, meds, xrays and labs> see below for updates >>  LABS 11/13:  Chems- ok w/ K=3.7 Co2=34 BUN=23 Creat=0.9;  BNP=470...   ~  Oct 20, 2012:  42mo ROV & Cheyenne [name] has been stable- had some recent dizziness, otherw ok;  We reviewed the following medical problems during today's office visit >>   Hypoxemia, PulmHTN> on HomeO2 & Hycodan cough syrup; she feels her breathing is at baseline; last 2DEcho w/ PAsys~50 & stable...    HBP> on ASA81, Propran80Bid, Quinapril40, Lasix40, Diamox250-2/d, & K20Bid; BP= 136/80 & she denies CP, palpit, ch in SOB or edema...    AFib, SSS, Tachy-brady> on Coumadin (via CC); followed by DrKlein for Cards/EP...    DM> on diet alone; she is down 6# to 144# today; BS is normal at 102...    GI- Divertics, IBS, Polyps> on Immodium prn; she had appt w/ DrStark 12/13> IBS, hx divertics & colon polyps, he did not rec surveillance colonoscopy for her...    GU- bladder symptoms> sounds like urge/stress incont- trial Oxybutynin10mg  & eval by Urology DrManny> rec changing diuretics if poss but it is not poss...    Hx bilat breast cancers> s/p mastectomies in 1996 & 2002, followed by Cheyenne [name] & doing well w/o known recurrence...    DJD> on Allopurinol 300, Tramadol50; stable w/ mod DJD & limited mobility (knees, hips)...    Early dementia, anxiety> Aware- family controls her meds & helps at home... We reviewed prob list, meds, xrays and labs> see below for updates >>  CXR 5/14 showed cardiomeg, clear lungs, DJD spine, NAD... LABS 5/14:  Chems- ok x K=3.4;  CBC- ok w/ Hg=12.3;  TSH=1.38;  VitD=31;  BNP=463...  ~  April 28, 2013:  25mo ROV & Cheyenne [name] developed a skin lesion on her ant lower chest wall over the xyphoid & went to the ER 10/29- ?infected cyst ?other etiology; they did an I&D (note says it drained purulent material) & packed w/ iodoform gauze; placed her on DoxyBid & told to f/u here; C&S grew mult organisms no staph aureus recovered;  Today we removed the iodoform gauze & cleaned the wound, no pus or drainage, can still palp a subcut knot/lesion & there is a mod-arge defect=> rec f/u by [facility] (she has seen Cheyenne [name] in the past for her breast cancer surg) as this may require excision... We reviewed the following medical problems during today's office visit >>      Hypoxemia, PulmHTN> on HomeO2, ProairHFA prn, & Hycodan cough syrup; she feels her breathing is at baseline; last 2DEcho w/ PAsys~50 & stable...    HBP> on ASA81, Propran80-1/2Bid, Quinapril40, Lasix40, Diamox250-2/d, & K20Bid; BP= 142/80 & she denies CP, palpit, ch in SOB or edema...    AFib, SSS, Tachy-brady> on Coumadin (via CC); followed by DrKlein for Cards/EP...    DM> on diet alone; wt is stable at 143# today; BS has been normal ~100...    GI- Divertics, IBS, Polyps> on Immodium prn; she had appt w/ DrStark 12/13> IBS, hx divertics & colon polyps, he did not rec surveillance colonoscopy for her...    GU- bladder symptoms> she has freq & urge/stress incont- trial Oxybutynin10mg  & eval by Urology DrManny> rec changing diuretics if poss but it is not poss...    Hx bilat breast cancers> s/p mastectomies in 1996 & 2002, followed by Cheyenne [name] & doing well w/o known recurrence...    DJD> on Allopurinol 300, Tramadol50; stable w/ mod DJD & limited mobility (knees, hips)...    Early dementia, anxiety> Aware- family controls her meds & helps at home... We reviewed prob list, meds, xrays and labs> see below for updates >> ok Flu vaccine today...           Problem List:  ALLERGIC RHINITIS (ICD-477.9) - on FLONASE Qhs & ZYRTEK QAM Prn.  Hx of HYPOXEMIA (ICD-799.02) - full eval for hypoxemia in [id number]-  ~  7/08:  CXR= cardiomegaly, ectatic Ao, pulm ven HTN... CTAngio= neg, without PE, min scarring in lingula... 2DEcho= norm LVF w/ EF=55%, LVwall thickness at upper limits, mild ca++ AoV w/ mild AI,  PA sys pressure = ... O2 sats 90-94 at rest on RA... w/ ambulation in the office sats drop to 85%... she didn't yet want to go on oxygen at home... just occas SOB w/ activity- she is too sedentary and needs to incr her exercise program. ~  CTChest 6/09 showed bilat mastectomies, no adenopathy, no fluid, 2 sm nodules 4-15mm size RML/LLL, NAD... ~  6/10: c/o increased DOE & no energy- O2 sat= 91% rest &  86% w/activ... rec> home O2 1L/min rest, 2L/min exercise. ~  4/11 hosp> V/Q scan w/ normal vent & norm perfusion... CXR 4/11 showed cardiomeg, clear lungs, no CHF, surg clips in right axilla... ~  8/11:  pt requesting change to LiqO2 to incr mobility. ~  11/12:  O2 sat= 94% at rest on 2L/min oxygen via [place]... remains stable. ~  11/13:  O2 sat= 100% on 2L/min at rest... ~  CXR 5/14 showed cardiomeg, clear lungs, DJD spine, NAD ~  11/14:  O2 recert today> O2 sat on RA at rest=96% w/ HR=65/min;  Ambulated 3 laps on RA w/  nadir O2 sat=88% w/ HR=116; Rec to continue O2 w/ exercise & Qhs...  HYPERTENSION (ICD-401.9) - controlled on PROPRANOLOL 80mg Bid, QUINAPRIL 40mg /d; and back on LASIX 40mg AM, KCL , & ACETAZOLAMIDE 250mg - 2tabsPM... she knows to avoid sodium etc... ~  labs 2/09 showed Na143, K3.6, CL101, CO2 36, BUN 15, Cr0.9.Marland KitchenMarland Kitchen BNP was 194... ~  labs 6/09 showed lytes normal x TCO2= 37, & BNP= 512... ~  labs 12/09 showed HCO3- 36,  BNP= 586... rec- same meds, no salt! ~  labs 6/10 showed Na= 146, K= 3.6, CO2= 36, BUN= 14, Creat= 0.9, BNP= 336. ~  labs 9/10 showed Na-147, K=3.7, HCO3=36, BUN=18, Creat=0.9, BNP=484 ~  labs 1/11 showed Na=144, K=3.9, HCO3=35, BUN=17, Creat=0.9, BNP=488 ~  [facility] 4/11 & meds changed by TH> off diuretics & KCl... CXR showed cardiomeg, clear lungs, clips right axilla. ~  labs 10/15/09 showed Na=143, K=4.0, HCO3=37, BUN=12, Creat=0.8, BNP=761... restart Lasix, Diamox, KCl (one each). ~  labs 10/25/09 showed HCO3=44, BNP=588... rec> incr Diamox to 2Qpm... ~  labs 8/11 showed HCO3=39, BNP=481... continue same. ~  Labs 5/12 showed HCO3=35, BNP=373... Improved, continue same Rx. ~  Labs 11/12 showed HCO3=37, BNP=351... Stable continue same meds. ~  5/13: BP= 138/88 & HCO3=39, BUN=17, Creat=0.9, BNP=534... Reminded to take meds daily. ~  11/3: on ASA81, Propran80Bid, Quinapril40, Lasix40, Diamox250-2/d, & K20; BP= 128/82 & she denies CP, palpit, ch in SOB or edema...  ~   5/14: on ASA81, Propran80Bid, Quinapril40, Lasix40, Diamox250-2/d, & K20Bid; BP= 136/80 & she denies CP, palpit, ch in SOB or edema ~  11/14: on ASA81, Propran80-1/2Bid, Quinapril40, Lasix40, Diamox250-2/d, & K20Bid; BP= 142/80 & she remains essentially asymptomatic...  ATRIAL FIBRILLATION (ICD-427.31) & SICK SINUS/ TACHY-BRADY SYNDROME (ICD-427.81) - new prob 1/10 w/ eval by DrKlein- rate control strategy on Propranolol w/ [facility]. ~  7/08:  2DEcho= norm LVF w/ EF=55%, LVwall thickness at upper limits, mild ca++ AoV w/ mild AI,  PA sys pressure = ... ~  EKG 1/10 showed AFib, rate~ 60, NSSTTWA... ~  2DEcho 3/10 showed norm LVF w/ EF= 60% & no regional wall motion abn, mild calcif AoV w/ mild AI, mild LA & RA dil w/ incr thickness of interatrial septum c/w lipomatous hypertrophy, PA sys est at 50... ~  Holter monitor 3/10 = AFib w/ rate 45-97... ~  2DEcho 4/11 in hosp showed norm LVwall thickness & EF=55%, mild RVdil w/ mod decr RV function, mod TR & PAsys~69... ~  2DEcho 5/13 showed normal LVF w/ EF=55%, mild AI & MR, mild LA & RA dilatation, PAsys=49... Stable. ~  Stable on Coumadin & followed by DrKlein...  PULMONARY HYPERTENSION >> Serial 2DEchos showed progressive incr PA sys estimates... But the most recent 2DEcho 5/13 was stable (see above). ~  V/Q lung scan 4/11 was neg for PE...  VENOUS INSUFFICIENCY (ICD-459.81) - mild persist edema despite the sodium restriction, Lasix and Diamox... reviewed recommendation for No Salt, Elevation, TED's, etc...  DIABETES MELLITUS, BORDERLINE (ICD-790.29) - prev on Metformin 500mg /d (held after 4/11 hosp)... ~  last BS=181, HgA1c=6.4 in 11/08... ~  labs 6/09 showed BS= 101, HgA1c= 6.4 ~  labs 12/09 showed BS= 139,  HgA1c= 6.1 ~  labs 6/10 showed BS= 123, A1c= 6.1 ~  7/10: neg ophthal f/u DrHecker- no retinopathy, no macular edema. ~  labs 9/10 showed BS= 93 ~  labs 1/11 showed BS=146, A1c=5.7 ~  labs 5/11 showed BS= 100  on diet alone... ~  labs 8/11 showed BS= 96 ~  Labs 5/12 showed BS= 113, A1c= 5.8 ~  9/12:  Ophthalmology check by DrHecker- no DM retinopathy... ~  Labs 11/12 showed BS= 104 ~  Labs 5/13 showed BS= 118 ~  Labs 11/13 showed BS= 97 ~  2/14: she had a neg Ophthalmology eval by DrHecker... ~  Labs 5/14 showed BS= 102 on diet alone...  DIVERTICULOSIS OF COLON, IRRITABLE BOWEL SYNDROME & COLONIC POLYPS (ICD-211.3) ~  colonoscopy was 11/06 by DrStark showing divertics and several polyps (one 20mm adenoma)... f/u 23yrs. ~  colonoscopy 1/10 by DrStark showed divertics (severe in sigmoid), 4mm polyp= tub adenoma... f/u 3 yrs. ~  12/13:  She had GI f/u DrStark> IBS w/ freq loose stools, occas BRB, hx extensive divertics & adenomatous polyps, on coumadin for AFib;   URINARY FREQ&URGENCY >>  ~  11/13: she had a GU eval by DrManny> c/o freq & urgency assoc w/ Lasix, some leakage & managing w/ one pad/d, some better w/ Oxybutynin... ~  5/14: she had f/u DrManny, Urology> urinary freq & urgency; worse w/ Lasix; mild leakage & uses 1 pad/d; Oxybutynin caused dry mouth; situation is acceptable & no further meds rec...  BILAT BREAST CANCERS - resected via mastectomies in 1996 and 2002... followed by [facility] annually & seen 8/12> note reviewed.  DEGENERATIVE JOINT DISEASE (ICD-715.90) - w/ hx hyperuricemia on ALLOPURINOL 300mg /d & has TRAMADOL 50mg  prn pain...  Hx of HEADACHE (ICD-784.0) ~  MRI Brain 4/11 showed atrophy & sm vessel dis, degen changes at C1-2 w/ some sp stenosis caused part by ant slip of the C1 ring, incidental part empty sella...  ANXIETY (ICD-300.00) - prev rec to take Alpraz 0,5mg  Prn but she never filled the Rx. ~  Remeron 7.5mg /d started 4/11 hosp & stopped 5/11 due to lethargy, sleepiness...   Past Surgical History  Procedure Laterality Date  . Double mastectomy    . Knee surgery  2004    RIGHT  . Cataract extraction    . Cholecystectomy    . Mastectomy  1996    right breast  for breast cancer  . Mastectomy  1/02    left - Dr Ezzard Standing  . Total knee arthroplasty  6/04    right - Dr Priscille Kluver    Outpatient Encounter Prescriptions as of 04/28/2013  Medication Sig  . acetaZOLAMIDE (DIAMOX) 250 MG tablet TAKE 2 TABLETS DAILY AT 4PM  . albuterol (PROVENTIL HFA;VENTOLIN HFA) 108 (90 BASE) MCG/ACT inhaler Inhale 1-2 puffs into the lungs every 6 (six) hours as needed for wheezing or shortness of breath.  . allopurinol (ZYLOPRIM) 300 MG tablet Take 300 mg by mouth daily.  Marland Kitchen aspirin 81 MG tablet Take 81 mg by mouth daily.    . Cholecalciferol (VITAMIN D) 2000 UNITS CAPS Take 1 capsule by mouth daily.  . furosemide (LASIX) 40 MG tablet Take 40 mg by mouth daily.  Marland Kitchen HYDROcodone-homatropine (HYCODAN) 5-1.5 MG/5ML syrup TAKE 1 TEASPOON EVERY 4 HOURS AS NEEDED FOR COUGH  . loperamide (IMODIUM) 2 MG capsule Take 2 mg by mouth as needed for diarrhea or loose stools.   . Multiple Vitamin (MULTIVITAMIN) capsule Take 1 capsule by mouth daily.    Marland Kitchen oxybutynin (DITROPAN-XL) 10 MG 24 hr tablet Take 10 mg by mouth daily.  . potassium chloride SA (K-DUR,KLOR-CON) 20 MEQ tablet Take 20 mEq by mouth 2 (two) times daily.  . propranolol (INDERAL) 80 MG tablet Take 40 mg by mouth 2 (two) times daily.  . quinapril (ACCUPRIL) 40 MG tablet Take 1 tablet (  40 mg total) by mouth daily.  . traMADol (ULTRAM) 50 MG tablet Take 1 tablet (50 mg total) by mouth 3 (three) times daily as needed for pain.  Marland Kitchen warfarin (COUMADIN) 5 MG tablet Take 5 mg by mouth daily. Take 7.5 mg on Tuesday, Thursday and Saturday.   Take 5 mg on Monday, Wednesday, Friday, and Sunday.  . [DISCONTINUED] doxycycline (VIBRA-TABS) 100 MG tablet Take 1 tablet (100 mg total) by mouth 2 (two) times daily.    Allergies  Allergen Reactions  . Iohexol      Desc: IV History sheet from prior CT states IV Contrast allergy- 13 hour prep given.     Current Medications, Allergies, Past Medical History, Past Surgical History, Family  History, and Social History were reviewed in [employer] record.    Review of Systems         See HPI - all other systems neg except as noted...  The patient complains of dyspnea on exertion and peripheral edema.  The patient denies anorexia, fever, weight loss, weight gain, vision loss, decreased hearing, hoarseness, chest pain, syncope, prolonged cough, headaches, hemoptysis, abdominal pain, melena, hematochezia, severe indigestion/heartburn, hematuria, incontinence, muscle weakness, suspicious skin lesions, transient blindness, difficulty walking, depression, unusual weight change, abnormal bleeding, enlarged lymph nodes, and angioedema.     Objective:   Physical Exam      WD, WN, 79 y/o BF in NAD... GENERAL:  Alert & oriented; pleasant & cooperative... HEENT:  [place]/AT, EOM-wnl, PERRLA, EACs-clear, TMs-wnl, NOSE-clear, THROAT-clear & wnl. NECK:  Supple w/ fairROM; no JVD; normal carotid impulses w/o bruits; no thyromegaly or nodules palpated; no lymphadenopathy. CHEST:  Clear to P & A; without wheezes/ rales/ or rhonchi heard... HEART:  irregular rhythm; gr 1/6 SEM without rubs or gallops appreciated... ABDOMEN:  Soft & nontender; normal bowel sounds; no organomegaly or masses detected. EXT: without deformities, mild arthritic changes; no varicose veins/ +venous insuffic/ 1-2+edema. NEURO:  CN's intact; no focal neuro deficits... DERM:  There is a rounded lesion left calf c/w ringworm...  RADIOLOGY DATA:  Reviewed in the EPIC EMR & discussed w/ the patient...  LABORATORY DATA:  Reviewed in the EPIC EMR & discussed w/ the patient...   Assessment & Plan:    SKIN LESION ON CHEST WALL>> I&D in the ER, it may need excision 7 we will refer to [facility], DrNewman...   HYPOXEMIA & CO2 retention>  Stable on her O2 regularly & w/ diuretic regimen including Acetazolamide, continue same... PULM HYPERTENSION ?Etiology>  We rechecked 2DEcho 5/13 & everything looks stable  compared to 41yrs prev, continue same meds for now.  HBP>  Controlled on Propranolol, Quinapril, Lasix, & Diamox; continue same...  AFib>  Stable on rate control strategy, no apparent prob w/ tachy or brady; continue same meds...  DM>  Well maintained on diet Rx...  GI>  Divertics, IBS, Polyps>  Stable, we discussed Simethacone Rx for gas...  GU> trial Oxybutynin10 for bladder symptoms and refer to Urology per daugh request...  Hx bilat breast cancers>  Stable, followed by [name], no known recurrence...  DJD>  Hx hyperuricemia on Allopurinol Rx & no clinical attacks...  Anxiety>  Stable on Prn Alprazolam Rx...   Patient's Medications  New Prescriptions   No medications on file  Previous Medications   ACETAZOLAMIDE (DIAMOX) 250 MG TABLET    TAKE 2 TABLETS DAILY AT 4PM   ALBUTEROL (PROVENTIL HFA;VENTOLIN HFA) 108 (90 BASE) MCG/ACT INHALER    Inhale 1-2 puffs into the lungs  every 6 (six) hours as needed for wheezing or shortness of breath.   ALLOPURINOL (ZYLOPRIM) 300 MG TABLET    Take 300 mg by mouth daily.   ASPIRIN 81 MG TABLET    Take 81 mg by mouth daily.     CHOLECALCIFEROL (VITAMIN D) 2000 UNITS CAPS    Take 1 capsule by mouth daily.   FUROSEMIDE (LASIX) 40 MG TABLET    Take 40 mg by mouth daily.   HYDROCODONE-HOMATROPINE (HYCODAN) 5-1.5 MG/5ML SYRUP    TAKE 1 TEASPOON EVERY 4 HOURS AS NEEDED FOR COUGH   LOPERAMIDE (IMODIUM) 2 MG CAPSULE    Take 2 mg by mouth as needed for diarrhea or loose stools.    MULTIPLE VITAMIN (MULTIVITAMIN) CAPSULE    Take 1 capsule by mouth daily.     OXYBUTYNIN (DITROPAN-XL) 10 MG 24 HR TABLET    Take 10 mg by mouth daily.   PROPRANOLOL (INDERAL) 80 MG TABLET    Take 40 mg by mouth 2 (two) times daily.   TRAMADOL (ULTRAM) 50 MG TABLET    Take 1 tablet (50 mg total) by mouth 3 (three) times daily as needed for pain.   WARFARIN (COUMADIN) 5 MG TABLET    Take 5 mg by mouth daily. Take 7.5 mg on Tuesday, Thursday and Saturday.   Take 5 mg on Monday,  Wednesday, Friday, and Sunday.  Modified Medications   Modified Medication Previous Medication   POTASSIUM CHLORIDE SA (K-DUR,KLOR-CON) 20 MEQ TABLET potassium chloride SA (K-DUR,KLOR-CON) 20 MEQ tablet      TAKE 1 TABLET (20 MEQ TOTAL) BY MOUTH 2 (TWO) TIMES DAILY.    Take 20 mEq by mouth 2 (two) times daily.   QUINAPRIL (ACCUPRIL) 40 MG TABLET quinapril (ACCUPRIL) 40 MG tablet      TAKE 1 TABLET (40 MG TOTAL) BY MOUTH DAILY.    Take 1 tablet (40 mg total) by mouth daily.  Discontinued Medications   DOXYCYCLINE (VIBRA-TABS) 100 MG TABLET    Take 1 tablet (100 mg total) by mouth 2 (two) times daily.

## 2013-04-28 NOTE — Patient Instructions (Signed)
Today we updated your med list in our EPIC system...    Continue your current medications the same...  We removed the packing from the skin lesion on your lower chest wall/ upper abd...    We cleaned the wound & dressed it for you...    Continue your cleaning & dressing changes at home...  We have arranged for a surgical consult w/ your surgeon- Dr. Ovidio Kin for Thursday 04/30/13 at 2:15PM..Marland Kitchen    He can determine if any further surg will be needed for this lesion...  We checked your oxygen saturations while ambulating on RA today>>    Continue the Oxygen at 2L/min w/ exercise and 1L/min at night...  Call for any questions...  Let's plan a follow up visit in 76mo, sooner if needed for problems.Marland KitchenMarland Kitchen

## 2013-04-30 ENCOUNTER — Ambulatory Visit (INDEPENDENT_AMBULATORY_CARE_PROVIDER_SITE_OTHER): Payer: Medicare Other | Admitting: Surgery

## 2013-04-30 ENCOUNTER — Other Ambulatory Visit: Payer: Self-pay | Admitting: Pulmonary Disease

## 2013-04-30 ENCOUNTER — Encounter (INDEPENDENT_AMBULATORY_CARE_PROVIDER_SITE_OTHER): Payer: Self-pay | Admitting: Surgery

## 2013-04-30 VITALS — BP 142/82 | HR 56 | Temp 97.8°F | Resp 14 | Ht 63.0 in | Wt 141.4 lb

## 2013-04-30 DIAGNOSIS — L089 Local infection of the skin and subcutaneous tissue, unspecified: Secondary | ICD-10-CM

## 2013-04-30 NOTE — Progress Notes (Signed)
[facility]  Ovidio Kin, MD,  FACS [address] [place]  [address] [place], [place]    [address] Phone:  [phone] FAX:  [phone]   Re:   Cheyenne [name] DOB:   [birth year] MRN:   [id number]  ASSESSMENT AND PLAN: 1.  Ulcer/healing wound over lower sternum  [photo at end of chart]  This should heal entirely.  I will follow it until it heals.  If it does not heal, would consider biopsy.  I think that this is unrelated to her breast cancers.  2.  Right breast cancer  Right mastectomy by Dr. Kathie Rhodes. [facility] in 1996 3.  Left breast cancer  Left mastectomy by Dr. Algis Downs. Shameca Landen in 2002 4.  Chronic lung disease and pulmonary hypertension  Sees Dr. Jodelle Green 5.  Hypertension 6.  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 7.  Gout  HISTORY OF PRESENT ILLNESS: Chief Complaint  Patient presents with  . Follow-up    eval checst wall abscess/mass/ hx br cancer   Cheyenne [name] is a 79 y.o. (DOB: July 25, 1925)  AA  female who is a patient of NADEL,SCOTT M, MD and comes to me today for sternal infection. The patient is accompanied by her daughter, Lonia Skinner.   About 3 weeks ago, she noticed an infection no her lower sternum.   She went to the [facility] ER on 04/08/2013.  They did an I&D.  Cultures were obtained which remain negative - multiple organisms present.  She was putting Neosporin on the wound.  She saw Dr. Kriste Basque 04/07/2013 who asked [place] to see the patient.  Past Medical History  Diagnosis Date  . Allergic rhinitis   . Hypoxemia   . HTN (hypertension)   . Atrial fibrillation   . Sick sinus syndrome with tachycardia   . Venous insufficiency   . Borderline diabetes mellitus   . IBS (irritable bowel syndrome)   . Tubulovillous adenoma of colon 2006  . Adenocarcinoma, breast     bilateral  . DJD (degenerative joint disease)   . Headache(784.0)   . Anxiety   . Diverticulosis    SOCIAL HISTORY: Daughter, Lonia Skinner, with patient.  PHYSICAL  EXAM: BP 142/82  Pulse 56  Temp(Src) 97.8 F (36.6 C) (Temporal)  Resp 14  Ht 5\' 3"  (1.6 m)  Wt 141 lb 6.4 oz (64.139 kg)  BMI 25.05 kg/m2  HEENT:  Pupils equal.  She looks thinner than I remember her.  She has her O2 with her, but she is not using it. NECK:  Supple.  No thyroid mass. LYMPH NODES:  No cervical, supraclavicular, or axillary adenopathy. BREASTS -  RIGHT:  Absent   LEFT:  Absent CHEST:  She has a 1.5 cm ulcer with some surrounding erythema, but this looks much better than the picture that her daughter showed me from a couple of weeks ago.  I think that it is a healing abscess. UPPER EXTREMITIES:  No evidence of lymphedema.    Ulcer at lower sternum.    DATA REVIEWED: Epic - Cultures were not helpful.   Ovidio Kin, MD,  [facility] [address] [place]  [address]   [place], [place]    [address] Phone:  [phone] FAX:  [phone]

## 2013-05-08 ENCOUNTER — Ambulatory Visit (INDEPENDENT_AMBULATORY_CARE_PROVIDER_SITE_OTHER): Payer: Medicare Other | Admitting: *Deleted

## 2013-05-08 DIAGNOSIS — I4891 Unspecified atrial fibrillation: Secondary | ICD-10-CM

## 2013-05-08 DIAGNOSIS — Z7901 Long term (current) use of anticoagulants: Secondary | ICD-10-CM

## 2013-05-13 ENCOUNTER — Ambulatory Visit (INDEPENDENT_AMBULATORY_CARE_PROVIDER_SITE_OTHER): Payer: Medicare Other | Admitting: Surgery

## 2013-05-13 ENCOUNTER — Encounter (INDEPENDENT_AMBULATORY_CARE_PROVIDER_SITE_OTHER): Payer: Self-pay

## 2013-05-13 ENCOUNTER — Encounter (INDEPENDENT_AMBULATORY_CARE_PROVIDER_SITE_OTHER): Payer: Self-pay | Admitting: Surgery

## 2013-05-13 VITALS — BP 144/80 | HR 71 | Temp 98.6°F | Resp 14 | Ht 63.0 in | Wt 143.0 lb

## 2013-05-13 DIAGNOSIS — L089 Local infection of the skin and subcutaneous tissue, unspecified: Secondary | ICD-10-CM

## 2013-05-13 NOTE — Progress Notes (Signed)
[facility]  Ovidio Kin, MD,  [facility] [address] [place]  [address] [place], [place]    [address] Phone:  [phone] FAX:  [phone]   Re:   WERONIKA BIRCH DOB:   May 31, 1926 MRN:   [id number]  ASSESSMENT AND PLAN: 1.  Ulcer/healing skin wound over lower sternum  [photo at end of chart]  This looks better, just going slowly.  She'll see me back in 6 weeks.  2.  Right breast cancer  Right mastectomy by Dr. Kathie Rhodes. Blievernicht in 1996 3.  Left breast cancer  Left mastectomy by Dr. Algis Downs. Indalecio Malmstrom in 2002 4.  Chronic lung disease and pulmonary hypertension  Sees Dr. Jodelle Green 5.  Hypertension 6.  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 7.  Gout  HISTORY OF PRESENT ILLNESS: Chief Complaint  Patient presents with  . Follow-up   JALIZA SEIFRIED is a 79 y.o. (DOB: [birth year])  AA  female who is a patient of NADEL,SCOTT M, MD and comes to me today for follow up a sternal skin infection. Her daughter, Lonia Skinner, had to leave before I got to the room.  She is washing the area twice a day.  It looks smaller, it is just taking some time to heal. She had no specific concerns.  History of ulcer (04/30/2013): About 3 weeks ago, she noticed an infection no her lower sternum.   She went to the [facility] ER on 04/08/2013.  They did an I&D.  Cultures were obtained which remain negative - multiple organisms present.  She was putting Neosporin on the wound.  She saw Dr. Kriste Basque 04/07/2013 who asked [place] to see the patient.  Past Medical History  Diagnosis Date  . Allergic rhinitis   . Hypoxemia   . HTN (hypertension)   . Atrial fibrillation   . Sick sinus syndrome with tachycardia   . Venous insufficiency   . Borderline diabetes mellitus   . IBS (irritable bowel syndrome)   . Tubulovillous adenoma of colon 2006  . Adenocarcinoma, breast     bilateral  . DJD (degenerative joint disease)   . Headache(784.0)   . Anxiety   . Diverticulosis    SOCIAL HISTORY: Daughter,  Lonia Skinner.  PHYSICAL EXAM: BP 144/80  Pulse 71  Temp(Src) 98.6 F (37 C) (Temporal)  Resp 14  Ht 5\' 3"  (1.6 m)  Wt 143 lb (64.864 kg)  BMI 25.34 kg/m2  HEENT:  Pupils equal.  She looks thinner than I remember her.  She has her O2 with her, but she is not using it. NECK:  Supple.  No thyroid mass. LYMPH NODES:  No cervical, supraclavicular, or axillary adenopathy. BREASTS -  RIGHT:  Absent   LEFT:  Absent CHEST:  She has a 0.8 cm ulcer with 2.0 cm surrounding erythema.  It is getting better, just slowly. UPPER EXTREMITIES:  No evidence of lymphedema.    Ulcer at lower sternum.    DATA REVIEWED: Epic - Cultures were not helpful.   Ovidio Kin, MD,  [facility] [address] [place]  [address]   [place], [place]    [address] Phone:  [phone] FAX:  [phone]

## 2013-05-21 ENCOUNTER — Ambulatory Visit (INDEPENDENT_AMBULATORY_CARE_PROVIDER_SITE_OTHER): Payer: Medicare Other | Admitting: *Deleted

## 2013-05-21 DIAGNOSIS — I4891 Unspecified atrial fibrillation: Secondary | ICD-10-CM

## 2013-05-21 DIAGNOSIS — Z7901 Long term (current) use of anticoagulants: Secondary | ICD-10-CM

## 2013-05-21 LAB — POCT INR: INR: 2.1

## 2013-05-29 ENCOUNTER — Telehealth: Payer: Self-pay | Admitting: Pulmonary Disease

## 2013-05-29 MED ORDER — HYDROCODONE-HOMATROPINE 5-1.5 MG/5ML PO SYRP: 5.0000 mL | ORAL_SOLUTION | Freq: Four times a day (QID) | ORAL | Status: DC | PRN

## 2013-05-29 NOTE — Telephone Encounter (Signed)
Pt's daughter returning call can be reached at [phone].Cheyenne [name]

## 2013-05-29 NOTE — Telephone Encounter (Signed)
LMTCB

## 2013-05-29 NOTE — Telephone Encounter (Signed)
Rx is up front and ready to be picked up  Spoke with Selena Batten and notified of this  She verbalized understanding and nothing further needed

## 2013-05-29 NOTE — Telephone Encounter (Signed)
Called and spoke with kim---pts family member and she stated that the pt tried to refill the hycodan since she still had refills on this and they pharmacy told her that they could not refill this.  i explained the new law to kim and she is aware that the rx has been printed out and placed on SN cart to be signed and i will call her once this is ready to be picked up.

## 2013-06-08 ENCOUNTER — Other Ambulatory Visit: Payer: Self-pay | Admitting: Internal Medicine

## 2013-06-10 ENCOUNTER — Other Ambulatory Visit: Payer: Self-pay | Admitting: Internal Medicine

## 2013-06-12 ENCOUNTER — Ambulatory Visit (INDEPENDENT_AMBULATORY_CARE_PROVIDER_SITE_OTHER): Payer: Medicare Other | Admitting: *Deleted

## 2013-06-12 DIAGNOSIS — I4891 Unspecified atrial fibrillation: Secondary | ICD-10-CM

## 2013-06-12 DIAGNOSIS — Z7901 Long term (current) use of anticoagulants: Secondary | ICD-10-CM

## 2013-06-12 LAB — POCT INR: INR: 2.5

## 2013-06-24 ENCOUNTER — Encounter (INDEPENDENT_AMBULATORY_CARE_PROVIDER_SITE_OTHER): Payer: Medicare Other | Admitting: Surgery

## 2013-06-25 ENCOUNTER — Telehealth (INDEPENDENT_AMBULATORY_CARE_PROVIDER_SITE_OTHER): Payer: Self-pay

## 2013-06-25 NOTE — Telephone Encounter (Signed)
V/m RESCHEDULED HER APPT to 06/26/13@215p  with Dr. Lucia Gaskins

## 2013-06-26 ENCOUNTER — Telehealth (INDEPENDENT_AMBULATORY_CARE_PROVIDER_SITE_OTHER): Payer: Self-pay

## 2013-06-26 ENCOUNTER — Encounter (INDEPENDENT_AMBULATORY_CARE_PROVIDER_SITE_OTHER): Payer: Medicare Other | Admitting: Surgery

## 2013-06-26 NOTE — Telephone Encounter (Signed)
DiD not  check V/M yesterday appt rescheduled to 07-08-13@1030a  patient aware

## 2013-07-08 ENCOUNTER — Ambulatory Visit (INDEPENDENT_AMBULATORY_CARE_PROVIDER_SITE_OTHER): Payer: Medicare Other | Admitting: Surgery

## 2013-07-08 ENCOUNTER — Encounter (INDEPENDENT_AMBULATORY_CARE_PROVIDER_SITE_OTHER): Payer: Self-pay | Admitting: Surgery

## 2013-07-08 VITALS — BP 130/86 | HR 64 | Temp 98.2°F | Resp 14 | Ht 63.0 in | Wt 147.8 lb

## 2013-07-08 DIAGNOSIS — L089 Local infection of the skin and subcutaneous tissue, unspecified: Secondary | ICD-10-CM

## 2013-07-08 NOTE — Progress Notes (Signed)
[place], MD,  [address]  [place], [place]    [place] Phone:  [phone] FAX:  [phone]   Re:   Cheyenne [name] DOB:   1925/11/23 MRN:   [id number]  ASSESSMENT AND PLAN: 1.  Ulcer/healing skin wound over lower sternum  [photo at end of chart]  This area has healed.  She'll see me back in one year.  2.  Right breast cancer  Right mastectomy by Dr. Chauncey Cruel. [name] in 1996 3.  Left breast cancer  Left mastectomy by Dr. Keturah Barre. Tava [name] in 2002 4.  Chronic lung disease and pulmonary hypertension  Sees Dr. Jeannine [name] 5.  Hypertension 6.  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 7.  Gout  HISTORY OF PRESENT ILLNESS: Chief Complaint  Patient presents with  . Routine Post Op    f/u chest wall infection   Cheyenne [name] is a 80 y.o. (DOB: [birth year])  AA  female who is a patient of [name],Cheyenne M, MD and comes to me today for follow up a sternal skin infection. The wound has healed.  She is doing well.  History of ulcer (04/30/2013): About 3 weeks ago, she noticed an infection no her lower sternum.   She went to the [facility] ER on 04/08/2013.  They did an I&D.  Cultures were obtained which remain negative - multiple organisms present.  She was putting Neosporin on the wound.  She saw Dr. Lenna [name] 04/07/2013 who asked [place] to see the patient.  Past Medical History  Diagnosis Date  . Allergic rhinitis   . Hypoxemia   . HTN (hypertension)   . Atrial fibrillation   . Sick sinus syndrome with tachycardia   . Venous insufficiency   . Borderline diabetes mellitus   . IBS (irritable bowel syndrome)   . Tubulovillous adenoma of colon 2006  . Adenocarcinoma, breast     bilateral  . DJD (degenerative joint disease)   . Headache(784.0)   . Anxiety   . Diverticulosis    SOCIAL HISTORY: Daughter, Cheyenne [name].  PHYSICAL EXAM: BP 130/86  Pulse 64  Temp(Src) 98.2 F (36.8 C) (Temporal)  Resp 14  Ht 5\' 3"  (1.6 m)  Wt  147 lb 12.8 oz (67.042 kg)  BMI 26.19 kg/m2  General:  WN older AA F BREASTS -  RIGHT:  Absent   LEFT:  Absent CHEST:  Healed wound in epigastrium, just below the sternum.    Wound healed at lower sternum.    DATA REVIEWED: Epic - nothing new.   Cheyenne Overall, MD,  [facility], [address]  [place], [place]    [place] Phone:  [phone] FAX:  [phone]

## 2013-07-10 ENCOUNTER — Ambulatory Visit (INDEPENDENT_AMBULATORY_CARE_PROVIDER_SITE_OTHER): Payer: Medicare Other | Admitting: *Deleted

## 2013-07-10 DIAGNOSIS — I4891 Unspecified atrial fibrillation: Secondary | ICD-10-CM

## 2013-07-10 DIAGNOSIS — Z5181 Encounter for therapeutic drug level monitoring: Secondary | ICD-10-CM

## 2013-07-10 DIAGNOSIS — Z7901 Long term (current) use of anticoagulants: Secondary | ICD-10-CM

## 2013-07-10 LAB — POCT INR: INR: 2.8

## 2013-07-16 ENCOUNTER — Encounter (INDEPENDENT_AMBULATORY_CARE_PROVIDER_SITE_OTHER): Payer: Medicare Other | Admitting: Surgery

## 2013-08-17 ENCOUNTER — Ambulatory Visit (INDEPENDENT_AMBULATORY_CARE_PROVIDER_SITE_OTHER): Payer: Medicare Other | Admitting: *Deleted

## 2013-08-17 DIAGNOSIS — I4891 Unspecified atrial fibrillation: Secondary | ICD-10-CM

## 2013-08-17 DIAGNOSIS — Z7901 Long term (current) use of anticoagulants: Secondary | ICD-10-CM

## 2013-08-17 DIAGNOSIS — Z5181 Encounter for therapeutic drug level monitoring: Secondary | ICD-10-CM

## 2013-08-17 LAB — POCT INR: INR: 2.3

## 2013-08-25 ENCOUNTER — Telehealth: Payer: Self-pay | Admitting: Pulmonary Disease

## 2013-08-25 NOTE — Telephone Encounter (Signed)
Spoke with patient-she is aware of SN no longer being PCP as of 09-09-13; she is aware that she can continue to see SN in May for Pulmonary issues(listed in EPIC). Pt will contact us back tomorrow with PCP of choice as she would like to discuss with her daughter tonight.

## 2013-08-27 NOTE — Telephone Encounter (Signed)
Spoke with patient Requested to speak with Cheyenne [name] ONLY. Patient states that she is wanting a recommendation of a good PCP--pt states that she cannot find the list of the Drs given when she last spoke to Cheyenne [name] Pt states that she is getting ready to walk out the door. Will be home around 5pm. Pt states that she will try and call back if she gets home before 5pm, if not, will call in the AM.

## 2013-08-28 NOTE — Telephone Encounter (Signed)
Called and spoke with pt. She is going to call downstairs to the [place] office for appt.

## 2013-08-28 NOTE — Telephone Encounter (Signed)
Pt is calling back to speak with Leigh.

## 2013-08-31 ENCOUNTER — Telehealth: Payer: Self-pay | Admitting: Pulmonary Disease

## 2013-08-31 NOTE — Telephone Encounter (Signed)
Called and spoke with pt and she was given the number again to primary care at the [place] office.  She will call in the morning to set up with a new primary care doctor.

## 2013-09-04 ENCOUNTER — Telehealth: Payer: Self-pay | Admitting: Pulmonary Disease

## 2013-09-04 MED ORDER — HYDROCODONE-HOMATROPINE 5-1.5 MG/5ML PO SYRP: 5.0000 mL | ORAL_SOLUTION | Freq: Four times a day (QID) | ORAL | Status: DC | PRN

## 2013-09-04 NOTE — Telephone Encounter (Signed)
Called and spoke with pt and she is aware that we will print this rx out and have SN sign this on Monday and call her once this is ready to be picked up.  Pt voiced her understanding and nothing further is needed.   Will hold this message until Monday.

## 2013-09-07 MED ORDER — TRAMADOL HCL 50 MG PO TABS: 50.0000 mg | ORAL_TABLET | Freq: Three times a day (TID) | ORAL | Status: DC | PRN

## 2013-09-07 NOTE — Telephone Encounter (Signed)
rx has been signed by SN and placed up front for pt to pick up.  Pt also requested rx for the pain meds.  rx has been printed out for the  Tramadol.  Will place up front.

## 2013-09-16 ENCOUNTER — Telehealth: Payer: Self-pay | Admitting: Pulmonary Disease

## 2013-09-16 MED ORDER — OXYBUTYNIN CHLORIDE ER 10 MG PO TB24: 10.0000 mg | ORAL_TABLET | Freq: Every day | ORAL | Status: DC

## 2013-09-16 MED ORDER — FUROSEMIDE 40 MG PO TABS: 40.0000 mg | ORAL_TABLET | Freq: Every day | ORAL | Status: DC

## 2013-09-16 NOTE — Telephone Encounter (Signed)
Pt aware RX's have been sent. Nothing further needed 

## 2013-09-17 ENCOUNTER — Other Ambulatory Visit: Payer: Self-pay | Admitting: Pulmonary Disease

## 2013-09-17 NOTE — Telephone Encounter (Signed)
Spoke with pt. She reports she called downstairs for new PCP appt and was told they are not accepting new pt's.  I gave pt [place] # to call for appt. Please advise if okay to refill lasix, tramadol, ditropan. thanks

## 2013-09-28 ENCOUNTER — Encounter (INDEPENDENT_AMBULATORY_CARE_PROVIDER_SITE_OTHER): Payer: Self-pay

## 2013-09-28 ENCOUNTER — Ambulatory Visit (INDEPENDENT_AMBULATORY_CARE_PROVIDER_SITE_OTHER): Payer: Medicare Other | Admitting: *Deleted

## 2013-09-28 DIAGNOSIS — Z5181 Encounter for therapeutic drug level monitoring: Secondary | ICD-10-CM

## 2013-09-28 DIAGNOSIS — Z7901 Long term (current) use of anticoagulants: Secondary | ICD-10-CM

## 2013-09-28 DIAGNOSIS — I4891 Unspecified atrial fibrillation: Secondary | ICD-10-CM

## 2013-09-28 LAB — POCT INR: INR: 3.1

## 2013-10-01 ENCOUNTER — Other Ambulatory Visit: Payer: Self-pay | Admitting: Pulmonary Disease

## 2013-10-14 ENCOUNTER — Telehealth: Payer: Self-pay | Admitting: Pulmonary Disease

## 2013-10-14 MED ORDER — ALLOPURINOL 300 MG PO TABS: 300.0000 mg | ORAL_TABLET | Freq: Every day | ORAL | Status: DC

## 2013-10-14 NOTE — Telephone Encounter (Signed)
Called and spoke with pt and she is aware of rx that has been sent to the pharmacy.  Nothing further is needed.  

## 2013-10-20 ENCOUNTER — Ambulatory Visit: Payer: Medicare Other | Admitting: Physician Assistant

## 2013-10-28 ENCOUNTER — Ambulatory Visit (INDEPENDENT_AMBULATORY_CARE_PROVIDER_SITE_OTHER): Payer: Medicare Other | Admitting: Pulmonary Disease

## 2013-10-28 ENCOUNTER — Other Ambulatory Visit: Payer: Medicare Other

## 2013-10-28 ENCOUNTER — Encounter: Payer: Self-pay | Admitting: Pulmonary Disease

## 2013-10-28 ENCOUNTER — Ambulatory Visit (INDEPENDENT_AMBULATORY_CARE_PROVIDER_SITE_OTHER)
Admission: RE | Admit: 2013-10-28 | Discharge: 2013-10-28 | Disposition: A | Discharge status: Home / Self Care | Payer: Medicare Other | Source: Ambulatory Visit | Attending: Pulmonary Disease | Admitting: Pulmonary Disease

## 2013-10-28 ENCOUNTER — Other Ambulatory Visit (INDEPENDENT_AMBULATORY_CARE_PROVIDER_SITE_OTHER): Payer: Medicare Other

## 2013-10-28 VITALS — BP 144/80 | HR 52 | Temp 98.6°F | Ht 63.0 in | Wt 146.6 lb

## 2013-10-28 DIAGNOSIS — R3989 Other symptoms and signs involving the genitourinary system: Secondary | ICD-10-CM

## 2013-10-28 DIAGNOSIS — K589 Irritable bowel syndrome without diarrhea: Secondary | ICD-10-CM

## 2013-10-28 DIAGNOSIS — F419 Anxiety disorder, unspecified: Secondary | ICD-10-CM

## 2013-10-28 DIAGNOSIS — R609 Edema, unspecified: Secondary | ICD-10-CM | POA: Insufficient documentation

## 2013-10-28 DIAGNOSIS — D126 Benign neoplasm of colon, unspecified: Secondary | ICD-10-CM

## 2013-10-28 DIAGNOSIS — I10 Essential (primary) hypertension: Secondary | ICD-10-CM

## 2013-10-28 DIAGNOSIS — I272 Pulmonary hypertension, unspecified: Secondary | ICD-10-CM

## 2013-10-28 DIAGNOSIS — I2789 Other specified pulmonary heart diseases: Secondary | ICD-10-CM

## 2013-10-28 DIAGNOSIS — I872 Venous insufficiency (chronic) (peripheral): Secondary | ICD-10-CM

## 2013-10-28 DIAGNOSIS — I4891 Unspecified atrial fibrillation: Secondary | ICD-10-CM

## 2013-10-28 DIAGNOSIS — R7309 Other abnormal glucose: Secondary | ICD-10-CM

## 2013-10-28 DIAGNOSIS — F411 Generalized anxiety disorder: Secondary | ICD-10-CM

## 2013-10-28 DIAGNOSIS — M1990 Unspecified osteoarthritis, unspecified site: Secondary | ICD-10-CM

## 2013-10-28 DIAGNOSIS — Z853 Personal history of malignant neoplasm of breast: Secondary | ICD-10-CM

## 2013-10-28 DIAGNOSIS — K5730 Diverticulosis of large intestine without perforation or abscess without bleeding: Secondary | ICD-10-CM

## 2013-10-28 LAB — CBC WITH DIFFERENTIAL/PLATELET
Basophils Absolute: 0 10*3/uL (ref 0.0–0.1)
Basophils Relative: 0.5 % (ref 0.0–3.0)
EOS PCT: 2.8 % (ref 0.0–5.0)
Eosinophils Absolute: 0.2 10*3/uL (ref 0.0–0.7)
HCT: 37.5 % (ref 36.0–46.0)
Hemoglobin: 11.9 g/dL — ABNORMAL LOW (ref 12.0–15.0)
Lymphocytes Relative: 17.2 % (ref 12.0–46.0)
Lymphs Abs: 1.1 10*3/uL (ref 0.7–4.0)
MCHC: 31.8 g/dL (ref 30.0–36.0)
MCV: 96.5 fl (ref 78.0–100.0)
MONOS PCT: 9.2 % (ref 3.0–12.0)
Monocytes Absolute: 0.6 10*3/uL (ref 0.1–1.0)
Neutro Abs: 4.7 10*3/uL (ref 1.4–7.7)
Neutrophils Relative %: 70.3 % (ref 43.0–77.0)
Platelets: 168 10*3/uL (ref 150.0–400.0)
RBC: 3.89 Mil/uL (ref 3.87–5.11)
RDW: 15.9 % — ABNORMAL HIGH (ref 11.5–15.5)
WBC: 6.7 10*3/uL (ref 4.0–10.5)

## 2013-10-28 LAB — BASIC METABOLIC PANEL
BUN: 15 mg/dL (ref 6–23)
CHLORIDE: 107 meq/L (ref 96–112)
CO2: 33 meq/L — AB (ref 19–32)
Calcium: 9.7 mg/dL (ref 8.4–10.5)
Creatinine, Ser: 0.9 mg/dL (ref 0.4–1.2)
GFR: 72.64 mL/min (ref >60.00)
GLUCOSE: 89 mg/dL (ref 70–99)
POTASSIUM: 3.4 meq/L — AB (ref 3.5–5.1)
Sodium: 145 mEq/L (ref 135–145)

## 2013-10-28 LAB — TSH: TSH: 1.47 u[IU]/mL (ref 0.35–4.50)

## 2013-10-28 LAB — HEPATIC FUNCTION PANEL
ALBUMIN: 3.6 g/dL (ref 3.5–5.2)
ALT: 16 U/L (ref 0–35)
AST: 27 U/L (ref 0–37)
Alkaline Phosphatase: 63 U/L (ref 39–117)
Bilirubin, Direct: 0.2 mg/dL (ref 0.0–0.3)
TOTAL PROTEIN: 7.1 g/dL (ref 6.0–8.3)
Total Bilirubin: 1.1 mg/dL (ref 0.2–1.2)

## 2013-10-28 MED ORDER — ACETAZOLAMIDE 250 MG PO TABS: ORAL_TABLET | ORAL | Status: DC

## 2013-10-28 NOTE — Patient Instructions (Signed)
Today we updated your med list in our EPIC system...    Continue your current medications the same...  Today we did your follow up CXR & yearly blood work...    We will contact you w/ the results when available...   You need to increase your exercse program...    Silver sneakers is a Tour manager...  Call for any questions...  Let's plan a follow up visit in 37mo, sooner if needed for problems.Marland KitchenMarland Kitchen

## 2013-10-29 ENCOUNTER — Encounter: Payer: Self-pay | Admitting: Pulmonary Disease

## 2013-10-29 NOTE — Progress Notes (Signed)
Subjective:    Patient ID: Cheyenne [name], female    DOB: [birth year], 80 y.o.   MRN: [id number]  HPI 80 y/o BF here for a follow up visit... she has multiple medical problems as noted below>>>  ~  April 22, 2012:  33mo ROV & Cheyenne [name] has had a stable interval- no new complaints or concerns;  We reviewed the following problems today:    Hypoxemia, PulmHTN> on HomeO2 & Hycodan cough syrup; she feels her breathing is at baseline; last 2DEcho w/ PAsys~50 & stable...    HBP> on ASA81, Propran80Bid, Quinapril40, Lasix40, Diamox250-2/d, & K20; BP= 128/82 & she denies CP, palpit, ch in SOB or edema...    AFib, SSS, Tachy-brady> on Coumadin (via CC); followed by DrKlein for Cards/EP...    DM> on diet alone; she is down 8# to 150# today; BS is normal at 97...    GI- Divertics, IBS, Polyps> they received a letter from DrStark 7 daugh wants her to set up appt to see him...    GU- bladder symptoms> sounds like urge/stress incont- trial Oxybutynin10mg  7 refer to Urology...    Hx bilat breast cancers> s/p mastectomies in 1996 & 2002, followed by [facility] & doing well w/o known recurrence...    DJD> on Allopurinol 300, Tramadol50; stable w/ mod DJD & limited mobility (knees, hips)...    Early dementia, anxiety> Aware- family controls her meds 7 helps at home... We reviewed prob list, meds, xrays and labs> see below for updates >>  LABS 11/13:  Chems- ok w/ K=3.7 Co2=34 BUN=23 Creat=0.9;  BNP=470...   ~  Oct 20, 2012:  49mo ROV & Cheyenne [name] has been stable- had some recent dizziness, otherw ok;  We reviewed the following medical problems during today's office visit >>     Hypoxemia, PulmHTN> on HomeO2 & Hycodan cough syrup; she feels her breathing is at baseline; last 2DEcho w/ PAsys~50 & stable...    HBP> on ASA81, Propran80Bid, Quinapril40, Lasix40, Diamox250-2/d, & K20Bid; BP= 136/80 & she denies CP, palpit, ch in SOB or edema...    AFib, SSS, Tachy-brady> on Coumadin (via CC); followed by DrKlein for  Cards/EP...    DM> on diet alone; she is down 6# to 144# today; BS is normal at 102...    GI- Divertics, IBS, Polyps> on Immodium prn; she had appt w/ DrStark 12/13> IBS, hx divertics & colon polyps, he did not rec surveillance colonoscopy for her...    GU- bladder symptoms> sounds like urge/stress incont- trial Oxybutynin10mg  & eval by Urology DrManny> rec changing diuretics if poss but it is not poss...    Hx bilat breast cancers> s/p mastectomies in 1996 & 2002, followed by [facility] & doing well w/o known recurrence...    DJD> on Allopurinol 300, Tramadol50; stable w/ mod DJD & limited mobility (knees, hips)...    Early dementia, anxiety> Aware- family controls her meds & helps at home... We reviewed prob list, meds, xrays and labs> see below for updates >>  CXR 5/14 showed cardiomeg, clear lungs, DJD spine, NAD... LABS 5/14:  Chems- ok x K=3.4;  CBC- ok w/ Hg=12.3;  TSH=1.38;  VitD=31;  BNP=463...  ~  April 28, 2013:  71mo ROV & Cheyenne [name] developed a skin lesion on her ant lower chest wall over the xyphoid & went to the ER 10/29- ?infected cyst ?other etiology; they did an I&D (note says it drained purulent material) & packed w/ iodoform gauze; placed her on DoxyBid & told to f/u here; C&S grew mult  organisms no staph aureus recovered;  Today we removed the iodoform gauze & cleaned the wound, no pus or drainage, can still palp a subcut knot/lesion & there is a mod-arge defect=> rec f/u by [facility] (she has seen [facility] in the past for her breast cancer surg) as this may require excision... We reviewed the following medical problems during today's office visit >>     Hypoxemia, PulmHTN> on HomeO2, ProairHFA prn, & Hycodan cough syrup; she feels her breathing is at baseline; last 2DEcho w/ PAsys~50 & stable...    HBP> on ASA81, Propran80-1/2Bid, Quinapril40, Lasix40, Diamox250-2/d, & K20Bid; BP= 142/80 & she denies CP, palpit, ch in SOB or edema...    AFib, SSS, Tachy-brady> on Coumadin (via [facility]);  followed by DrKlein for Cards/EP...    DM> on diet alone; wt is stable at 143# today; BS has been normal ~100...    GI- Divertics, IBS, Polyps> on Immodium prn; she had appt w/ DrStark 12/13> IBS, hx divertics & colon polyps, he did not rec surveillance colonoscopy for her...    GU- bladder symptoms> she has freq & urge/stress incont- trial Oxybutynin10mg  & eval by Urology DrManny> rec changing diuretics if poss but it is not poss...    Hx bilat breast cancers> s/p mastectomies in 1996 & 2002, followed by [facility] & doing well w/o known recurrence...    DJD> on Allopurinol 300, Tramadol50; stable w/ mod DJD & limited mobility (knees, hips)...    Early dementia, anxiety> Aware- family controls her meds & helps at home... We reviewed prob list, meds, xrays and labs> see below for updates >> ok Flu vaccine today...  ~  Oct 28, 2013:  72mo ROV & Cheyenne [name] reports doing satis overall just some incr edema in ankles assoc w/ 5# wt gain but denies CP, palpit, ch in SOB/DOE, cough, sput, etc...     Hypoxemia, PulmHTN> on HomeO2, ProairHFA prn, & Hycodan cough syrup; she feels her breathing is at baseline; last 2DEcho 5/13 w/ PAsys~50 & stable...    HBP> on ASA81, Propran80-1/2Bid, Quinapril40, Lasix40, Diamox250-2/d, & K20Bid; BP= 144/80 & she denies CP, palpit, ch in SOB, but has developed ankle edema & 5# wt gain...    AFib, SSS, Tachy-brady> on Coumadin (via CC); followed by DrKlein for Cards/EP.Marland KitchenMarland Kitchen    Venous Insuffic & edema> she has incr ankle edema 7 rec to elim sodium, elev legs, wear support hose & continue same diuretics...    DM> on diet alone; wt is up at 147# today; BS has been normal ~100...    GI- Divertics, IBS, Polyps> on Immodium prn; she had appt w/ DrStark 12/13> IBS, hx divertics & colon polyps, he did not rec surveillance colonoscopy for her...    GU- bladder symptoms> she has freq & urge/stress incont- trial Oxybutynin10mg  & eval by Urology DrManny> rec changing diuretics if poss but it is  not poss...    Hx bilat breast cancers> s/p mastectomies in 1996 & 2002, followed by [facility] & doing well w/o known recurrence...    DJD> on Allopurinol 300, Tramadol50; stable w/ mod DJD & limited mobility (knees, hips)...    Early dementia, anxiety> Aware- family helps w/ her meds & helps at home... We reviewed prob list, meds, xrays and labs> see below for updates >>   CXR 5/15 showed mild cardiomeg, clear lungs, DJD spine, surg clips right axilla, NAD...  LABS 5/15:  Chems- ok x K=3.4 on K20/d & asked to incr back to Bid;    Ambulate on RA:  96% O2sat on RA at rest w/ HR=52;  87% ob RA after 1lap w/ HR=109 => continue same O2 therapy...            Problem List:  ALLERGIC RHINITIS (ICD-477.9) - on FLONASE Qhs & ZYRTEK QAM Prn.  Hx of HYPOXEMIA (ICD-799.02) - full eval for hypoxemia in [id number]  ~  7/08:  CXR= cardiomegaly, ectatic Ao, pulm ven HTN... CTAngio= neg, without PE, min scarring in lingula... 2DEcho= norm LVF w/ EF=55%, LVwall thickness at upper limits, mild ca++ AoV w/ mild AI,  PA sys pressure = 7mmHg... O2 sats 90-94 at rest on RA... w/ ambulation in the office sats drop to 85%... she didn't yet want to go on oxygen at home... just occas SOB w/ activity- she is too sedentary and needs to incr her exercise program. ~  CTChest 6/09 showed bilat mastectomies, no adenopathy, no fluid, 2 sm nodules 4-35mm size RML/LLL, NAD... ~  6/10: c/o increased DOE & no energy- O2 sat= 91% rest & 86% w/activ... rec> home O2 1L/min rest, 2L/min exercise. ~  4/11 hosp> V/Q scan w/ normal vent & norm perfusion... CXR 4/11 showed cardiomeg, clear lungs, no CHF, surg clips in right axilla... ~  8/11:  pt requesting change to LiqO2 to incr mobility. ~  11/12:  O2 sat= 94% at rest on 2L/min oxygen via [place]... remains stable. ~  11/13:  O2 sat= 100% on 2L/min at rest... ~  CXR 5/14 showed cardiomeg, clear lungs, DJD spine, NAD ~  99991111:  O2 recert today> O2 sat on RA at rest=96% w/ HR=65/min;   Ambulated 3 laps on RA w/ nadir O2 sat=88% w/ HR=116; Rec to continue O2 w/ exercise & Qhs... ~  CXR 5/15 showed mild cardiomeg, clear lungs, DJD spine, surg clips right axilla, NAD.Marland Kitchen. ~  5/15: Ambulate on RA:  96% O2sat on RA at rest w/ HR=52;  87% ob RA after 1lap w/ HR=109 => continue same O2 therapy  HYPERTENSION (ICD-401.9) - controlled on PROPRANOLOL 80mg Bid, QUINAPRIL 40mg /d; and back on LASIX 40mg AM, KCL 75mEqAM, & ACETAZOLAMIDE 250mg - 2tabsPM... she knows to avoid sodium etc... ~  labs 2/09 showed Na143, K3.6, CL101, CO2 36, BUN 15, Cr0.9.Marland KitchenMarland Kitchen BNP was 194... ~  labs 6/09 showed lytes normal x TCO2= 37, & BNP= 512... ~  labs 12/09 showed HCO3- 36,  BNP= 586... rec- same meds, no salt! ~  labs 6/10 showed Na= 146, K= 3.6, CO2= 36, BUN= 14, Creat= 0.9, BNP= 336. ~  labs 9/10 showed Na-147, K=3.7, HCO3=36, BUN=18, Creat=0.9, BNP=484 ~  labs 1/11 showed Na=144, K=3.9, HCO3=35, BUN=17, Creat=0.9, BNP=488 ~  [facility] 4/11 & meds changed by TH> off diuretics & KCl... CXR showed cardiomeg, clear lungs, clips right axilla. ~  labs 10/15/09 showed Na=143, K=4.0, HCO3=37, BUN=12, Creat=0.8, BNP=761... restart Lasix, Diamox, KCl (one each). ~  labs 10/25/09 showed HCO3=44, BNP=588... rec> incr Diamox to 2Qpm... ~  labs 8/11 showed HCO3=39, BNP=481... continue same. ~  Labs 5/12 showed HCO3=35, BNP=373... Improved, continue same Rx. ~  Labs 11/12 showed HCO3=37, BNP=351... Stable continue same meds. ~  5/13: BP= 138/88 & HCO3=39, BUN=17, Creat=0.9, BNP=534... Reminded to take meds daily. ~  11/3: on ASA81, Propran80Bid, Quinapril40, Lasix40, Diamox250-2/d, & K20; BP= 128/82 & she denies CP, palpit, ch in SOB or edema...  ~  5/14: on ASA81, Propran80Bid, Quinapril40, Lasix40, Diamox250-2/d, & K20Bid; BP= 136/80 & she denies CP, palpit, ch in SOB or edema ~  11/14: on ASA81, Propran80-1/2Bid, Quinapril40, Lasix40, Diamox250-2/d, &  K20Bid; BP= 142/80 & she remains essentially asymptomatic... ~  5/15: on ASA81,  Propran80-1/2Bid, Quinapril40, Lasix40, Diamox250-2/d, & K20Bid; BP= 144/80 & she denies CP, palpit, ch in SOB, but has developed ankle edema & 5# wt gain  ATRIAL FIBRILLATION (ICD-427.31) & SICK SINUS/ TACHY-BRADY SYNDROME (ICD-427.81) - new prob 1/10 w/ eval by DrKlein- rate control strategy on Propranolol w/ [facility]. ~  7/08:  2DEcho= norm LVF w/ EF=55%, LVwall thickness at upper limits, mild ca++ AoV w/ mild AI,  PA sys pressure = 58mmHg... ~  EKG 1/10 showed AFib, rate~ 60, NSSTTWA... ~  2DEcho 3/10 showed norm LVF w/ EF= 60% & no regional wall motion abn, mild calcif AoV w/ mild AI, mild LA & RA dil w/ incr thickness of interatrial septum c/w lipomatous hypertrophy, PA sys est at 50... ~  Holter monitor 3/10 = AFib w/ rate 45-97... ~  2DEcho 4/11 in hosp showed norm LVwall thickness & EF=55%, mild RVdil w/ mod decr RV function, mod TR & PAsys~69... ~  2DEcho 5/13 showed normal LVF w/ EF=55%, mild AI & MR, mild LA & RA dilatation, PAsys=49... Stable. ~  Stable on Coumadin & followed by DrKlein...  PULMONARY HYPERTENSION >> Serial 2DEchos showed progressive incr PA sys estimates... But the most recent 2DEcho 5/13 was sl improved (see above). ~  V/Q lung scan 4/11 was neg for PE...  VENOUS INSUFFICIENCY (ICD-459.81) - mild persist edema despite the sodium restriction, Lasix and Diamox... reviewed recommendation for No Salt, Elevation, TED's, etc...  DIABETES MELLITUS, BORDERLINE (ICD-790.29) - prev on Metformin 500mg /d (held after 4/11 hosp)... ~  last BS=181, HgA1c=6.4 in 11/08... ~  labs 6/09 showed BS= 101, HgA1c= 6.4 ~  labs 12/09 showed BS= 139,  HgA1c= 6.1 ~  labs 6/10 showed BS= 123, A1c= 6.1 ~  7/10: neg ophthal f/u DrHecker- no retinopathy, no macular edema. ~  labs 9/10 showed BS= 93 ~  labs 1/11 showed BS=146, A1c=5.7 ~  labs 5/11 showed BS= 100 on diet alone... ~  labs 8/11 showed BS= 96 ~  Labs 5/12 showed BS= 113, A1c= 5.8 ~  9/12:  Ophthalmology  check by DrHecker- no DM retinopathy... ~  Labs 11/12 showed BS= 104 ~  Labs 5/13 showed BS= 118 ~  Labs 11/13 showed BS= 97 ~  2/14: she had a neg Ophthalmology eval by DrHecker... ~  Labs 5/14 showed BS= 102 on diet alone...  DIVERTICULOSIS OF COLON, IRRITABLE BOWEL SYNDROME & COLONIC POLYPS (ICD-211.3) ~  colonoscopy was 11/06 by DrStark showing divertics and several polyps (one 30mm adenoma)... f/u 38yrs. ~  colonoscopy 1/10 by DrStark showed divertics (severe in sigmoid), 24mm polyp= tub adenoma... f/u 3 yrs. ~  12/13:  She had GI f/u DrStark> IBS w/ freq loose stools, occas BRB, hx extensive divertics & adenomatous polyps, on coumadin for AFib;   URINARY FREQ&URGENCY >>  ~  11/13: she had a GU eval by DrManny> c/o freq & urgency assoc w/ Lasix, some leakage & managing w/ one pad/d, some better w/ Oxybutynin... ~  5/14: she had f/u DrManny, Urology> urinary freq & urgency; worse w/ Lasix; mild leakage & uses 1 pad/d; Oxybutynin caused dry mouth; situation is acceptable & no further meds rec...  BILAT BREAST CANCERS - resected via mastectomies in 1996 and 2002... followed by [facility] annually & seen 8/12> note reviewed. ~  1/15: she had her yearly f/u by [facility]- his note is reviewed, stable, wound over sternum is resolved...   DEGENERATIVE JOINT  DISEASE (ICD-715.90) - w/ hx hyperuricemia on ALLOPURINOL 300mg /d & has TRAMADOL 50mg  prn pain...  Hx of HEADACHE (ICD-784.0) ~  MRI Brain 4/11 showed atrophy & sm vessel dis, degen changes at C1-2 w/ some sp stenosis caused part by ant slip of the C1 ring, incidental part empty sella...  ANXIETY (ICD-300.00) - prev rec to take Alpraz 0,5mg  Prn but she never filled the Rx. ~  Remeron 7.5mg /d started 4/11 hosp & stopped 5/11 due to lethargy, sleepiness...   Past Surgical History  Procedure Laterality Date  . Double mastectomy    . Knee surgery  2004    RIGHT  . Cataract extraction    . Cholecystectomy    . Mastectomy  1996    right  breast for breast cancer  . Mastectomy  1/02    left - Dr Lucia Gaskins  . Total knee arthroplasty  6/04    right - Dr Telford Nab  . Breast surgery      Outpatient Encounter Prescriptions as of 10/28/2013  Medication Sig  . acetaZOLAMIDE (DIAMOX) 250 MG tablet TAKE 2 TABLETS DAILY AT 4PM  . albuterol (PROVENTIL HFA;VENTOLIN HFA) 108 (90 BASE) MCG/ACT inhaler Inhale 1-2 puffs into the lungs every 6 (six) hours as needed for wheezing or shortness of breath.  . allopurinol (ZYLOPRIM) 300 MG tablet Take 1 tablet (300 mg total) by mouth daily.  Marland Kitchen aspirin 81 MG tablet Take 81 mg by mouth daily.    . Cholecalciferol (VITAMIN D) 2000 UNITS CAPS Take 1 capsule by mouth daily.  . furosemide (LASIX) 40 MG tablet TAKE 1 TABLET BY MOUTH DAILY  . HYDROcodone-homatropine (HYCODAN) 5-1.5 MG/5ML syrup Take 5 mLs by mouth every 6 (six) hours as needed for cough. TAKE 1 TEASPOON EVERY 4 HOURS AS NEEDED FOR COUGH  . loperamide (IMODIUM) 2 MG capsule Take 2 mg by mouth as needed for diarrhea or loose stools.   . Multiple Vitamin (MULTIVITAMIN) capsule Take 1 capsule by mouth daily.    Marland Kitchen oxybutynin (DITROPAN-XL) 10 MG 24 hr tablet TAKE 1 TABLET (10 MG TOTAL) BY MOUTH DAILY. FOR BLADDER SYMPTOMS  . potassium chloride SA (K-DUR,KLOR-CON) 20 MEQ tablet TAKE 1 TABLET (20 MEQ TOTAL) BY MOUTH 2 (TWO) TIMES DAILY.  Marland Kitchen propranolol (INDERAL) 80 MG tablet Take 1/2 tablet by mouth two times daily  . quinapril (ACCUPRIL) 40 MG tablet TAKE 1 TABLET (40 MG TOTAL) BY MOUTH DAILY.  . traMADol (ULTRAM) 50 MG tablet TAKE 1 TABLET BY MOUTH 3 TIMES A DAY AS NEEDED FOR PAIN  . warfarin (COUMADIN) 5 MG tablet TAKE AS DIRECTED BY COUMADIN CLINIC  . [DISCONTINUED] acetaZOLAMIDE (DIAMOX) 250 MG tablet TAKE 2 TABLETS DAILY AT 4PM    Allergies  Allergen Reactions  . Iohexol      Desc: IV History sheet from prior CT states IV Contrast allergy- 13 hour prep given.     Current Medications, Allergies, Past Medical History, Past Surgical History,  Family History, and Social History were reviewed in [employer] record.    Review of Systems         See HPI - all other systems neg except as noted...  The patient complains of dyspnea on exertion and peripheral edema.  The patient denies anorexia, fever, weight loss, weight gain, vision loss, decreased hearing, hoarseness, chest pain, syncope, prolonged cough, headaches, hemoptysis, abdominal pain, melena, hematochezia, severe indigestion/heartburn, hematuria, incontinence, muscle weakness, suspicious skin lesions, transient blindness, difficulty walking, depression, unusual weight change, abnormal bleeding, enlarged lymph nodes,  and angioedema.     Objective:   Physical Exam      WD, WN, 80 y/o BF in NAD... GENERAL:  Alert & oriented; pleasant & cooperative... HEENT:  [place]/AT, EOM-wnl, PERRLA, EACs-clear, TMs-wnl, NOSE-clear, THROAT-clear & wnl. NECK:  Supple w/ fairROM; no JVD; normal carotid impulses w/o bruits; no thyromegaly or nodules palpated; no lymphadenopathy. CHEST:  Clear to P & A; without wheezes/ rales/ or rhonchi heard... HEART:  irregular rhythm; gr 1/6 SEM without rubs or gallops appreciated... ABDOMEN:  Soft & nontender; normal bowel sounds; no organomegaly or masses detected. EXT: without deformities, mild arthritic changes; no varicose veins/ +venous insuffic/ 1-2+edema. NEURO:  CN's intact; no focal neuro deficits... DERM:  There is a rounded lesion left calf c/w ringworm...  RADIOLOGY DATA:  Reviewed in the EPIC EMR & discussed w/ the patient...  LABORATORY DATA:  Reviewed in the EPIC EMR & discussed w/ the patient...   Assessment & Plan:    HYPOXEMIA & CO2 retention>  Stable on her O2 regularly & w/ diuretic regimen including Acetazolamide, continue same... PULM HYPERTENSION ?Etiology>  We rechecked 2DEcho 5/13 & everything looks stable compared to 52yrs prev, continue same meds for now.  HBP>  Controlled on Propranolol, Quinapril, Lasix,  & Diamox; continue same...  AFib>  Stable on rate control strategy, no apparent prob w/ tachy or brady; continue same meds...  DM>  Well maintained on diet Rx...  GI>  Divertics, IBS, Polyps>  Stable, we discussed Simethacone Rx for gas...  GU> trial Oxybutynin10 for bladder symptoms and refer to Urology per daugh request...  Hx bilat breast cancers>  Stable, followed by [facility], no known recurrence... Skin lesion on chest wall resolved w/ Rx from [facility]...  DJD>  Hx hyperuricemia on Allopurinol Rx & no clinical attacks...  Anxiety>  Stable on Prn Alprazolam Rx...   Patient's Medications  New Prescriptions   No medications on file  Previous Medications   ALBUTEROL (PROVENTIL HFA;VENTOLIN HFA) 108 (90 BASE) MCG/ACT INHALER    Inhale 1-2 puffs into the lungs every 6 (six) hours as needed for wheezing or shortness of breath.   ALLOPURINOL (ZYLOPRIM) 300 MG TABLET    Take 1 tablet (300 mg total) by mouth daily.   ASPIRIN 81 MG TABLET    Take 81 mg by mouth daily.     CHOLECALCIFEROL (VITAMIN D) 2000 UNITS CAPS    Take 1 capsule by mouth daily.   FUROSEMIDE (LASIX) 40 MG TABLET    TAKE 1 TABLET BY MOUTH DAILY   HYDROCODONE-HOMATROPINE (HYCODAN) 5-1.5 MG/5ML SYRUP    Take 5 mLs by mouth every 6 (six) hours as needed for cough. TAKE 1 TEASPOON EVERY 4 HOURS AS NEEDED FOR COUGH   LOPERAMIDE (IMODIUM) 2 MG CAPSULE    Take 2 mg by mouth as needed for diarrhea or loose stools.    MULTIPLE VITAMIN (MULTIVITAMIN) CAPSULE    Take 1 capsule by mouth daily.     OXYBUTYNIN (DITROPAN-XL) 10 MG 24 HR TABLET    TAKE 1 TABLET (10 MG TOTAL) BY MOUTH DAILY. FOR BLADDER SYMPTOMS   POTASSIUM CHLORIDE SA (K-DUR,KLOR-CON) 20 MEQ TABLET    TAKE 1 TABLET (20 MEQ TOTAL) BY MOUTH 2 (TWO) TIMES DAILY.   PROPRANOLOL (INDERAL) 80 MG TABLET    Take 1/2 tablet by mouth two times daily   QUINAPRIL (ACCUPRIL) 40 MG TABLET    TAKE 1 TABLET (40 MG TOTAL) BY MOUTH DAILY.   TRAMADOL (ULTRAM) 50 MG TABLET  TAKE 1  TABLET BY MOUTH 3 TIMES A DAY AS NEEDED FOR PAIN   WARFARIN (COUMADIN) 5 MG TABLET    TAKE AS DIRECTED BY COUMADIN CLINIC  Modified Medications   Modified Medication Previous Medication   ACETAZOLAMIDE (DIAMOX) 250 MG TABLET acetaZOLAMIDE (DIAMOX) 250 MG tablet      TAKE 2 TABLETS DAILY AT 4PM    TAKE 2 TABLETS DAILY AT 4PM  Discontinued Medications   No medications on file

## 2013-11-03 ENCOUNTER — Ambulatory Visit: Payer: Medicare Other | Admitting: Family Medicine

## 2013-11-05 ENCOUNTER — Encounter: Payer: Self-pay | Admitting: Pulmonary Disease

## 2013-11-05 ENCOUNTER — Ambulatory Visit (INDEPENDENT_AMBULATORY_CARE_PROVIDER_SITE_OTHER): Payer: Medicare Other | Admitting: Pharmacist Clinician (PhC)/ Clinical Pharmacy Specialist

## 2013-11-05 DIAGNOSIS — Z5181 Encounter for therapeutic drug level monitoring: Secondary | ICD-10-CM

## 2013-11-05 DIAGNOSIS — I4891 Unspecified atrial fibrillation: Secondary | ICD-10-CM

## 2013-11-05 DIAGNOSIS — Z7901 Long term (current) use of anticoagulants: Secondary | ICD-10-CM

## 2013-11-05 LAB — POCT INR: INR: 2.1

## 2013-11-23 ENCOUNTER — Other Ambulatory Visit: Payer: Self-pay | Admitting: Internal Medicine

## 2013-12-18 ENCOUNTER — Ambulatory Visit (INDEPENDENT_AMBULATORY_CARE_PROVIDER_SITE_OTHER): Payer: Medicare Other

## 2013-12-18 DIAGNOSIS — I4891 Unspecified atrial fibrillation: Secondary | ICD-10-CM

## 2013-12-18 DIAGNOSIS — Z5181 Encounter for therapeutic drug level monitoring: Secondary | ICD-10-CM

## 2013-12-18 DIAGNOSIS — Z7901 Long term (current) use of anticoagulants: Secondary | ICD-10-CM

## 2013-12-18 LAB — POCT INR: INR: 2.1

## 2013-12-24 ENCOUNTER — Telehealth: Payer: Self-pay | Admitting: Pulmonary Disease

## 2013-12-24 NOTE — Telephone Encounter (Signed)
LMTCB

## 2013-12-25 NOTE — Telephone Encounter (Signed)
LMTCBx2. Damira Kem, CMA  

## 2013-12-28 MED ORDER — HYDROCODONE-HOMATROPINE 5-1.5 MG/5ML PO SYRP: 5.0000 mL | ORAL_SOLUTION | Freq: Four times a day (QID) | ORAL | Status: DC | PRN

## 2013-12-28 NOTE — Telephone Encounter (Signed)
Last OV 10-28-13. I spoke with the pt and she is requesting a refill on hycodan cough syrup. Last fill on 09-04-13 for # 111ml. Pt aware we will contact her once rx is ready for pick-up. Pt is c/o a dry cough that is worse at night.  Please advise. Benton Bing, CMA  Allergies  Allergen Reactions  . Iohexol      Desc: IV History sheet from prior CT states IV Contrast allergy- 13 hour prep given.

## 2013-12-28 NOTE — Telephone Encounter (Signed)
Called and spoke with pt and she is aware of rx that is ready to be picked up.  This has been left up front and nothing further is needed.

## 2013-12-28 NOTE — Telephone Encounter (Signed)
Per SN---  Ok for the refill of the hycodan.  This has been printed out and placed on SN cart to be signed.  Will call the pt once this is ready to be picked up.

## 2014-01-21 ENCOUNTER — Ambulatory Visit (INDEPENDENT_AMBULATORY_CARE_PROVIDER_SITE_OTHER): Payer: Medicare Other | Admitting: Pharmacist Clinician (PhC)/ Clinical Pharmacy Specialist

## 2014-01-21 DIAGNOSIS — I4891 Unspecified atrial fibrillation: Secondary | ICD-10-CM

## 2014-01-21 DIAGNOSIS — Z7901 Long term (current) use of anticoagulants: Secondary | ICD-10-CM

## 2014-01-21 DIAGNOSIS — Z5181 Encounter for therapeutic drug level monitoring: Secondary | ICD-10-CM

## 2014-01-21 LAB — POCT INR: INR: 2.4

## 2014-01-27 ENCOUNTER — Telehealth: Payer: Self-pay | Admitting: Pulmonary Disease

## 2014-01-27 MED ORDER — POTASSIUM CHLORIDE CRYS ER 20 MEQ PO TBCR: 20.0000 meq | EXTENDED_RELEASE_TABLET | Freq: Two times a day (BID) | ORAL | Status: DC

## 2014-01-27 MED ORDER — POTASSIUM CHLORIDE CRYS ER 20 MEQ PO TBCR: EXTENDED_RELEASE_TABLET | ORAL | Status: DC

## 2014-01-27 NOTE — Telephone Encounter (Signed)
Called and spoke with pharmacy and they stated that the rx that was sent in was for the packets of the potassium and the pt has been using the tablets.  New rx has been sent to the pharmacy and nothing further is needed.

## 2014-01-27 NOTE — Telephone Encounter (Signed)
Refill has been sent to the pharmacy per pts request.  i have called and made her aware.

## 2014-02-24 ENCOUNTER — Ambulatory Visit (INDEPENDENT_AMBULATORY_CARE_PROVIDER_SITE_OTHER): Payer: Medicare Other | Admitting: *Deleted

## 2014-02-24 DIAGNOSIS — I4891 Unspecified atrial fibrillation: Secondary | ICD-10-CM

## 2014-02-24 DIAGNOSIS — Z7901 Long term (current) use of anticoagulants: Secondary | ICD-10-CM

## 2014-02-24 DIAGNOSIS — Z5181 Encounter for therapeutic drug level monitoring: Secondary | ICD-10-CM

## 2014-02-24 LAB — POCT INR: INR: 1.9

## 2014-03-05 ENCOUNTER — Telehealth: Payer: Self-pay | Admitting: Pulmonary Disease

## 2014-03-05 MED ORDER — PROPRANOLOL HCL 80 MG PO TABS: ORAL_TABLET | ORAL | Status: DC

## 2014-03-05 NOTE — Telephone Encounter (Signed)
Refill has been sent to the pharmacy.  

## 2014-03-24 ENCOUNTER — Ambulatory Visit (INDEPENDENT_AMBULATORY_CARE_PROVIDER_SITE_OTHER): Payer: Medicare Other | Admitting: Pharmacist

## 2014-03-24 DIAGNOSIS — I4891 Unspecified atrial fibrillation: Secondary | ICD-10-CM

## 2014-03-24 DIAGNOSIS — Z7901 Long term (current) use of anticoagulants: Secondary | ICD-10-CM

## 2014-03-24 DIAGNOSIS — Z5181 Encounter for therapeutic drug level monitoring: Secondary | ICD-10-CM

## 2014-03-24 LAB — POCT INR: INR: 1.9

## 2014-04-07 ENCOUNTER — Other Ambulatory Visit: Payer: Self-pay | Admitting: Pulmonary Disease

## 2014-04-16 ENCOUNTER — Ambulatory Visit (INDEPENDENT_AMBULATORY_CARE_PROVIDER_SITE_OTHER): Payer: Medicare Other | Admitting: *Deleted

## 2014-04-16 DIAGNOSIS — I4891 Unspecified atrial fibrillation: Secondary | ICD-10-CM

## 2014-04-16 DIAGNOSIS — Z7901 Long term (current) use of anticoagulants: Secondary | ICD-10-CM

## 2014-04-16 DIAGNOSIS — Z5181 Encounter for therapeutic drug level monitoring: Secondary | ICD-10-CM

## 2014-04-16 LAB — POCT INR: INR: 2.5

## 2014-04-30 ENCOUNTER — Encounter: Payer: Self-pay | Admitting: Pulmonary Disease

## 2014-04-30 ENCOUNTER — Other Ambulatory Visit (INDEPENDENT_AMBULATORY_CARE_PROVIDER_SITE_OTHER): Payer: Medicare Other

## 2014-04-30 ENCOUNTER — Ambulatory Visit (INDEPENDENT_AMBULATORY_CARE_PROVIDER_SITE_OTHER): Payer: Medicare Other | Admitting: Pulmonary Disease

## 2014-04-30 VITALS — BP 120/80 | HR 58 | Temp 98.2°F | Ht 63.0 in | Wt 155.8 lb

## 2014-04-30 DIAGNOSIS — I270 Primary pulmonary hypertension: Secondary | ICD-10-CM

## 2014-04-30 DIAGNOSIS — I4819 Other persistent atrial fibrillation: Secondary | ICD-10-CM

## 2014-04-30 DIAGNOSIS — I272 Pulmonary hypertension, unspecified: Secondary | ICD-10-CM

## 2014-04-30 DIAGNOSIS — I10 Essential (primary) hypertension: Secondary | ICD-10-CM

## 2014-04-30 DIAGNOSIS — I481 Persistent atrial fibrillation: Secondary | ICD-10-CM

## 2014-04-30 DIAGNOSIS — I872 Venous insufficiency (chronic) (peripheral): Secondary | ICD-10-CM

## 2014-04-30 DIAGNOSIS — M150 Primary generalized (osteo)arthritis: Secondary | ICD-10-CM

## 2014-04-30 DIAGNOSIS — Z23 Encounter for immunization: Secondary | ICD-10-CM

## 2014-04-30 DIAGNOSIS — M159 Polyosteoarthritis, unspecified: Secondary | ICD-10-CM

## 2014-04-30 DIAGNOSIS — R609 Edema, unspecified: Secondary | ICD-10-CM

## 2014-04-30 LAB — BASIC METABOLIC PANEL
BUN: 16 mg/dL (ref 6–23)
CHLORIDE: 105 meq/L (ref 96–112)
CO2: 33 mEq/L — ABNORMAL HIGH (ref 19–32)
CREATININE: 0.9 mg/dL (ref 0.4–1.2)
Calcium: 9.3 mg/dL (ref 8.4–10.5)
GFR: 72.55 mL/min (ref >60.00)
Glucose, Bld: 90 mg/dL (ref 70–99)
POTASSIUM: 4.1 meq/L (ref 3.5–5.1)
Sodium: 142 mEq/L (ref 135–145)

## 2014-04-30 MED ORDER — POTASSIUM CHLORIDE CRYS ER 20 MEQ PO TBCR: 20.0000 meq | EXTENDED_RELEASE_TABLET | Freq: Two times a day (BID) | ORAL | Status: DC

## 2014-04-30 MED ORDER — QUINAPRIL HCL 40 MG PO TABS: ORAL_TABLET | ORAL | Status: DC

## 2014-04-30 MED ORDER — PROPRANOLOL HCL 80 MG PO TABS: ORAL_TABLET | ORAL | Status: DC

## 2014-04-30 MED ORDER — HYDROCODONE-HOMATROPINE 5-1.5 MG/5ML PO SYRP: 5.0000 mL | ORAL_SOLUTION | Freq: Four times a day (QID) | ORAL | Status: DC | PRN

## 2014-04-30 MED ORDER — ALLOPURINOL 300 MG PO TABS: 300.0000 mg | ORAL_TABLET | Freq: Every day | ORAL | Status: DC

## 2014-04-30 NOTE — Progress Notes (Signed)
Subjective:    Patient ID: Cheyenne [name], female    DOB: Jan 27, 1926, 80 y.o.   MRN: [id number]  HPI 80 y/o BF here for a follow up visit... she has multiple medical problems as noted below>>>  ~  April 22, 2012:  5mo ROV & Cheyenne [name] has had a stable interval- no new complaints or concerns;  We reviewed the following problems today:    Hypoxemia, PulmHTN> on HomeO2 & Hycodan cough syrup; she feels her breathing is at baseline; last 2DEcho w/ PAsys~50 & stable...    HBP> on ASA81, Propran80Bid, Quinapril40, Lasix40, Diamox250-2/d, & K20; BP= 128/82 & she denies CP, palpit, ch in SOB or edema...    AFib, SSS, Tachy-brady> on Coumadin (via [facility]); followed by DrKlein for Cards/EP...    DM> on diet alone; she is down 8# to 150# today; BS is normal at 97...    GI- Divertics, IBS, Polyps> they received a letter from DrStark 7 daugh wants her to set up appt to see him...    GU- bladder symptoms> sounds like urge/stress incont- trial Oxybutynin10mg  7 refer to Urology...    Hx bilat breast cancers> s/p mastectomies in 1996 & 2002, followed by [facility] & doing well w/o known recurrence...    DJD> on Allopurinol 300, Tramadol50; stable w/ mod DJD & limited mobility (knees, hips)...    Early dementia, anxiety> Aware- family controls her meds 7 helps at home... We reviewed prob list, meds, xrays and labs> see below for updates >>  LABS 11/13:  Chems- ok w/ K=3.7 Co2=34 BUN=23 Creat=0.9;  BNP=470...   ~  Oct 20, 2012:  51mo ROV & Cheyenne [name] has been stable- had some recent dizziness, otherw ok;  We reviewed the following medical problems during today's office visit >>     Hypoxemia, PulmHTN> on HomeO2 & Hycodan cough syrup; she feels her breathing is at baseline; last 2DEcho w/ PAsys~50 & stable...    HBP> on ASA81, Propran80Bid, Quinapril40, Lasix40, Diamox250-2/d, & K20Bid; BP= 136/80 & she denies CP, palpit, ch in SOB or edema...    AFib, SSS, Tachy-brady> on Coumadin (via CC); followed by DrKlein for  Cards/EP...    DM> on diet alone; she is down 6# to 144# today; BS is normal at 102...    GI- Divertics, IBS, Polyps> on Immodium prn; she had appt w/ DrStark 12/13> IBS, hx divertics & colon polyps, he did not rec surveillance colonoscopy for her...    GU- bladder symptoms> sounds like urge/stress incont- trial Oxybutynin10mg  & eval by Urology DrManny> rec changing diuretics if poss but it is not poss...    Hx bilat breast cancers> s/p mastectomies in 1996 & 2002, followed by [facility] & doing well w/o known recurrence...    DJD> on Allopurinol 300, Tramadol50; stable w/ mod DJD & limited mobility (knees, hips)...    Early dementia, anxiety> Aware- family controls her meds & helps at home... We reviewed prob list, meds, xrays and labs> see below for updates >>  CXR 5/14 showed cardiomeg, clear lungs, DJD spine, NAD... LABS 5/14:  Chems- ok x K=3.4;  CBC- ok w/ Hg=12.3;  TSH=1.38;  VitD=31;  BNP=463...  ~  April 28, 2013:  74mo ROV & Cheyenne [name] developed a skin lesion on her ant lower chest wall over the xyphoid & went to the ER 10/29- ?infected cyst ?other etiology; they did an I&D (note says it drained purulent material) & packed w/ iodoform gauze; placed her on DoxyBid & told to f/u here; C&S grew mult  organisms no staph aureus recovered;  Today we removed the iodoform gauze & cleaned the wound, no pus or drainage, can still palp a subcut knot/lesion & there is a mod-arge defect=> rec f/u by [facility] (she has seen [facility] in the past for her breast cancer surg) as this may require excision... We reviewed the following medical problems during today's office visit >>     Hypoxemia, PulmHTN> on HomeO2, ProairHFA prn, & Hycodan cough syrup; she feels her breathing is at baseline; last 2DEcho w/ PAsys~50 & stable...    HBP> on ASA81, Propran80-1/2Bid, Quinapril40, Lasix40, Diamox250-2/d, & K20Bid; BP= 142/80 & she denies CP, palpit, ch in SOB or edema...    AFib, SSS, Tachy-brady> on Coumadin (via [facility]);  followed by DrKlein for Cards/EP...    DM> on diet alone; wt is stable at 143# today; BS has been normal ~100...    GI- Divertics, IBS, Polyps> on Immodium prn; she had appt w/ DrStark 12/13> IBS, hx divertics & colon polyps, he did not rec surveillance colonoscopy for her...    GU- bladder symptoms> she has freq & urge/stress incont- trial Oxybutynin10mg  & eval by Urology DrManny> rec changing diuretics if poss but it is not poss...    Hx bilat breast cancers> s/p mastectomies in 1996 & 2002, followed by [facility] & doing well w/o known recurrence...    DJD> on Allopurinol 300, Tramadol50; stable w/ mod DJD & limited mobility (knees, hips)...    Early dementia, anxiety> Aware- family controls her meds & helps at home... We reviewed prob list, meds, xrays and labs> see below for updates >> ok Flu vaccine today...  ~  Oct 28, 2013:  72mo ROV & Cheyenne [name] reports doing satis overall just some incr edema in ankles assoc w/ 5# wt gain but denies CP, palpit, ch in SOB/DOE, cough, sput, etc...     Hypoxemia, PulmHTN> on HomeO2, ProairHFA prn, & Hycodan cough syrup; she feels her breathing is at baseline; last 2DEcho 5/13 w/ PAsys~50 & stable...    HBP> on ASA81, Propran80-1/2Bid, Quinapril40, Lasix40, Diamox250-2/d, & K20Bid; BP= 144/80 & she denies CP, palpit, ch in SOB, but has developed ankle edema & 5# wt gain...    AFib, SSS, Tachy-brady> on Coumadin (via CC); followed by DrKlein for Cards/EP.Marland KitchenMarland Kitchen    Venous Insuffic & edema> she has incr ankle edema 7 rec to elim sodium, elev legs, wear support hose & continue same diuretics...    DM> on diet alone; wt is up at 147# today; BS has been normal ~100...    GI- Divertics, IBS, Polyps> on Immodium prn; she had appt w/ DrStark 12/13> IBS, hx divertics & colon polyps, he did not rec surveillance colonoscopy for her...    GU- bladder symptoms> she has freq & urge/stress incont- trial Oxybutynin10mg  & eval by Urology DrManny> rec changing diuretics if poss but it is  not poss...    Hx bilat breast cancers> s/p mastectomies in 1996 & 2002, followed by [facility] & doing well w/o known recurrence...    DJD> on Allopurinol 300, Tramadol50; stable w/ mod DJD & limited mobility (knees, hips)...    Early dementia, anxiety> Aware- family helps w/ her meds & helps at home... We reviewed prob list, meds, xrays and labs> see below for updates >>   CXR 5/15 showed mild cardiomeg, clear lungs, DJD spine, surg clips right axilla, NAD...  LABS 5/15:  Chems- ok x K=3.4 on K20/d & asked to incr back to Bid;    Ambulate on RA:  96% O2sat on RA at rest w/ HR=52;  87% ob RA after 1lap w/ HR=109 => continue same O2 therapy...   ~  April 30, 2014:  44mo ROV & Cheyenne [name] is about the same, notes low energy & having to go to the BR a lot; I not that she has gained 10 lbs more (up to 156#) w/ 4+edema in feet, daughter says she is using "sea salt" & we had the conversation (again) about NO SALT- only allowed to use substitute or MrsDash;  She remains on Lasix40Qam & Diamox250-2Qpm along w/ K20Bid> BMet is OK w/ K=4.1, HCO3=33, BUN=16, Cr=0.9; we will increase her Lasix & f/u labs in 6wks... Her breathing is at baseline, on O2 at 2-3L/min flow, and chest is clear w/o wheezing, rales, or rhonchi...  BP remains well controlled on these diuretics plus Inderal40Bid + Lisin40 Qd...  She continues on coumadin via the CC...    We reviewed prob list, meds, xrays and labs> see below for updates >> OK Flu shots today and refills per request...   LABS 11/15:  BMet- ok w/ K=4.1, HCO3=33, BUN=16, Cr=0.9.Marland KitchenMarland Kitchen PLAN>>  In light of her further weight gain & BMet above- rec to increase Lasix40 to 2tabs Qam + keep the Diamox250- 2tabsQpm + keep the K20Bid;  She will need BMet checked again in about 6wks (the fisrst wk of Jan);  Reminder- NO SALT, elev legs, wear support hose...            Problem List:  ALLERGIC RHINITIS (ICD-477.9) - on FLONASE Qhs & ZYRTEK QAM Prn.  Hx of HYPOXEMIA (ICD-799.02) - full  eval for hypoxemia in [id number]-  ~  7/08:  CXR= cardiomegaly, ectatic Ao, pulm ven HTN... CTAngio= neg, without PE, min scarring in lingula... 2DEcho= norm LVF w/ EF=55%, LVwall thickness at upper limits, mild ca++ AoV w/ mild AI,  PA sys pressure = 65mmHg... O2 sats 90-94 at rest on RA... w/ ambulation in the office sats drop to 85%... she didn't yet want to go on oxygen at home... just occas SOB w/ activity- she is too sedentary and needs to incr her exercise program. ~  CTChest 6/09 showed bilat mastectomies, no adenopathy, no fluid, 2 sm nodules 4-66mm size RML/LLL, NAD... ~  6/10: c/o increased DOE & no energy- O2 sat= 91% rest & 86% w/activ... rec> home O2 1L/min rest, 2L/min exercise. ~  4/11 hosp> V/Q scan w/ normal vent & norm perfusion... CXR 4/11 showed cardiomeg, clear lungs, no CHF, surg clips in right axilla... ~  8/11:  pt requesting change to LiqO2 to incr mobility. ~  11/12:  O2 sat= 94% at rest on 2L/min oxygen via [place]... remains stable. ~  11/13:  O2 sat= 100% on 2L/min at rest... ~  CXR 5/14 showed cardiomeg, clear lungs, DJD spine, NAD ~  21/19:  O2 recert today> O2 sat on RA at rest=96% w/ HR=65/min;  Ambulated 3 laps on RA w/ nadir O2 sat=88% w/ HR=116; Rec to continue O2 w/ exercise & Qhs... ~  CXR 5/15 showed mild cardiomeg, clear lungs, DJD spine, surg clips right axilla, NAD.Marland Kitchen. ~  5/15: Ambulate on RA:  96% O2sat on RA at rest w/ HR=52;  87% ob RA after 1lap w/ HR=109 => continue same O2 therapy ~  11/15: she remains on O2 at 2-3L/min flow; O2sats on 3L/min today = 98%...  HYPERTENSION (ICD-401.9) - controlled on PROPRANOLOL 80mg Bid, QUINAPRIL 40mg /d; and back on LASIX 40mg AM, KCL 66mEqAM, & ACETAZOLAMIDE 250mg - 2tabsPM... she knows  to avoid sodium etc... ~  labs 2/09 showed Na143, K3.6, CL101, CO2 36, BUN 15, Cr0.9.Marland KitchenMarland Kitchen BNP was 194... ~  labs 6/09 showed lytes normal x TCO2= 37, & BNP= 512... ~  labs 12/09 showed HCO3- 36,  BNP= 586... rec- same meds, no salt! ~  labs 6/10  showed Na= 146, K= 3.6, CO2= 36, BUN= 14, Creat= 0.9, BNP= 336. ~  labs 9/10 showed Na-147, K=3.7, HCO3=36, BUN=18, Creat=0.9, BNP=484 ~  labs 1/11 showed Na=144, K=3.9, HCO3=35, BUN=17, Creat=0.9, BNP=488 ~  [facility] 4/11 & meds changed by TH> off diuretics & KCl... CXR showed cardiomeg, clear lungs, clips right axilla. ~  labs 10/15/09 showed Na=143, K=4.0, HCO3=37, BUN=12, Creat=0.8, BNP=761... restart Lasix, Diamox, KCl (one each). ~  labs 10/25/09 showed HCO3=44, BNP=588... rec> incr Diamox to 2Qpm... ~  labs 8/11 showed HCO3=39, BNP=481... continue same. ~  Labs 5/12 showed HCO3=35, BNP=373... Improved, continue same Rx. ~  Labs 11/12 showed HCO3=37, BNP=351... Stable continue same meds. ~  5/13: BP= 138/88 & HCO3=39, BUN=17, Creat=0.9, BNP=534... Reminded to take meds daily. ~  11/3: on ASA81, Propran80Bid, Quinapril40, Lasix40, Diamox250-2/d, & K20; BP= 128/82 & she denies CP, palpit, ch in SOB or edema...  ~  5/14: on ASA81, Propran80Bid, Quinapril40, Lasix40, Diamox250-2/d, & K20Bid; BP= 136/80 & she denies CP, palpit, ch in SOB or edema ~  11/14: on ASA81, Propran80-1/2Bid, Quinapril40, Lasix40, Diamox250-2/d, & K20Bid; BP= 142/80 & she remains essentially asymptomatic... ~  5/15: on ASA81, Propran80-1/2Bid, Quinapril40, Lasix40, Diamox250-2/d, & K20Bid; BP= 144/80 & she denies CP, palpit, ch in SOB, but has developed ankle edema & 5# wt gain ~  11/15: on ASA81, Propran80-1/2Bid, Quinapril40, Lasix40, Diamox250-2/d, & K20Bid; BP= 120/80 & she remains minimally symptomatic but minimally active as well...  ATRIAL FIBRILLATION (ICD-427.31) & SICK SINUS/ TACHY-BRADY SYNDROME (ICD-427.81) - new prob 1/10 w/ eval by DrKlein- rate control strategy on Propranolol w/ [facility]. ~  7/08:  2DEcho= norm LVF w/ EF=55%, LVwall thickness at upper limits, mild ca++ AoV w/ mild AI,  PA sys pressure = 77mmHg... ~  EKG 1/10 showed AFib, rate~ 60, NSSTTWA... ~  2DEcho 3/10 showed norm LVF w/  EF= 60% & no regional wall motion abn, mild calcif AoV w/ mild AI, mild LA & RA dil w/ incr thickness of interatrial septum c/w lipomatous hypertrophy, PA sys est at 50... ~  Holter monitor 3/10 = AFib w/ rate 45-97... ~  2DEcho 4/11 in hosp showed norm LVwall thickness & EF=55%, mild RVdil w/ mod decr RV function, mod TR & PAsys~69... ~  2DEcho 5/13 showed normal LVF w/ EF=55%, mild AI & MR, mild LA & RA dilatation, PAsys=49... Stable. ~  Stable on Coumadin & followed by DrKlein...  PULMONARY HYPERTENSION >> Serial 2DEchos showed progressive incr PA sys estimates... But the most recent 2DEcho 5/13 was sl improved (see above). ~  V/Q lung scan 4/11 was neg for PE... ~  2DEcho 5/13 showed normal LVF w/ EF=55%, mild AI & MR, mild LA & RA dilatation, PAsys=49... Stable. ~  She is way too sedentary but not motivated to exercise etc...  VENOUS INSUFFICIENCY (ICD-459.81) - mild persist edema despite the sodium restriction, Lasix and Diamox... reviewed recommendation for No Salt, Elevation, TED's, etc... ~  11/15: she presented w/ 10# further wt gain and 4+edema in feet; BMet was OK & we decided to incr Lasix40 to 2tabsQam, continue other meds 7 recheck labs in 6wks...  DIABETES MELLITUS, BORDERLINE (ICD-790.29) - prev on Metformin  500mg /d (held after 4/11 hosp)... ~  last BS=181, HgA1c=6.4 in 11/08... ~  labs 6/09 showed BS= 101, HgA1c= 6.4 ~  labs 12/09 showed BS= 139,  HgA1c= 6.1 ~  labs 6/10 showed BS= 123, A1c= 6.1 ~  7/10: neg ophthal f/u DrHecker- no retinopathy, no macular edema. ~  labs 9/10 showed BS= 93 ~  labs 1/11 showed BS=146, A1c=5.7 ~  labs 5/11 showed BS= 100 on diet alone... ~  labs 8/11 showed BS= 96 ~  Labs 5/12 showed BS= 113, A1c= 5.8 ~  9/12:  Ophthalmology check by DrHecker- no DM retinopathy... ~  Labs 11/12 showed BS= 104 ~  Labs 5/13 showed BS= 118 ~  Labs 11/13 showed BS= 97 ~  2/14: she had a neg Ophthalmology eval by DrHecker... ~  Labs 5/14 showed BS= 102 on  diet alone...  DIVERTICULOSIS OF COLON, IRRITABLE BOWEL SYNDROME & COLONIC POLYPS (ICD-211.3) ~  colonoscopy was 11/06 by DrStark showing divertics and several polyps (one 24mm adenoma)... f/u 6yrs. ~  colonoscopy 1/10 by DrStark showed divertics (severe in sigmoid), 55mm polyp= tub adenoma... f/u 3 yrs. ~  12/13:  She had GI f/u DrStark> IBS w/ freq loose stools, occas BRB, hx extensive divertics & adenomatous polyps, on coumadin for AFib;   URINARY FREQ&URGENCY >>  ~  11/13: she had a GU eval by DrManny> c/o freq & urgency assoc w/ Lasix, some leakage & managing w/ one pad/d, some better w/ Oxybutynin... ~  5/14: she had f/u DrManny, Urology> urinary freq & urgency; worse w/ Lasix; mild leakage & uses 1 pad/d; Oxybutynin caused dry mouth; situation is acceptable & no further meds rec...  BILAT BREAST CANCERS - resected via mastectomies in 1996 and 2002... followed by [facility] annually & seen 8/12> note reviewed. ~  1/15: she had her yearly f/u by [facility]- his note is reviewed, stable, wound over sternum is resolved...   DEGENERATIVE JOINT DISEASE (ICD-715.90) - w/ hx hyperuricemia on ALLOPURINOL 300mg /d & has TRAMADOL 50mg  prn pain...  Hx of HEADACHE (ICD-784.0) ~  MRI Brain 4/11 showed atrophy & sm vessel dis, degen changes at C1-2 w/ some sp stenosis caused part by ant slip of the C1 ring, incidental part empty sella...  ANXIETY (ICD-300.00) - prev rec to take Alpraz 0,5mg  Prn but she never filled the Rx. ~  Remeron 7.5mg /d started 4/11 hosp & stopped 5/11 due to lethargy, sleepiness...   Past Surgical History  Procedure Laterality Date  . Double mastectomy    . Knee surgery  2004    RIGHT  . Cataract extraction    . Cholecystectomy    . Mastectomy  1996    right breast for breast cancer  . Mastectomy  1/02    left - Dr Lucia Gaskins  . Total knee arthroplasty  6/04    right - Dr Telford Nab  . Breast surgery      Outpatient Encounter Prescriptions as of 04/30/2014  Medication Sig   . acetaZOLAMIDE (DIAMOX) 250 MG tablet TAKE 2 TABLETS DAILY AT 4PM  . albuterol (PROVENTIL HFA;VENTOLIN HFA) 108 (90 BASE) MCG/ACT inhaler Inhale 1-2 puffs into the lungs every 6 (six) hours as needed for wheezing or shortness of breath.  . allopurinol (ZYLOPRIM) 300 MG tablet Take 1 tablet (300 mg total) by mouth daily.  Marland Kitchen aspirin 81 MG tablet Take 81 mg by mouth daily.    . Cholecalciferol (VITAMIN D) 2000 UNITS CAPS Take 1 capsule by mouth daily.  . furosemide (LASIX) 40 MG tablet  TAKE 1 TABLET EVERY DAY  . HYDROcodone-homatropine (HYCODAN) 5-1.5 MG/5ML syrup Take 5 mLs by mouth every 6 (six) hours as needed for cough. TAKE 1 TEASPOON EVERY 4 HOURS AS NEEDED FOR COUGH  . loperamide (IMODIUM) 2 MG capsule Take 2 mg by mouth as needed for diarrhea or loose stools.   . Multiple Vitamin (MULTIVITAMIN) capsule Take 1 capsule by mouth daily.    Marland Kitchen oxybutynin (DITROPAN-XL) 10 MG 24 hr tablet TAKE 1 TABLET (10 MG TOTAL) BY MOUTH DAILY. FOR BLADDER SYMPTOMS  . potassium chloride SA (KLOR-CON M20) 20 MEQ tablet Take 1 tablet (20 mEq total) by mouth 2 (two) times daily.  . propranolol (INDERAL) 80 MG tablet Take 1/2 tablet by mouth two times daily  . quinapril (ACCUPRIL) 40 MG tablet TAKE 1 TABLET (40 MG TOTAL) BY MOUTH DAILY.  Marland Kitchen warfarin (COUMADIN) 5 MG tablet TAKE AS DIRECTED BY COUMADIN CLINIC  . [DISCONTINUED] allopurinol (ZYLOPRIM) 300 MG tablet Take 1 tablet (300 mg total) by mouth daily.  . [DISCONTINUED] HYDROcodone-homatropine (HYCODAN) 5-1.5 MG/5ML syrup Take 5 mLs by mouth every 6 (six) hours as needed for cough. TAKE 1 TEASPOON EVERY 4 HOURS AS NEEDED FOR COUGH  . [DISCONTINUED] potassium chloride SA (KLOR-CON M20) 20 MEQ tablet Take 1 tablet (20 mEq total) by mouth 2 (two) times daily.  . [DISCONTINUED] propranolol (INDERAL) 80 MG tablet Take 1/2 tablet by mouth two times daily  . [DISCONTINUED] quinapril (ACCUPRIL) 40 MG tablet TAKE 1 TABLET (40 MG TOTAL) BY MOUTH DAILY.  . traMADol  (ULTRAM) 50 MG tablet TAKE 1 TABLET BY MOUTH 3 TIMES A DAY AS NEEDED FOR PAIN    Allergies  Allergen Reactions  . Iohexol      Desc: IV History sheet from prior CT states IV Contrast allergy- 13 hour prep given.     Current Medications, Allergies, Past Medical History, Past Surgical History, Family History, and Social History were reviewed in [employer] record.    Review of Systems         See HPI - all other systems neg except as noted...  The patient complains of dyspnea on exertion and peripheral edema.  The patient denies anorexia, fever, weight loss, weight gain, vision loss, decreased hearing, hoarseness, chest pain, syncope, prolonged cough, headaches, hemoptysis, abdominal pain, melena, hematochezia, severe indigestion/heartburn, hematuria, incontinence, muscle weakness, suspicious skin lesions, transient blindness, difficulty walking, depression, unusual weight change, abnormal bleeding, enlarged lymph nodes, and angioedema.     Objective:   Physical Exam      WD, WN, 80 y/o BF in NAD... GENERAL:  Alert & oriented; pleasant & cooperative... HEENT:  [place]/AT, EOM-wnl, PERRLA, EACs-clear, TMs-wnl, NOSE-clear, THROAT-clear & wnl. NECK:  Supple w/ fairROM; no JVD; normal carotid impulses w/o bruits; no thyromegaly or nodules palpated; no lymphadenopathy. CHEST:  Clear to P & A; without wheezes/ rales/ or rhonchi heard... HEART:  irregular rhythm; gr 1/6 SEM without rubs or gallops appreciated... ABDOMEN:  Soft & nontender; normal bowel sounds; no organomegaly or masses detected. EXT: without deformities, mild arthritic changes; no varicose veins/ +venous insuffic/ 4+edema today... NEURO:  CN's intact; no focal neuro deficits... DERM:  There is a rounded lesion left calf c/w ringworm...  RADIOLOGY DATA:  Reviewed in the EPIC EMR & discussed w/ the patient...  LABORATORY DATA:  Reviewed in the EPIC EMR & discussed w/ the patient...   Assessment & Plan:     Ven Insuffic & Edema>  She is already on Lasix40, diamox250-2Qpm, K20Bid;  Chems are ok so we will incr Lasix40 to 2Qam, continue other meds; reminded NO SALT, elevate, TED hose; recheck labs in 6 wks...   HYPOXEMIA & CO2 retention>  Stable on her O2 regularly & w/ diuretic regimen including Acetazolamide, continue same... PULM HYPERTENSION ?Etiology>  We rechecked 2DEcho 5/13 & everything looks stable compared to 15yrs prev, continue same meds for now.  HBP>  Controlled on Propranolol, Quinapril, Lasix, & Diamox; continue same...  AFib>  Stable on rate control strategy, no apparent prob w/ tachy or brady; continue same meds...  DM>  Well maintained on diet Rx...  GI>  Divertics, IBS, Polyps>  Stable, we discussed Simethacone Rx for gas...  GU> trial Oxybutynin10 for bladder symptoms and refer to Urology per daugh request...  Hx bilat breast cancers>  Stable, followed by [facility], no known recurrence... Skin lesion on chest wall resolved w/ Rx from [facility]...  DJD>  Hx hyperuricemia on Allopurinol Rx & no clinical attacks...  Anxiety>  Stable on Prn Alprazolam Rx...   Patient's Medications  New Prescriptions   INCREASE THE LASIX 40mg - to 2 tabs each AM...  Previous Medications   ACETAZOLAMIDE (DIAMOX) 250 MG TABLET    TAKE 2 TABLETS DAILY AT 4PM   ALBUTEROL (PROVENTIL HFA;VENTOLIN HFA) 108 (90 BASE) MCG/ACT INHALER    Inhale 1-2 puffs into the lungs every 6 (six) hours as needed for wheezing or shortness of breath.   ASPIRIN 81 MG TABLET    Take 81 mg by mouth daily.     CHOLECALCIFEROL (VITAMIN D) 2000 UNITS CAPS    Take 1 capsule by mouth daily.   FUROSEMIDE (LASIX) 40 MG TABLET    TAKE 2 TABLETS EVERY DAY in the AM   LOPERAMIDE (IMODIUM) 2 MG CAPSULE    Take 2 mg by mouth as needed for diarrhea or loose stools.    MULTIPLE VITAMIN (MULTIVITAMIN) CAPSULE    Take 1 capsule by mouth daily.     OXYBUTYNIN (DITROPAN-XL) 10 MG 24 HR TABLET    TAKE 1 TABLET (10 MG TOTAL) BY MOUTH  DAILY. FOR BLADDER SYMPTOMS   TRAMADOL (ULTRAM) 50 MG TABLET    TAKE 1 TABLET BY MOUTH 3 TIMES A DAY AS NEEDED FOR PAIN   WARFARIN (COUMADIN) 5 MG TABLET    TAKE AS DIRECTED BY COUMADIN CLINIC  Modified Medications   Modified Medication Previous Medication   ALLOPURINOL (ZYLOPRIM) 300 MG TABLET allopurinol (ZYLOPRIM) 300 MG tablet      Take 1 tablet (300 mg total) by mouth daily.    Take 1 tablet (300 mg total) by mouth daily.   HYDROCODONE-HOMATROPINE (HYCODAN) 5-1.5 MG/5ML SYRUP HYDROcodone-homatropine (HYCODAN) 5-1.5 MG/5ML syrup      Take 5 mLs by mouth every 6 (six) hours as needed for cough. TAKE 1 TEASPOON EVERY 4 HOURS AS NEEDED FOR COUGH    Take 5 mLs by mouth every 6 (six) hours as needed for cough. TAKE 1 TEASPOON EVERY 4 HOURS AS NEEDED FOR COUGH   POTASSIUM CHLORIDE SA (KLOR-CON M20) 20 MEQ TABLET potassium chloride SA (KLOR-CON M20) 20 MEQ tablet      Take 1 tablet (20 mEq total) by mouth 2 (two) times daily.    Take 1 tablet (20 mEq total) by mouth 2 (two) times daily.   PROPRANOLOL (INDERAL) 80 MG TABLET propranolol (INDERAL) 80 MG tablet      Take 1/2 tablet by mouth two times daily    Take 1/2 tablet by mouth two times daily  QUINAPRIL (ACCUPRIL) 40 MG TABLET quinapril (ACCUPRIL) 40 MG tablet      TAKE 1 TABLET (40 MG TOTAL) BY MOUTH DAILY.    TAKE 1 TABLET (40 MG TOTAL) BY MOUTH DAILY.  Discontinued Medications   No medications on file

## 2014-04-30 NOTE — Patient Instructions (Signed)
Today we updated your med list in our EPIC system...    Continue your current medications the same...  We gave you the 2015 Flu vaccine today...  Today we checked your follow up blood work...    We will contact you w/ the results when available...   BE SURE TO ELIMINATE the salt from your diet> NO SALT    Elevate your legs when able...    Wear support hose when up & about...  Call for any questions...  Let's plan a follow up visit in 51mo, sooner if needed for problems.Marland KitchenMarland Kitchen

## 2014-05-03 ENCOUNTER — Telehealth: Payer: Self-pay | Admitting: Pulmonary Disease

## 2014-05-03 NOTE — Telephone Encounter (Signed)
Spoke with pt- reports she did not receive hycodan rx during [place] on Friday. Per Epic, hycodan rx printed on 04/30/2014 Dr. Lenna Gilford, pls advise if ok to re-print this for pt as she states she did not get rx on Friday.   When we call pt back, she will let us know if she wants to pick rx up or would like it mailed.

## 2014-05-03 NOTE — Addendum Note (Signed)
Addended by: Elie Confer on: 05/03/2014 10:42 AM   Modules accepted: Orders, Medications

## 2014-05-04 ENCOUNTER — Other Ambulatory Visit: Payer: Self-pay | Admitting: Pulmonary Disease

## 2014-05-04 MED ORDER — HYDROCODONE-HOMATROPINE 5-1.5 MG/5ML PO SYRP: 5.0000 mL | ORAL_SOLUTION | Freq: Four times a day (QID) | ORAL | Status: DC | PRN

## 2014-05-04 NOTE — Telephone Encounter (Signed)
Pt calling back about the prescript for a cough syrup.Cheyenne [name]

## 2014-05-04 NOTE — Telephone Encounter (Signed)
Called and spoke with pt and she is aware that per SN we can reprint this rx.  i have done this and will put it in the mail to the pt.  Nothing further is needed.

## 2014-05-10 ENCOUNTER — Other Ambulatory Visit: Payer: Self-pay | Admitting: Internal Medicine

## 2014-05-14 ENCOUNTER — Ambulatory Visit (INDEPENDENT_AMBULATORY_CARE_PROVIDER_SITE_OTHER): Payer: Medicare Other

## 2014-05-14 DIAGNOSIS — I4819 Other persistent atrial fibrillation: Secondary | ICD-10-CM

## 2014-05-14 DIAGNOSIS — Z5181 Encounter for therapeutic drug level monitoring: Secondary | ICD-10-CM

## 2014-05-14 DIAGNOSIS — I4891 Unspecified atrial fibrillation: Secondary | ICD-10-CM

## 2014-05-14 DIAGNOSIS — Z7901 Long term (current) use of anticoagulants: Secondary | ICD-10-CM

## 2014-05-14 DIAGNOSIS — I481 Persistent atrial fibrillation: Secondary | ICD-10-CM

## 2014-05-14 LAB — POCT INR: INR: 2.9

## 2014-05-31 ENCOUNTER — Other Ambulatory Visit: Payer: Self-pay | Admitting: Pulmonary Disease

## 2014-06-08 ENCOUNTER — Other Ambulatory Visit: Payer: Self-pay | Admitting: Pulmonary Disease

## 2014-06-14 ENCOUNTER — Ambulatory Visit (INDEPENDENT_AMBULATORY_CARE_PROVIDER_SITE_OTHER): Payer: Medicare Other | Admitting: Pharmacist

## 2014-06-14 DIAGNOSIS — Z5181 Encounter for therapeutic drug level monitoring: Secondary | ICD-10-CM

## 2014-06-14 LAB — POCT INR: INR: 2.5

## 2014-06-30 ENCOUNTER — Other Ambulatory Visit (INDEPENDENT_AMBULATORY_CARE_PROVIDER_SITE_OTHER): Payer: Medicare Other

## 2014-06-30 DIAGNOSIS — I10 Essential (primary) hypertension: Secondary | ICD-10-CM

## 2014-06-30 LAB — BASIC METABOLIC PANEL
BUN: 20 mg/dL (ref 6–23)
CO2: 35 mEq/L — ABNORMAL HIGH (ref 19–32)
CREATININE: 0.92 mg/dL (ref 0.40–1.20)
Calcium: 9.5 mg/dL (ref 8.4–10.5)
Chloride: 104 mEq/L (ref 96–112)
GFR: 74.35 mL/min (ref >60.00)
Glucose, Bld: 97 mg/dL (ref 70–99)
Potassium: 3.6 mEq/L (ref 3.5–5.1)
Sodium: 140 mEq/L (ref 135–145)

## 2014-07-01 ENCOUNTER — Telehealth: Payer: Self-pay | Admitting: Pulmonary Disease

## 2014-07-01 MED ORDER — FUROSEMIDE 40 MG PO TABS: ORAL_TABLET | ORAL | Status: DC

## 2014-07-01 NOTE — Telephone Encounter (Signed)
Spoke with [facility]. They reports pt is stating she takes furosemide 40 BID. Reports SN increased her dose. I do not see where this was increased last OV. Please advise SN thanks

## 2014-07-01 NOTE — Telephone Encounter (Signed)
Per SN---  Lasix 40 mg  2 tablets every morning Diamox 1 tablet at 4 pm Potasium 20 meq  1 po bid  thanks

## 2014-07-01 NOTE — Telephone Encounter (Signed)
RX has been resent in. Nothing further needed

## 2014-07-15 ENCOUNTER — Ambulatory Visit (INDEPENDENT_AMBULATORY_CARE_PROVIDER_SITE_OTHER): Payer: Medicare Other | Admitting: Pharmacist

## 2014-07-15 DIAGNOSIS — Z7901 Long term (current) use of anticoagulants: Secondary | ICD-10-CM

## 2014-07-15 DIAGNOSIS — Z5181 Encounter for therapeutic drug level monitoring: Secondary | ICD-10-CM

## 2014-07-15 DIAGNOSIS — I4891 Unspecified atrial fibrillation: Secondary | ICD-10-CM

## 2014-07-15 LAB — POCT INR: INR: 2.6

## 2014-08-27 ENCOUNTER — Ambulatory Visit (INDEPENDENT_AMBULATORY_CARE_PROVIDER_SITE_OTHER): Payer: Medicare Other | Admitting: *Deleted

## 2014-08-27 DIAGNOSIS — Z5181 Encounter for therapeutic drug level monitoring: Secondary | ICD-10-CM

## 2014-08-27 DIAGNOSIS — I4891 Unspecified atrial fibrillation: Secondary | ICD-10-CM

## 2014-08-27 DIAGNOSIS — Z7901 Long term (current) use of anticoagulants: Secondary | ICD-10-CM

## 2014-08-27 LAB — POCT INR: INR: 2.9

## 2014-09-06 ENCOUNTER — Telehealth: Payer: Self-pay | Admitting: Pulmonary Disease

## 2014-09-06 NOTE — Telephone Encounter (Signed)
Called and spoke to pt. Pt requesting refill of Hycodan. Pt denies any current worsening cough, SOB, CP/tightness, f/c/s. Hycodan last filled in 04/2014. Pt due for f/u in 10/2014.   SN please advise.   Allergies  Allergen Reactions  . Iohexol      Desc: IV History sheet from prior CT states IV Contrast allergy- 13 hour prep given.     Current Outpatient Prescriptions on File Prior to Visit  Medication Sig Dispense Refill  . acetaZOLAMIDE (DIAMOX) 250 MG tablet TAKE 2 TABLETS BY MOUTH DAILY AT 4PM 60 tablet 6  . albuterol (PROVENTIL HFA;VENTOLIN HFA) 108 (90 BASE) MCG/ACT inhaler Inhale 1-2 puffs into the lungs every 6 (six) hours as needed for wheezing or shortness of breath.    . allopurinol (ZYLOPRIM) 300 MG tablet TAKE 1 TABLET BY MOUTH DAILY 30 tablet 5  . aspirin 81 MG tablet Take 81 mg by mouth daily.      . Cholecalciferol (VITAMIN D) 2000 UNITS CAPS Take 1 capsule by mouth daily.    . furosemide (LASIX) 40 MG tablet TAKE 2  TABLET EVERY morning 60 tablet 5  . HYDROcodone-homatropine (HYCODAN) 5-1.5 MG/5ML syrup Take 5 mLs by mouth every 6 (six) hours as needed for cough. 120 mL 0  . loperamide (IMODIUM) 2 MG capsule Take 2 mg by mouth as needed for diarrhea or loose stools.     . Multiple Vitamin (MULTIVITAMIN) capsule Take 1 capsule by mouth daily.      Marland Kitchen oxybutynin (DITROPAN-XL) 10 MG 24 hr tablet TAKE 1 TABLET EVERY DAY FOR BLADDER SPASMS 30 tablet 5  . potassium chloride SA (KLOR-CON M20) 20 MEQ tablet Take 1 tablet (20 mEq total) by mouth 2 (two) times daily. 180 tablet 2  . propranolol (INDERAL) 80 MG tablet Take 1/2 tablet by mouth two times daily 90 tablet 2  . quinapril (ACCUPRIL) 40 MG tablet TAKE 1 TABLET EVERY DAY 30 tablet 11  . traMADol (ULTRAM) 50 MG tablet TAKE 1 TABLET BY MOUTH 3 TIMES A DAY AS NEEDED FOR PAIN 50 tablet 5  . warfarin (COUMADIN) 5 MG tablet TAKE AS DIRECTED BY COUMADIN CLINIC 50 tablet 3   No current facility-administered medications on file prior  to visit.

## 2014-09-07 ENCOUNTER — Other Ambulatory Visit: Payer: Self-pay | Admitting: Pulmonary Disease

## 2014-09-07 MED ORDER — HYDROCODONE-HOMATROPINE 5-1.5 MG/5ML PO SYRP: 5.0000 mL | ORAL_SOLUTION | Freq: Four times a day (QID) | ORAL | Status: DC | PRN

## 2014-09-07 NOTE — Telephone Encounter (Signed)
SN renewed patient's rx for cough syrup. Pt called and notified of refill and was instructed that rx would be at front desk for pickup. Pt voiced understanding. No further action is needed at this time.

## 2014-09-28 ENCOUNTER — Ambulatory Visit (INDEPENDENT_AMBULATORY_CARE_PROVIDER_SITE_OTHER): Payer: Medicare Other | Admitting: *Deleted

## 2014-09-28 DIAGNOSIS — Z7901 Long term (current) use of anticoagulants: Secondary | ICD-10-CM

## 2014-09-28 DIAGNOSIS — I4891 Unspecified atrial fibrillation: Secondary | ICD-10-CM

## 2014-09-28 DIAGNOSIS — Z5181 Encounter for therapeutic drug level monitoring: Secondary | ICD-10-CM

## 2014-09-28 LAB — POCT INR: INR: 2.3

## 2014-10-08 ENCOUNTER — Other Ambulatory Visit: Payer: Self-pay | Admitting: Pulmonary Disease

## 2014-11-02 ENCOUNTER — Other Ambulatory Visit: Payer: Self-pay | Admitting: Pulmonary Disease

## 2014-11-03 ENCOUNTER — Other Ambulatory Visit (INDEPENDENT_AMBULATORY_CARE_PROVIDER_SITE_OTHER): Payer: Medicare Other

## 2014-11-03 ENCOUNTER — Ambulatory Visit (INDEPENDENT_AMBULATORY_CARE_PROVIDER_SITE_OTHER)
Admission: RE | Admit: 2014-11-03 | Discharge: 2014-11-03 | Disposition: A | Discharge status: Home / Self Care | Payer: Medicare Other | Source: Ambulatory Visit | Attending: Pulmonary Disease | Admitting: Pulmonary Disease

## 2014-11-03 ENCOUNTER — Encounter: Payer: Self-pay | Admitting: Pulmonary Disease

## 2014-11-03 ENCOUNTER — Other Ambulatory Visit: Payer: Self-pay | Admitting: Pulmonary Disease

## 2014-11-03 ENCOUNTER — Other Ambulatory Visit: Payer: Self-pay | Admitting: Internal Medicine

## 2014-11-03 ENCOUNTER — Ambulatory Visit (INDEPENDENT_AMBULATORY_CARE_PROVIDER_SITE_OTHER): Payer: Medicare Other | Admitting: Pulmonary Disease

## 2014-11-03 DIAGNOSIS — M150 Primary generalized (osteo)arthritis: Secondary | ICD-10-CM

## 2014-11-03 DIAGNOSIS — M159 Polyosteoarthritis, unspecified: Secondary | ICD-10-CM

## 2014-11-03 DIAGNOSIS — I272 Pulmonary hypertension, unspecified: Secondary | ICD-10-CM

## 2014-11-03 DIAGNOSIS — R7301 Impaired fasting glucose: Secondary | ICD-10-CM

## 2014-11-03 DIAGNOSIS — D126 Benign neoplasm of colon, unspecified: Secondary | ICD-10-CM

## 2014-11-03 DIAGNOSIS — K5730 Diverticulosis of large intestine without perforation or abscess without bleeding: Secondary | ICD-10-CM

## 2014-11-03 DIAGNOSIS — F411 Generalized anxiety disorder: Secondary | ICD-10-CM

## 2014-11-03 DIAGNOSIS — R609 Edema, unspecified: Secondary | ICD-10-CM

## 2014-11-03 DIAGNOSIS — I481 Persistent atrial fibrillation: Secondary | ICD-10-CM | POA: Diagnosis not present

## 2014-11-03 DIAGNOSIS — I270 Primary pulmonary hypertension: Secondary | ICD-10-CM

## 2014-11-03 DIAGNOSIS — I10 Essential (primary) hypertension: Secondary | ICD-10-CM

## 2014-11-03 DIAGNOSIS — I4819 Other persistent atrial fibrillation: Secondary | ICD-10-CM

## 2014-11-03 DIAGNOSIS — I872 Venous insufficiency (chronic) (peripheral): Secondary | ICD-10-CM

## 2014-11-03 LAB — HEPATIC FUNCTION PANEL
ALK PHOS: 65 U/L (ref 39–117)
ALT: 12 U/L (ref 0–35)
AST: 23 U/L (ref 0–37)
Albumin: 3.9 g/dL (ref 3.5–5.2)
BILIRUBIN DIRECT: 0.2 mg/dL (ref 0.0–0.3)
Total Bilirubin: 1 mg/dL (ref 0.2–1.2)
Total Protein: 7.1 g/dL (ref 6.0–8.3)

## 2014-11-03 LAB — CBC WITH DIFFERENTIAL/PLATELET
Basophils Absolute: 0 10*3/uL (ref 0.0–0.1)
Basophils Relative: 0.5 % (ref 0.0–3.0)
EOS PCT: 2.8 % (ref 0.0–5.0)
Eosinophils Absolute: 0.2 10*3/uL (ref 0.0–0.7)
HCT: 37.4 % (ref 36.0–46.0)
Hemoglobin: 12.1 g/dL (ref 12.0–15.0)
Lymphocytes Relative: 13.8 % (ref 12.0–46.0)
Lymphs Abs: 0.9 10*3/uL (ref 0.7–4.0)
MCHC: 32.5 g/dL (ref 30.0–36.0)
MCV: 94.1 fl (ref 78.0–100.0)
MONO ABS: 0.6 10*3/uL (ref 0.1–1.0)
Monocytes Relative: 8.9 % (ref 3.0–12.0)
NEUTROS ABS: 5 10*3/uL (ref 1.4–7.7)
NEUTROS PCT: 74 % (ref 43.0–77.0)
Platelets: 152 10*3/uL (ref 150.0–400.0)
RBC: 3.97 Mil/uL (ref 3.87–5.11)
RDW: 15.2 % (ref 11.5–15.5)
WBC: 6.7 10*3/uL (ref 4.0–10.5)

## 2014-11-03 LAB — IBC PANEL
IRON: 65 ug/dL (ref 42–145)
Saturation Ratios: 21.7 % (ref 20.0–50.0)
TRANSFERRIN: 214 mg/dL (ref 212.0–360.0)

## 2014-11-03 LAB — BASIC METABOLIC PANEL
BUN: 22 mg/dL (ref 6–23)
CHLORIDE: 105 meq/L (ref 96–112)
CO2: 34 mEq/L — ABNORMAL HIGH (ref 19–32)
Calcium: 9.4 mg/dL (ref 8.4–10.5)
Creatinine, Ser: 0.9 mg/dL (ref 0.40–1.20)
GFR: 76.2 mL/min (ref >60.00)
GLUCOSE: 100 mg/dL — AB (ref 70–99)
Potassium: 3.5 mEq/L (ref 3.5–5.1)
Sodium: 142 mEq/L (ref 135–145)

## 2014-11-03 LAB — TSH: TSH: 2.65 u[IU]/mL (ref 0.35–4.50)

## 2014-11-03 LAB — FERRITIN: Ferritin: 112.7 ng/mL (ref 10.0–291.0)

## 2014-11-03 MED ORDER — WARFARIN SODIUM 5 MG PO TABS: ORAL_TABLET | ORAL | Status: DC

## 2014-11-03 NOTE — Progress Notes (Unsigned)
Subjective:    Patient ID: Cheyenne [name], female    DOB: [birth year], 81 y.o.   MRN: [id number]  HPI 81 y/o BF here for a follow up visit... she has multiple medical problems as noted below>>> ~  SEE PREV EPIC NOTES FOR OLDER DATA >>    LABS 11/13:  Chems- ok w/ K=3.7 Co2=34 BUN=23 Creat=0.9;  BNP=470...   CXR 5/14 showed cardiomeg, clear lungs, DJD spine, NAD...  LABS 5/14:  Chems- ok x K=3.4;  CBC- ok w/ Hg=12.3;  TSH=1.38;  VitD=31;  BNP=463...  ~  April 28, 2013:  43mo ROV & Cheyenne [name] developed a skin lesion on her ant lower chest wall over the xyphoid & went to the ER 10/29- ?infected cyst ?other etiology; they did an I&D (note says it drained purulent material) & packed w/ iodoform gauze; placed her on DoxyBid & told to f/u here; C&S grew mult organisms no staph aureus recovered;  Today we removed the iodoform gauze & cleaned the wound, no pus or drainage, can still palp a subcut knot/lesion & there is a mod-arge defect=> rec f/u by [facility] (she has seen [facility] in the past for her breast cancer surg) as this may require excision... We reviewed the following medical problems during today's office visit >>     Hypoxemia, PulmHTN> on HomeO2, ProairHFA prn, & Hycodan cough syrup; she feels her breathing is at baseline; last 2DEcho w/ PAsys~50 & stable...    HBP> on ASA81, Propran80-1/2Bid, Quinapril40, Lasix40, Diamox250-2/d, & K20Bid; BP= 142/80 & she denies CP, palpit, ch in SOB or edema...    AFib, SSS, Tachy-brady> on Coumadin (via CC); followed by DrKlein for Cards/EP...    DM> on diet alone; wt is stable at 143# today; BS has been normal ~100...    GI- Divertics, IBS, Polyps> on Immodium prn; she had appt w/ DrStark 12/13> IBS, hx divertics & colon polyps, he did not rec surveillance colonoscopy for her...    GU- bladder symptoms> she has freq & urge/stress incont- trial Oxybutynin10mg  & eval by Urology DrManny> rec changing diuretics if poss but it is not poss...    Hx bilat breast  cancers> s/p mastectomies in 1996 & 2002, followed by [facility] & doing well w/o known recurrence...    DJD> on Allopurinol 300, Tramadol50; stable w/ mod DJD & limited mobility (knees, hips)...    Early dementia, anxiety> Aware- family controls her meds & helps at home... We reviewed prob list, meds, xrays and labs> see below for updates >> ok Flu vaccine today...  ~  Oct 28, 2013:  75mo ROV & Cheyenne [name] reports doing satis overall just some incr edema in ankles assoc w/ 5# wt gain but denies CP, palpit, ch in SOB/DOE, cough, sput, etc...     Hypoxemia, PulmHTN> on HomeO2, ProairHFA prn, & Hycodan cough syrup; she feels her breathing is at baseline; last 2DEcho 5/13 w/ PAsys~50 & stable...    HBP> on ASA81, Propran80-1/2Bid, Quinapril40, Lasix40, Diamox250-2/d, & K20Bid; BP= 144/80 & she denies CP, palpit, ch in SOB, but has developed ankle edema & 5# wt gain...    AFib, SSS, Tachy-brady> on Coumadin (via CC); followed by DrKlein for Cards/EP.Marland KitchenMarland Kitchen    Venous Insuffic & edema> she has incr ankle edema 7 rec to elim sodium, elev legs, wear support hose & continue same diuretics...    DM> on diet alone; wt is up at 147# today; BS has been normal ~100...    GI- Divertics, IBS, Polyps> on Immodium prn; she  had appt w/ DrStark 12/13> IBS, hx divertics & colon polyps, he did not rec surveillance colonoscopy for her...    GU- bladder symptoms> she has freq & urge/stress incont- trial Oxybutynin10mg  & eval by Urology DrManny> rec changing diuretics if poss but it is not poss...    Hx bilat breast cancers> s/p mastectomies in 1996 & 2002, followed by [facility] & doing well w/o known recurrence...    DJD> on Allopurinol 300, Tramadol50; stable w/ mod DJD & limited mobility (knees, hips)...    Early dementia, anxiety> Aware- family helps w/ her meds & helps at home... We reviewed prob list, meds, xrays and labs> see below for updates >>   CXR 5/15 showed mild cardiomeg, clear lungs, DJD spine, surg clips right  axilla, NAD...  LABS 5/15:  Chems- ok x K=3.4 on K20/d & asked to incr back to Bid;    Ambulate on RA:  96% O2sat on RA at rest w/ HR=52;  87% ob RA after 1lap w/ HR=109 => continue same O2 therapy...   ~  April 30, 2014:  56mo ROV & Cheyenne [name] is about the same, notes low energy & having to go to the BR a lot; I not that she has gained 10 lbs more (up to 156#) w/ 4+edema in feet, daughter says she is using "sea salt" & we had the conversation (again) about NO SALT- only allowed to use substitute or MrsDash;  She remains on Lasix40Qam & Diamox250-2Qpm along w/ K20Bid> BMet is OK w/ K=4.1, HCO3=33, BUN=16, Cr=0.9; we will increase her Lasix & f/u labs in 6wks... Her breathing is at baseline, on O2 at 2-3L/min flow, and chest is clear w/o wheezing, rales, or rhonchi...  BP remains well controlled on these diuretics plus Inderal40Bid + Lisin40 Qd...  She continues on coumadin via the CC...    We reviewed prob list, meds, xrays and labs> see below for updates >> OK Flu shots today and refills per request...   LABS 11/15:  BMet- ok w/ K=4.1, HCO3=33, BUN=16, Cr=0.9.Marland KitchenMarland Kitchen PLAN>>  In light of her further weight gain & BMet above- rec to increase Lasix40 to 2tabs Qam + keep the Diamox250- 2tabsQpm + keep the K20Bid;  She will need BMet checked again in about 6wks (the fisrst wk of Jan);  Reminder- NO SALT, elev legs, wear support hose...   ~  Nov 03, 2014:  63mo ROV & Cheyenne [name] has had a stable interval- CC is itching on her back, no rash, no new meds etc; states her breathing is ok, DOE w/o change, no CP, min cough & Hycodan prn helps; weight is down 5# to 151# & less edema, reminded to elim all salt from her diet... We reviewed the following medical problems during today's office visit >>     Hypoxemia, PulmHTN> she has HomeO2 (not using it now), ProairHFA prn, & Hycodan cough syrup; she feels her breathing is at baseline; last 2DEcho 5/13 w/ PAsys~50 & stable...    HBP> on ASA81, Propran80-1/2Bid, Quinapril40,  Lasix40-2Qam, Diamox250-2Qpm, & K20Bid; BP= 118/70 & she denies CP, palpit, ch in SOB, & decr edema w/ 5# wt loss.    AFib, SSS, Tachy-brady> on Coumadin (via [facility]); followed by DrKlein for Cards/EP.Marland KitchenMarland Kitchen    Venous Insuffic & edema> she had incr ankle edema & improved w/ elim sodium, elev legs, support hose & continue same diuretics...    DM> on diet alone; wt is down to 151# today; BS has been normal ~100...    GI- Divertics,  IBS, Polyps> on Immodium prn; she saw DrStark 12/13> IBS, hx divertics & colon polyps, he did not rec surveillance colonoscopy for her...    GU- bladder symptoms> she has freq & urge/stress incont- trial Oxybutynin10mg  & eval by Urology DrManny> rec changing diuretics if poss but it is not poss...    Hx bilat breast cancers> s/p mastectomies in 1996 & 2002, followed by [facility] & doing well w/o known recurrence...    DJD> on Allopurinol 300, Tramadol50; stable w/ mod DJD & limited mobility (knees, hips)...    Early dementia, anxiety> Aware- family helps w/ her meds & helps at home... We reviewed prob list, meds, xrays and labs> see below for updates >> she is due for PREVNAR-13 shot today...  CXR 5/16 showed mild cardiomeg, clear lungs, no effusion, NAD...   LABS 5/16>  Chems- wnl w/ HCO3=34, Cr=0.90;  CBC- wnl w/ Hg=12.1, Fe=65 (22%sat), Ferritin=113;  TSH=2.65...   IMP/PLAN >> Cheyenne [name] is reasonably stable at age 40- given Prevnar-33 today, meds refilled by request, same meds...             Problem List:  ALLERGIC RHINITIS (ICD-477.9) - on FLONASE Qhs & ZYRTEK QAM Prn.  Hx of HYPOXEMIA (ICD-799.02) - full eval for hypoxemia in [id number]-  ~  7/08:  CXR= cardiomegaly, ectatic Ao, pulm ven HTN... CTAngio= neg, without PE, min scarring in lingula... 2DEcho= norm LVF w/ EF=55%, LVwall thickness at upper limits, mild ca++ AoV w/ mild AI,  PA sys pressure = 5mmHg... O2 sats 90-94 at rest on RA... w/ ambulation in the office sats drop to 85%... she didn't yet want to go on  oxygen at home... just occas SOB w/ activity- she is too sedentary and needs to incr her exercise program. ~  CTChest 6/09 showed bilat mastectomies, no adenopathy, no fluid, 2 sm nodules 4-75mm size RML/LLL, NAD... ~  6/10: c/o increased DOE & no energy- O2 sat= 91% rest & 86% w/activ... rec> home O2 1L/min rest, 2L/min exercise. ~  4/11 hosp> V/Q scan w/ normal vent & norm perfusion... CXR 4/11 showed cardiomeg, clear lungs, no CHF, surg clips in right axilla... ~  8/11:  pt requesting change to LiqO2 to incr mobility. ~  11/12:  O2 sat= 94% at rest on 2L/min oxygen via [place]... remains stable. ~  11/13:  O2 sat= 100% on 2L/min at rest... ~  CXR 5/14 showed cardiomeg, clear lungs, DJD spine, NAD ~  62/70:  O2 recert today> O2 sat on RA at rest=96% w/ HR=65/min;  Ambulated 3 laps on RA w/ nadir O2 sat=88% w/ HR=116; Rec to continue O2 w/ exercise & Qhs... ~  CXR 5/15 showed mild cardiomeg, clear lungs, DJD spine, surg clips right axilla, NAD.Marland Kitchen. ~  5/15: Ambulate on RA:  96% O2sat on RA at rest w/ HR=52;  87% ob RA after 1lap w/ HR=109 => continue same O2 therapy ~  11/15: she remains on O2 at 2-3L/min flow; O2sats on 3L/min today = 98%... ~  5/16: she has port O2 at home but states not using it now; O2sat=94% on RA at rest today...   HYPERTENSION (ICD-401.9) - controlled on PROPRANOLOL 80mg Bid, QUINAPRIL 40mg /d; and back on LASIX 40mg AM, KCL 56mEqAM, & ACETAZOLAMIDE 250mg - 2tabsPM... she knows to avoid sodium etc... ~  labs 2/09 showed Na143, K3.6, CL101, CO2 36, BUN 15, Cr0.9.Marland KitchenMarland Kitchen BNP was 194... ~  labs 6/09 showed lytes normal x TCO2= 37, & BNP= 512... ~  labs 12/09 showed HCO3- 36,  BNP= 586.Marland KitchenMarland Kitchen  rec- same meds, no salt! ~  labs 6/10 showed Na= 146, K= 3.6, CO2= 36, BUN= 14, Creat= 0.9, BNP= 336. ~  labs 9/10 showed Na-147, K=3.7, HCO3=36, BUN=18, Creat=0.9, BNP=484 ~  labs 1/11 showed Na=144, K=3.9, HCO3=35, BUN=17, Creat=0.9, BNP=488 ~  [facility] 4/11 & meds changed by TH> off diuretics & KCl... CXR  showed cardiomeg, clear lungs, clips right axilla. ~  labs 10/15/09 showed Na=143, K=4.0, HCO3=37, BUN=12, Creat=0.8, BNP=761... restart Lasix, Diamox, KCl (one each). ~  labs 10/25/09 showed HCO3=44, BNP=588... rec> incr Diamox to 2Qpm... ~  labs 8/11 showed HCO3=39, BNP=481... continue same. ~  Labs 5/12 showed HCO3=35, BNP=373... Improved, continue same Rx. ~  Labs 11/12 showed HCO3=37, BNP=351... Stable continue same meds. ~  5/13: BP= 138/88 & HCO3=39, BUN=17, Creat=0.9, BNP=534... Reminded to take meds daily. ~  11/3: on ASA81, Propran80Bid, Quinapril40, Lasix40, Diamox250-2/d, & K20; BP= 128/82 & she denies CP, palpit, ch in SOB or edema...  ~  5/14: on ASA81, Propran80Bid, Quinapril40, Lasix40, Diamox250-2/d, & K20Bid; BP= 136/80 & she denies CP, palpit, ch in SOB or edema ~  11/14: on ASA81, Propran80-1/2Bid, Quinapril40, Lasix40, Diamox250-2/d, & K20Bid; BP= 142/80 & she remains essentially asymptomatic... ~  5/15: on ASA81, Propran80-1/2Bid, Quinapril40, Lasix40, Diamox250-2/d, & K20Bid; BP= 144/80 & she denies CP, palpit, ch in SOB, but has developed ankle edema & 5# wt gain ~  11/15: on ASA81, Propran80-1/2Bid, Quinapril40, Lasix40, Diamox250-2/d, & K20Bid; BP= 120/80 & she remains minimally symptomatic but minimally active as well... ~  5/16: on ASA81, Propran80-1/2Bid, Quinapril40, Lasix40-2Qam, Diamox250-2Qpm, & K20Bid; BP= 118/70 & she denies CP, palpit, ch in SOB, & decr edema w/ 5# wt loss.  ATRIAL FIBRILLATION (ICD-427.31) & SICK SINUS/ TACHY-BRADY SYNDROME (ICD-427.81) - new prob 1/10 w/ eval by DrKlein- rate control strategy on Propranolol w/ [facility]. ~  7/08:  2DEcho= norm LVF w/ EF=55%, LVwall thickness at upper limits, mild ca++ AoV w/ mild AI,  PA sys pressure = 55mmHg... ~  EKG 1/10 showed AFib, rate~ 60, NSSTTWA... ~  2DEcho 3/10 showed norm LVF w/ EF= 60% & no regional wall motion abn, mild calcif AoV w/ mild AI, mild LA & RA dil w/ incr thickness of  interatrial septum c/w lipomatous hypertrophy, PA sys est at 50... ~  Holter monitor 3/10 = AFib w/ rate 45-97... ~  2DEcho 4/11 in hosp showed norm LVwall thickness & EF=55%, mild RVdil w/ mod decr RV function, mod TR & PAsys~69... ~  2DEcho 5/13 showed normal LVF w/ EF=55%, mild AI & MR, mild LA & RA dilatation, PAsys=49... Stable. ~  Stable on Coumadin & followed by DrKlein...  PULMONARY HYPERTENSION >> Serial 2DEchos showed progressive incr PA sys estimates... But the most recent 2DEcho 5/13 was sl improved (see above). ~  V/Q lung scan 4/11 was neg for PE... ~  2DEcho 5/13 showed normal LVF w/ EF=55%, mild AI & MR, mild LA & RA dilatation, PAsys=49... Stable. ~  She is way too sedentary but not motivated to exercise etc...  VENOUS INSUFFICIENCY (ICD-459.81) - mild persist edema despite the sodium restriction, Lasix and Diamox... reviewed recommendation for No Salt, Elevation, TED's, etc... ~  11/15: she presented w/ 10# further wt gain and 4+edema in feet; BMet was OK & we decided to incr Lasix40 to 2tabsQam, continue other meds 7 recheck labs in 6wks... ~  6/15: she had incr ankle edema & improved w/ elim sodium, elev legs, support hose & continue same diuretics  DIABETES MELLITUS,  BORDERLINE (ICD-790.29) - prev on Metformin 500mg /d (held after 4/11 hosp)... ~  last BS=181, HgA1c=6.4 in 11/08... ~  labs 6/09 showed BS= 101, HgA1c= 6.4 ~  labs 12/09 showed BS= 139,  HgA1c= 6.1 ~  labs 6/10 showed BS= 123, A1c= 6.1 ~  7/10: neg ophthal f/u DrHecker- no retinopathy, no macular edema. ~  labs 9/10 showed BS= 93 ~  labs 1/11 showed BS=146, A1c=5.7 ~  labs 5/11 showed BS= 100 on diet alone... ~  labs 8/11 showed BS= 96 ~  Labs 5/12 showed BS= 113, A1c= 5.8 ~  9/12:  Ophthalmology check by DrHecker- no DM retinopathy... ~  Labs 11/12 showed BS= 104 ~  Labs 5/13 showed BS= 118 ~  Labs 11/13 showed BS= 97 ~  2/14: she had a neg Ophthalmology eval by DrHecker... ~  Labs 5/14 showed BS= 102  on diet alone... ~  On diet alone, BS ~100 and stable...  DIVERTICULOSIS OF COLON, IRRITABLE BOWEL SYNDROME & COLONIC POLYPS (ICD-211.3) ~  colonoscopy was 11/06 by DrStark showing divertics and several polyps (one 43mm adenoma)... f/u 26yrs. ~  colonoscopy 1/10 by DrStark showed divertics (severe in sigmoid), 31mm polyp= tub adenoma... f/u 3 yrs. ~  12/13:  She had GI f/u DrStark> IBS w/ freq loose stools, occas BRB, hx extensive divertics & adenomatous polyps, on coumadin for AFib;   URINARY FREQ&URGENCY >>  ~  11/13: she had a GU eval by DrManny> c/o freq & urgency assoc w/ Lasix, some leakage & managing w/ one pad/d, some better w/ Oxybutynin... ~  5/14: she had f/u DrManny, Urology> urinary freq & urgency; worse w/ Lasix; mild leakage & uses 1 pad/d; Oxybutynin caused dry mouth; situation is acceptable & no further meds rec...  BILAT BREAST CANCERS - resected via mastectomies in 1996 and 2002... followed by [facility] annually & seen 8/12> note reviewed. ~  1/15: she had her yearly f/u by [facility]- his note is reviewed, stable, wound over sternum is resolved...   DEGENERATIVE JOINT DISEASE (ICD-715.90) - w/ hx hyperuricemia on ALLOPURINOL 300mg /d & has TRAMADOL 50mg  prn pain...  Hx of HEADACHE (ICD-784.0) ~  MRI Brain 4/11 showed atrophy & sm vessel dis, degen changes at C1-2 w/ some sp stenosis caused part by ant slip of the C1 ring, incidental part empty sella...  ANXIETY (ICD-300.00) - prev rec to take Alpraz 0,5mg  Prn but she never filled the Rx. ~  Remeron 7.5mg /d started 4/11 hosp & stopped 5/11 due to lethargy, sleepiness...   Past Surgical History  Procedure Laterality Date  . Double mastectomy    . Knee surgery  2004    RIGHT  . Cataract extraction    . Cholecystectomy    . Mastectomy  1996    right breast for breast cancer  . Mastectomy  1/02    left - Dr Lucia Gaskins  . Total knee arthroplasty  6/04    right - Dr Telford Nab  . Breast surgery      Outpatient Encounter  Prescriptions as of 11/03/2014  Medication Sig  . acetaZOLAMIDE (DIAMOX) 250 MG tablet TAKE 2 TABLETS BY MOUTH DAILY AT 4PM  . albuterol (PROVENTIL HFA;VENTOLIN HFA) 108 (90 BASE) MCG/ACT inhaler Inhale 1-2 puffs into the lungs every 6 (six) hours as needed for wheezing or shortness of breath.  . allopurinol (ZYLOPRIM) 300 MG tablet TAKE 1 TABLET BY MOUTH DAILY  . aspirin 81 MG tablet Take 81 mg by mouth daily.    . Cholecalciferol (VITAMIN D) 2000 UNITS  CAPS Take 1 capsule by mouth daily.  . furosemide (LASIX) 40 MG tablet TAKE 2  TABLET EVERY morning  . HYDROcodone-homatropine (HYCODAN) 5-1.5 MG/5ML syrup Take 5 mLs by mouth every 6 (six) hours as needed for cough. (Patient not taking: Reported on 11/03/2014)  . loperamide (IMODIUM) 2 MG capsule Take 2 mg by mouth as needed for diarrhea or loose stools.   . Multiple Vitamin (MULTIVITAMIN) capsule Take 1 capsule by mouth daily.    Marland Kitchen oxybutynin (DITROPAN-XL) 10 MG 24 hr tablet TAKE 1 TABLET EVERY DAY FOR BLADDER SPASMS  . potassium chloride SA (KLOR-CON M20) 20 MEQ tablet Take 1 tablet (20 mEq total) by mouth 2 (two) times daily.  . propranolol (INDERAL) 80 MG tablet Take 1/2 tablet by mouth two times daily  . quinapril (ACCUPRIL) 40 MG tablet TAKE 1 TABLET EVERY DAY  . traMADol (ULTRAM) 50 MG tablet TAKE 1 TABLET BY MOUTH 3 TIMES A DAY AS NEEDED FOR PAIN  . warfarin (COUMADIN) 5 MG tablet TAKE AS DIRECTED BY COUMADIN CLINIC   No facility-administered encounter medications on file as of 11/03/2014.    Allergies  Allergen Reactions  . Iohexol      Desc: IV History sheet from prior CT states IV Contrast allergy- 13 hour prep given.     Immunization History  Administered Date(s) Administered  . H1N1 06/07/2008  . Influenza Split 04/24/2011, 04/22/2012  . Influenza Whole 03/08/2009, 06/26/2010  . Influenza,inj,Quad PF,36+ Mos 04/28/2013, 04/30/2014  . Pneumococcal Polysaccharide-23 10/10/2009    Current Medications, Allergies, Past  Medical History, Past Surgical History, Family History, and Social History were reviewed in [employer] record.    Review of Systems         See HPI - all other systems neg except as noted...  The patient complains of dyspnea on exertion and peripheral edema.  The patient denies anorexia, fever, weight loss, weight gain, vision loss, decreased hearing, hoarseness, chest pain, syncope, prolonged cough, headaches, hemoptysis, abdominal pain, melena, hematochezia, severe indigestion/heartburn, hematuria, incontinence, muscle weakness, suspicious skin lesions, transient blindness, difficulty walking, depression, unusual weight change, abnormal bleeding, enlarged lymph nodes, and angioedema.     Objective:   Physical Exam      WD, WN, 81 y/o BF in NAD... GENERAL:  Alert & oriented; pleasant & cooperative... HEENT:  [place]/AT, EOM-wnl, PERRLA, EACs-clear, TMs-wnl, NOSE-clear, THROAT-clear & wnl. NECK:  Supple w/ fairROM; no JVD; normal carotid impulses w/o bruits; no thyromegaly or nodules palpated; no lymphadenopathy. CHEST:  Clear to P & A; without wheezes/ rales/ or rhonchi heard... HEART:  irregular rhythm; gr 1/6 SEM without rubs or gallops appreciated... ABDOMEN:  Soft & nontender; normal bowel sounds; no organomegaly or masses detected. EXT: without deformities, mild arthritic changes; no varicose veins/ +venous insuffic/ 2+edema today... NEURO:  CN's intact; no focal neuro deficits... DERM:  There is a rounded lesion left calf c/w ringworm...  RADIOLOGY DATA:  Reviewed in the EPIC EMR & discussed w/ the patient...  LABORATORY DATA:  Reviewed in the EPIC EMR & discussed w/ the patient...   Assessment & Plan:    Ven Insuffic & Edema>  She is on Lasix40-2Qam, Diamox250-2Qpm, K20Bid; Chems are ok & reminded NO SALT, elevate, TED hose etc...   HYPOXEMIA & CO2 retention>  Stable on her O2 (but only using it prn) & w/ diuretic regimen including Acetazolamide, continue  same... PULM HYPERTENSION ?Etiology>  We rechecked 2DEcho 5/13 & everything looks stable at age 24 compared to 75yrs prev,  continue same meds for now.  HBP>  Controlled on Propranolol, Quinapril, Lasix, & Diamox; continue same...  AFib>  Stable on rate control strategy, no apparent prob w/ tachy or brady; continue same meds...  DM>  Well maintained on diet Rx...  GI>  Divertics, IBS, Polyps>  Stable, we discussed Simethacone Rx for gas...  GU> trial Oxybutynin10 for bladder symptoms and refer to Urology per daugh request...  Hx bilat breast cancers>  Stable, followed by [facility], no known recurrence... Skin lesion on chest wall resolved w/ Rx [facility]...  DJD>  Hx hyperuricemia on Allopurinol Rx & no clinical attacks...  Anxiety>  Stable on Prn Alprazolam Rx...   Patient's Medications  New Prescriptions   No medications on file  Previous Medications   ACETAZOLAMIDE (DIAMOX) 250 MG TABLET    TAKE 2 TABLETS BY MOUTH DAILY AT 4PM   ALBUTEROL (PROVENTIL HFA;VENTOLIN HFA) 108 (90 BASE) MCG/ACT INHALER    Inhale 1-2 puffs into the lungs every 6 (six) hours as needed for wheezing or shortness of breath.   ALLOPURINOL (ZYLOPRIM) 300 MG TABLET    TAKE 1 TABLET BY MOUTH DAILY   ASPIRIN 81 MG TABLET    Take 81 mg by mouth daily.     CHOLECALCIFEROL (VITAMIN D) 2000 UNITS CAPS    Take 1 capsule by mouth daily.   FUROSEMIDE (LASIX) 40 MG TABLET    TAKE 2  TABLET EVERY morning   HYDROCODONE-HOMATROPINE (HYCODAN) 5-1.5 MG/5ML SYRUP    Take 5 mLs by mouth every 6 (six) hours as needed for cough.   LOPERAMIDE (IMODIUM) 2 MG CAPSULE    Take 2 mg by mouth as needed for diarrhea or loose stools.    MULTIPLE VITAMIN (MULTIVITAMIN) CAPSULE    Take 1 capsule by mouth daily.     OXYBUTYNIN (DITROPAN-XL) 10 MG 24 HR TABLET    TAKE 1 TABLET EVERY DAY FOR BLADDER SPASMS   POTASSIUM CHLORIDE SA (KLOR-CON M20) 20 MEQ TABLET    Take 1 tablet (20 mEq total) by mouth 2 (two) times daily.   PROPRANOLOL  (INDERAL) 80 MG TABLET    Take 1/2 tablet by mouth two times daily   QUINAPRIL (ACCUPRIL) 40 MG TABLET    TAKE 1 TABLET EVERY DAY   TRAMADOL (ULTRAM) 50 MG TABLET    TAKE 1 TABLET BY MOUTH 3 TIMES A DAY AS NEEDED FOR PAIN  Modified Medications   Modified Medication Previous Medication   WARFARIN (COUMADIN) 5 MG TABLET warfarin (COUMADIN) 5 MG tablet      TAKE AS DIRECTED BY COUMADIN CLINIC    TAKE AS DIRECTED BY COUMADIN CLINIC  Discontinued Medications   No medications on file

## 2014-11-03 NOTE — Patient Instructions (Signed)
Today we updated your med list in our EPIC system...    Continue your current medications the same...  Today we checked your follow up CXR & blood work...    We will contact you w/ the results when available...   Be sure to eliminate the salt/ sodium from your diet...  keep your legs elevated, and where support hose when up and about...  Call for any questions...  Let's plan a follow up visit in 35mo, sooner if needed for problems.Marland KitchenMarland Kitchen

## 2014-11-10 ENCOUNTER — Ambulatory Visit (INDEPENDENT_AMBULATORY_CARE_PROVIDER_SITE_OTHER): Payer: Medicare Other | Admitting: *Deleted

## 2014-11-10 DIAGNOSIS — Z5181 Encounter for therapeutic drug level monitoring: Secondary | ICD-10-CM | POA: Diagnosis not present

## 2014-11-10 DIAGNOSIS — I4891 Unspecified atrial fibrillation: Secondary | ICD-10-CM

## 2014-11-10 DIAGNOSIS — I4819 Other persistent atrial fibrillation: Secondary | ICD-10-CM

## 2014-11-10 DIAGNOSIS — I481 Persistent atrial fibrillation: Secondary | ICD-10-CM

## 2014-11-10 DIAGNOSIS — Z7901 Long term (current) use of anticoagulants: Secondary | ICD-10-CM

## 2014-11-10 LAB — POCT INR: INR: 2.8

## 2014-11-19 ENCOUNTER — Other Ambulatory Visit: Payer: Self-pay | Admitting: Pulmonary Disease

## 2014-11-29 ENCOUNTER — Telehealth: Payer: Self-pay | Admitting: Pulmonary Disease

## 2014-11-29 DIAGNOSIS — M159 Polyosteoarthritis, unspecified: Secondary | ICD-10-CM

## 2014-11-29 DIAGNOSIS — M150 Primary generalized (osteo)arthritis: Principal | ICD-10-CM

## 2014-11-29 NOTE — Telephone Encounter (Signed)
Pt c/o pain in neck - achy feeling. Pain worse in right side when trying to turn head.  Pt states that turning her head has gotten a little easier, not as much pain but she still has constant pain in her whole neck. Pt has been alternating ice and heat Used Deddrick Saindon alcohol on a warm cloth and vicks vapor rib on warm cloth.  Pt not getting any better.   Please advise Dr Lenna Gilford of any further rec's

## 2014-11-29 NOTE — Telephone Encounter (Signed)
Pt is aware of referral. Nothing further was needed.

## 2014-11-29 NOTE — Telephone Encounter (Signed)
Per SN, pt needs evaluation of neck pain. Order was placed for referral with Dr Gardenia Phlegm for eval of neck pain. Please notify pt.

## 2014-12-09 ENCOUNTER — Ambulatory Visit: Payer: Medicare Other | Admitting: Family Medicine

## 2014-12-10 ENCOUNTER — Encounter: Payer: Self-pay | Admitting: Family Medicine

## 2014-12-10 ENCOUNTER — Ambulatory Visit (INDEPENDENT_AMBULATORY_CARE_PROVIDER_SITE_OTHER): Payer: Medicare Other | Admitting: Family Medicine

## 2014-12-10 VITALS — BP 114/76 | HR 69 | Ht 63.0 in | Wt 147.0 lb

## 2014-12-10 DIAGNOSIS — M5030 Other cervical disc degeneration, unspecified cervical region: Secondary | ICD-10-CM

## 2014-12-10 NOTE — Patient Instructions (Addendum)
Good to see you.  Ice 20 minutes 2 times daily. Usually after activity and before bed. Ok to do heat before activity but always ice after a lot of activity Message with a tennis ball if the pain comes back pennsaid pinkie amount topically 2 times daily as needed.  Tylenol 500mg  3 times a day is best medicine for arthritis.  Vitamin D 2000 IU daily See me again when you need me.

## 2014-12-10 NOTE — Progress Notes (Signed)
[facility] [place], [place] [address] Phone: [phone] Subjective:    I'm seeing this patient by the request  of:  NADEL,SCOTT M, MD   CC: Neck pain  [id number] Cheyenne [name] is a 81 y.o. female coming in with complaint of neck pain. Patient was having severe neck pain approximately 2 weeks ago. States that on and off for 2 week she started having tightness which she was unable to actually move her neck one side or the other. Patient denies any radiation down the hands or any numbness or tingling. States that it seemed to get better with home modalities including icing and heat. Patient is not taking any medication. Patient states that over the course of time and switching pillows seem to be more beneficial. Patient states that the discomfort is only 2 out of 10. States that she is near her baseline. Patient would like to know what she can do possibly if she ever has this happen again.    past imaging with an MRI of the brain in 2011 was reviewed by me showing spinal stenosis at C1-C2 and the moderate amount. Patient otherwise has not had any other x-rays of the cervical spine.  Past Medical History  Diagnosis Date  . Allergic rhinitis   . Hypoxemia   . HTN (hypertension)   . Atrial fibrillation   . Sick sinus syndrome with tachycardia   . Venous insufficiency   . Borderline diabetes mellitus   . IBS (irritable bowel syndrome)   . Tubulovillous adenoma of colon 2006  . Adenocarcinoma, breast     bilateral  . DJD (degenerative joint disease)   . Headache(784.0)   . Anxiety   . Diverticulosis    Past Surgical History  Procedure Laterality Date  . Double mastectomy    . Knee surgery  2004    RIGHT  . Cataract extraction    . Cholecystectomy    . Mastectomy  1996    right breast for breast cancer  . Mastectomy  1/02    left - Dr Lucia Gaskins  . Total knee arthroplasty  6/04    right - Dr Telford Nab  . Breast surgery     History   Substance Use Topics  . Smoking status: Never Smoker   . Smokeless tobacco: Never Used  . Alcohol Use: No   Allergies  Allergen Reactions  . Iohexol      Desc: IV History sheet from prior CT states IV Contrast allergy- 13 hour prep given.    Family History  Problem Relation Age of Onset  . Kidney cancer Sister   . Cancer Sister     breast  . Heart attack Mother   . Ulcers Father   . Heart disease Brother     CABG  . Asthma Brother   . Breast cancer Daughter   . Cancer Daughter     breast     Past medical history, social, surgical and family history all reviewed in electronic medical record.   Review of Systems: No headache, visual changes, nausea, vomiting, diarrhea, constipation, dizziness, abdominal pain, skin rash, fevers, chills, night sweats, weight loss, swollen lymph nodes, body aches, joint swelling, muscle aches, chest pain, shortness of breath, mood changes.   Objective Blood pressure 114/76, pulse 69, height 5\' 3"  (1.6 m), weight 147 lb (66.679 kg), SpO2 95 %.  General: No apparent distress alert and oriented x3 mood and affect normal, dressed appropriately.  HEENT: Pupils equal,  extraocular movements intact  Respiratory: Patient's speak in full sentences and does not appear short of breath  Cardiovascular: No lower extremity edema, non tender, no erythema  Skin: Warm dry intact with no signs of infection or rash on extremities or on axial skeleton.  Abdomen: Soft nontender  Neuro: Cranial nerves II through XII are intact, neurovascularly intact in all extremities with 2+ DTRs and 2+ pulses.  Lymph: No lymphadenopathy of posterior or anterior cervical chain or axillae bilaterally.  Gait normal with good balance and coordination.  MSK:  Non tender with full range of motion and good stability and symmetric strength and tone of shoulders, elbows, wrist, hip, knee and ankles bilaterally.  Neck: Mild increase in lordosis No palpable stepoffs. Negative Spurling's  maneuver. Patient has mild limitation lacking the last 5 of extension as well as rotation bilaterally. Grip strength and sensation normal in bilateral hands Strength good C4 to T1 distribution No sensory change to C4 to T1 Negative Hoffman sign bilaterally Reflexes normal  Procedure note 57017; 15 minutes spent for Therapeutic exercises as stated in above notes.  This included exercises focusing on stretching, strengthening, with significant focus on eccentric aspects.  She given postural changes as well as range of motion exercises muscle energy techniques to help with the neck. Discussed proper alignment. Proper technique shown and discussed handout in great detail with ATC.  All questions were discussed and answered.     Impression and Recommendations:     This case required medical decision making of moderate complexity.

## 2014-12-10 NOTE — Assessment & Plan Note (Signed)
I do believe the patient does have more of a degenerative disc disease and likely contributes to some of the muscle spasm and seems patient had previously. Today she is not having any significant but does have some mild decrease in range of motion. We discussed continuing the vitamin D supplementation and we discussed what to do if she has another acute flare. We discussed manual massage, icing and heating protocol, and patient did learn exercises from athletic trainer today. Patient has any worsening symptoms that is not resolved with this patient will come back again for further evaluation. At that time I would also get x-rays. Today we did not get x-rays to see it would not change medical management.

## 2014-12-10 NOTE — Progress Notes (Signed)
Pre visit review using our clinic review tool, if applicable. No additional management support is needed unless otherwise documented below in the visit note. 

## 2014-12-15 ENCOUNTER — Other Ambulatory Visit: Payer: Self-pay | Admitting: Pulmonary Disease

## 2014-12-22 ENCOUNTER — Ambulatory Visit (INDEPENDENT_AMBULATORY_CARE_PROVIDER_SITE_OTHER): Payer: Medicare Other | Admitting: *Deleted

## 2014-12-22 DIAGNOSIS — Z5181 Encounter for therapeutic drug level monitoring: Secondary | ICD-10-CM

## 2014-12-22 DIAGNOSIS — I481 Persistent atrial fibrillation: Secondary | ICD-10-CM | POA: Diagnosis not present

## 2014-12-22 DIAGNOSIS — I4819 Other persistent atrial fibrillation: Secondary | ICD-10-CM

## 2014-12-22 DIAGNOSIS — Z7901 Long term (current) use of anticoagulants: Secondary | ICD-10-CM

## 2014-12-22 DIAGNOSIS — I4891 Unspecified atrial fibrillation: Secondary | ICD-10-CM

## 2014-12-22 LAB — POCT INR: INR: 3.2

## 2014-12-27 ENCOUNTER — Other Ambulatory Visit: Payer: Self-pay | Admitting: Pulmonary Disease

## 2015-01-07 ENCOUNTER — Telehealth: Payer: Self-pay | Admitting: Pulmonary Disease

## 2015-01-07 NOTE — Telephone Encounter (Signed)
Patient calling to get refill on Tramadol.   Looked in chart and Tramadol had been refilled on 12/27/14. Called [facility] and they confirmed that they did not receive this refill request Called in refill from 12/27/14  Patient notified. Nothing further needed.

## 2015-01-12 ENCOUNTER — Telehealth: Payer: Self-pay | Admitting: Pulmonary Disease

## 2015-01-12 MED ORDER — HYDROCODONE-HOMATROPINE 5-1.5 MG/5ML PO SYRP: 5.0000 mL | ORAL_SOLUTION | Freq: Four times a day (QID) | ORAL | Status: DC | PRN

## 2015-01-12 NOTE — Telephone Encounter (Signed)
Pt states she has a dry cough and is requesting a refill on the Hycodan cough syrup. States she is going out of town this weekend and would like to have it with her. Please advise if we can fill.

## 2015-01-12 NOTE — Telephone Encounter (Signed)
Per SN, Ok to refill

## 2015-01-12 NOTE — Telephone Encounter (Signed)
RX printed for SN to sign. LM for pt to come by office to pick up rx. Nothing further needed.

## 2015-01-27 ENCOUNTER — Other Ambulatory Visit: Payer: Self-pay | Admitting: Pulmonary Disease

## 2015-02-01 ENCOUNTER — Telehealth: Payer: Self-pay | Admitting: Pulmonary Disease

## 2015-02-01 NOTE — Telephone Encounter (Signed)
Patient calling about request for refill on cough medication. Medication is at front for patient to pick up. Patient notified. Nothing further needed.

## 2015-02-02 ENCOUNTER — Ambulatory Visit (INDEPENDENT_AMBULATORY_CARE_PROVIDER_SITE_OTHER): Payer: Medicare Other | Admitting: *Deleted

## 2015-02-02 DIAGNOSIS — Z5181 Encounter for therapeutic drug level monitoring: Secondary | ICD-10-CM

## 2015-02-02 DIAGNOSIS — I481 Persistent atrial fibrillation: Secondary | ICD-10-CM | POA: Diagnosis not present

## 2015-02-02 DIAGNOSIS — I4891 Unspecified atrial fibrillation: Secondary | ICD-10-CM | POA: Diagnosis not present

## 2015-02-02 DIAGNOSIS — I4819 Other persistent atrial fibrillation: Secondary | ICD-10-CM

## 2015-02-02 DIAGNOSIS — Z7901 Long term (current) use of anticoagulants: Secondary | ICD-10-CM

## 2015-02-02 LAB — POCT INR: INR: 3.5

## 2015-02-28 ENCOUNTER — Ambulatory Visit (INDEPENDENT_AMBULATORY_CARE_PROVIDER_SITE_OTHER): Payer: Medicare Other | Admitting: *Deleted

## 2015-02-28 DIAGNOSIS — I481 Persistent atrial fibrillation: Secondary | ICD-10-CM | POA: Diagnosis not present

## 2015-02-28 DIAGNOSIS — Z5181 Encounter for therapeutic drug level monitoring: Secondary | ICD-10-CM | POA: Diagnosis not present

## 2015-02-28 DIAGNOSIS — Z7901 Long term (current) use of anticoagulants: Secondary | ICD-10-CM | POA: Diagnosis not present

## 2015-02-28 DIAGNOSIS — I4891 Unspecified atrial fibrillation: Secondary | ICD-10-CM

## 2015-02-28 DIAGNOSIS — I4819 Other persistent atrial fibrillation: Secondary | ICD-10-CM

## 2015-02-28 LAB — POCT INR: INR: 2.2

## 2015-03-04 ENCOUNTER — Other Ambulatory Visit: Payer: Self-pay | Admitting: Pulmonary Disease

## 2015-03-09 ENCOUNTER — Telehealth: Payer: Self-pay | Admitting: Pulmonary Disease

## 2015-03-09 MED ORDER — ALLOPURINOL 300 MG PO TABS: 300.0000 mg | ORAL_TABLET | Freq: Every day | ORAL | Status: DC

## 2015-03-09 NOTE — Telephone Encounter (Signed)
Called spoke with she needs refill on allopurinol. Refill sent in. Nothing further needed

## 2015-03-25 ENCOUNTER — Telehealth: Payer: Self-pay | Admitting: Pulmonary Disease

## 2015-03-25 MED ORDER — POTASSIUM CHLORIDE CRYS ER 20 MEQ PO TBCR: 20.0000 meq | EXTENDED_RELEASE_TABLET | Freq: Two times a day (BID) | ORAL | Status: DC

## 2015-03-25 NOTE — Telephone Encounter (Signed)
Called spoke with pt. She needs refill on her K tabs. i have sent this for 90 day supply to [facility]. nothing further needed

## 2015-03-29 ENCOUNTER — Ambulatory Visit (INDEPENDENT_AMBULATORY_CARE_PROVIDER_SITE_OTHER): Payer: Medicare Other | Admitting: Pharmacist

## 2015-03-29 DIAGNOSIS — Z5181 Encounter for therapeutic drug level monitoring: Secondary | ICD-10-CM | POA: Diagnosis not present

## 2015-03-29 DIAGNOSIS — Z7901 Long term (current) use of anticoagulants: Secondary | ICD-10-CM

## 2015-03-29 DIAGNOSIS — I481 Persistent atrial fibrillation: Secondary | ICD-10-CM | POA: Diagnosis not present

## 2015-03-29 DIAGNOSIS — I4891 Unspecified atrial fibrillation: Secondary | ICD-10-CM

## 2015-03-29 DIAGNOSIS — I4819 Other persistent atrial fibrillation: Secondary | ICD-10-CM

## 2015-03-29 LAB — POCT INR: INR: 2.1

## 2015-04-14 ENCOUNTER — Encounter: Payer: Self-pay | Admitting: Gastroenterology

## 2015-04-16 ENCOUNTER — Other Ambulatory Visit: Payer: Self-pay | Admitting: Internal Medicine

## 2015-04-18 ENCOUNTER — Telehealth: Payer: Self-pay | Admitting: Pulmonary Disease

## 2015-04-18 NOTE — Telephone Encounter (Signed)
Spoke with pt. States that she wants a refill on Hycodan cough syrup. When asked how long has she been coughing she stated, "I am not coughing that much but that medication helps me go right on off to sleep and that's what I want." Advised her that I would speak to SN about her refill.  Per SN >> this medication is not used for sleep and if that's what she wants she can use Tylenol PM. As for the cough she can use Delsym OTC.  Pt is aware of SN's recommendations. Nothing further was needed.

## 2015-04-26 ENCOUNTER — Ambulatory Visit (INDEPENDENT_AMBULATORY_CARE_PROVIDER_SITE_OTHER): Payer: Medicare Other | Admitting: *Deleted

## 2015-04-26 DIAGNOSIS — Z7901 Long term (current) use of anticoagulants: Secondary | ICD-10-CM | POA: Diagnosis not present

## 2015-04-26 DIAGNOSIS — Z5181 Encounter for therapeutic drug level monitoring: Secondary | ICD-10-CM | POA: Diagnosis not present

## 2015-04-26 DIAGNOSIS — I4891 Unspecified atrial fibrillation: Secondary | ICD-10-CM | POA: Diagnosis not present

## 2015-04-26 DIAGNOSIS — I481 Persistent atrial fibrillation: Secondary | ICD-10-CM

## 2015-04-26 DIAGNOSIS — I4819 Other persistent atrial fibrillation: Secondary | ICD-10-CM

## 2015-04-26 LAB — POCT INR: INR: 2.4

## 2015-05-10 ENCOUNTER — Encounter: Payer: Self-pay | Admitting: Pulmonary Disease

## 2015-05-10 ENCOUNTER — Ambulatory Visit (INDEPENDENT_AMBULATORY_CARE_PROVIDER_SITE_OTHER): Payer: Medicare Other | Admitting: Pulmonary Disease

## 2015-05-10 DIAGNOSIS — I272 Other secondary pulmonary hypertension: Secondary | ICD-10-CM

## 2015-05-10 DIAGNOSIS — Z23 Encounter for immunization: Secondary | ICD-10-CM | POA: Diagnosis not present

## 2015-05-10 DIAGNOSIS — I481 Persistent atrial fibrillation: Secondary | ICD-10-CM

## 2015-05-10 DIAGNOSIS — I4819 Other persistent atrial fibrillation: Secondary | ICD-10-CM

## 2015-05-10 DIAGNOSIS — I10 Essential (primary) hypertension: Secondary | ICD-10-CM | POA: Diagnosis not present

## 2015-05-10 NOTE — Patient Instructions (Signed)
Today we updated your med list in our EPIC system...    Continue your current medications the same...  Keep up the good work w/ diet & exercise...  Call for any questions...  Let's plan a follow up visit in 53mo w/ CXR & FASTING labs at that time.Marland KitchenMarland Kitchen

## 2015-05-10 NOTE — Progress Notes (Signed)
Subjective:    Patient ID: Cheyenne [name], female    DOB: 1926-06-04, 81 y.o.   MRN: [id number]  HPI 81 y/o BF here for a follow up visit... she has multiple medical problems as noted below>>> ~  SEE PREV EPIC NOTES FOR OLDER DATA >>    LABS 11/13:  Chems- ok w/ K=3.7 Co2=34 BUN=23 Creat=0.9;  BNP=470...   CXR 5/14 showed cardiomeg, clear lungs, DJD spine, NAD...  LABS 5/14:  Chems- ok x K=3.4;  CBC- ok w/ Hg=12.3;  TSH=1.38;  VitD=31;  BNP=463...  CXR 5/15 showed mild cardiomeg, clear lungs, DJD spine, surg clips right axilla, NAD...  LABS 5/15:  Chems- ok x K=3.4 on K20/d & asked to incr back to Bid;    Ambulate on RA:  96% O2sat on RA at rest w/ HR=52;  87% ob RA after 1lap w/ HR=109 => continue same O2 therapy...   ~  April 30, 2014:  44mo ROV & Cheyenne [name] is about the same, notes low energy & having to go to the BR a lot; I not that she has gained 10 lbs more (up to 156#) w/ 4+edema in feet, daughter says she is using "sea salt" & we had the conversation (again) about NO SALT- only allowed to use substitute or MrsDash;  She remains on Lasix40Qam & Diamox250-2Qpm along w/ K20Bid> BMet is OK w/ K=4.1, HCO3=33, BUN=16, Cr=0.9; we will increase her Lasix & f/u labs in 6wks... Her breathing is at baseline, on O2 at 2-3L/min flow, and chest is clear w/o wheezing, rales, or rhonchi...  BP remains well controlled on these diuretics plus Inderal40Bid + Lisin40 Qd...  She continues on coumadin via the CC...    We reviewed prob list, meds, xrays and labs> see below for updates >> OK Flu shots today and refills per request...   LABS 11/15:  BMet- ok w/ K=4.1, HCO3=33, BUN=16, Cr=0.9.Marland KitchenMarland Kitchen PLAN>>  In light of her further weight gain & BMet above- rec to increase Lasix40 to 2tabs Qam + keep the Diamox250- 2tabsQpm + keep the K20Bid;  She will need BMet checked again in about 6wks (the fisrst wk of Jan);  Reminder- NO SALT, elev legs, wear support hose...   ~  Nov 03, 2014:  57mo ROV & Cheyenne [name] has had  a stable interval- CC is itching on her back, no rash, no new meds etc; states her breathing is ok, DOE w/o change, no CP, min cough & Hycodan prn helps; weight is down 5# to 151# & less edema, reminded to elim all salt from her diet... We reviewed the following medical problems during today's office visit >>     Hypoxemia, PulmHTN> she has HomeO2 (not using it now), ProairHFA prn, & Hycodan cough syrup; she feels her breathing is at baseline; last 2DEcho 5/13 w/ PAsys~50 & stable...    HBP> on ASA81, Propran80-1/2Bid, Quinapril40, Lasix40-2Qam, Diamox250-2Qpm, & K20Bid; BP= 118/70 & she denies CP, palpit, ch in SOB, & decr edema w/ 5# wt loss.    AFib, SSS, Tachy-brady> on Coumadin (via CC); followed by DrKlein for Cards/EP.Marland KitchenMarland Kitchen    Venous Insuffic & edema> she had incr ankle edema & improved w/ elim sodium, elev legs, support hose & continue same diuretics...    DM> on diet alone; wt is down to 151# today; BS has been normal ~100...    GI- Divertics, IBS, Polyps> on Immodium prn; she saw DrStark 12/13> IBS, hx divertics & colon polyps, he did not rec surveillance colonoscopy for her.Marland KitchenMarland Kitchen  GU- bladder symptoms> she has freq & urge/stress incont- trial Oxybutynin10mg  & eval by Urology DrManny> rec changing diuretics if poss but it is not poss...    Hx bilat breast cancers> s/p mastectomies in 1996 & 2002, followed by [facility] & doing well w/o known recurrence...    DJD> on Allopurinol 300, Tramadol50; stable w/ mod DJD & limited mobility (knees, hips)...    Early dementia, anxiety> Aware- family helps w/ her meds & helps at home... We reviewed prob list, meds, xrays and labs> see below for updates >> she is due for PREVNAR-13 shot today...  CXR 5/16 showed mild cardiomeg, clear lungs, no effusion, NAD...   LABS 5/16>  Chems- wnl w/ HCO3=34, Cr=0.90;  CBC- wnl w/ Hg=12.1, Fe=65 (22%sat), Ferritin=113;  TSH=2.65...   IMP/PLAN >> Cheyenne [name] is reasonably stable at age 44- given Prevnar-82 today, meds  refilled by request, same meds...   ~  May 10, 2015:  78mo ROV & Cheyenne [name] reports doing well- no new complaints or concerns; Epic review shows several calls for refill Hycodan cough syrup (says she uses it for sleep!=> rec to try TylenolPM & Delsym, not the Hydrocodon);  She says her breathing is improved, denies SOB/ cough/ sput, CP, palpit, etc;  She uses her O2 w/ activity & she exercises w/ silver Sneakers once per wk & exercise bike at home...    Hypoxemia, PulmHTN> she has HomeO2 (not using it now), ProairHFA prn, & prev Hycodan cough syrup; she feels her breathing is at baseline; last 2DEcho 5/13 w/ PAsys~50 & stable...    HBP> on ASA81, Propran80-1/2Bid, Accupril40, Lasix40-2Qam, Diamox250-2Qpm, & K20Bid; BP= 134/76 & she denies CP, palpit, ch in SOB, & decr edema w/ 5# wt loss.    AFib, SSS, Tachy-brady> on Coumadin (via CC); followed by DrKlein for Cards/EP.Marland KitchenMarland Kitchen    Venous Insuffic & edema> she had incr ankle edema & improved w/ elim sodium, elev legs, support hose & continue same diuretics...    DM> on diet alone; wt is down to 144# today; BS has been normal ~100...    GI- Divertics, IBS, Polyps> on Immodium prn; she saw DrStark 12/13> IBS, hx divertics & colon polyps, he did not rec surveillance colonoscopy for her...    GU- bladder symptoms> she has freq & urge/stress incont- trial Oxybutynin10mg  & eval by Urology DrManny> rec changing diuretics if poss but it is not poss...    Hx bilat breast cancers> s/p mastectomies in 1996 & 2002, followed by [facility] & doing well w/o known recurrence...    DJD> on Allopurinol 300, Tramadol50, VitD2000; stable w/ mod DJD & limited mobility (knees, hips); she saw DrZSmith for neck pain, DDD, musc spasm, decr ROM- given exercises, ice/heat, Pennsaid, Tylenol, VitD rx.    Early dementia, anxiety> Aware- family helps w/ her meds & helps at home... EXAM shows Afeb, VSS, O2sat=96% on RA at rest;  Heent- neg, mallampati2;  Chest- clear w/o w/r/r; Heart- RR  gr1/6 w/o r/g; Abd- soft, nontender, neg; Ext- VI, trace edema; Neuro- memory prob, no focal abn... IMP/PLAN>>  Cheyenne [name] is stable, same meds, OK 2016 Flu vaccine today, we plan ROV 72mo...            Problem List:  ALLERGIC RHINITIS (ICD-477.9) - on FLONASE Qhs & ZYRTEK QAM Prn.  Hx of HYPOXEMIA (ICD-799.02) - full eval for hypoxemia in [id number]  ~  7/08:  CXR= cardiomegaly, ectatic Ao, pulm ven HTN... CTAngio= neg, without PE, min scarring in lingula... 2DEcho= norm LVF w/ EF=55%,  LVwall thickness at upper limits, mild ca++ AoV w/ mild AI,  PA sys pressure = 21mmHg... O2 sats 90-94 at rest on RA... w/ ambulation in the office sats drop to 85%... she didn't yet want to go on oxygen at home... just occas SOB w/ activity- she is too sedentary and needs to incr her exercise program. ~  CTChest 6/09 showed bilat mastectomies, no adenopathy, no fluid, 2 sm nodules 4-58mm size RML/LLL, NAD... ~  6/10: c/o increased DOE & no energy- O2 sat= 91% rest & 86% w/activ... rec> home O2 1L/min rest, 2L/min exercise. ~  4/11 hosp> V/Q scan w/ normal vent & norm perfusion... CXR 4/11 showed cardiomeg, clear lungs, no CHF, surg clips in right axilla... ~  8/11:  pt requesting change to LiqO2 to incr mobility. ~  11/12:  O2 sat= 94% at rest on 2L/min oxygen via [place]... remains stable. ~  11/13:  O2 sat= 100% on 2L/min at rest... ~  CXR 5/14 showed cardiomeg, clear lungs, DJD spine, NAD ~  99991111:  O2 recert today> O2 sat on RA at rest=96% w/ HR=65/min;  Ambulated 3 laps on RA w/ nadir O2 sat=88% w/ HR=116; Rec to continue O2 w/ exercise & Qhs... ~  CXR 5/15 showed mild cardiomeg, clear lungs, DJD spine, surg clips right axilla, NAD.Marland Kitchen. ~  5/15: Ambulate on RA:  96% O2sat on RA at rest w/ HR=52;  87% ob RA after 1lap w/ HR=109 => continue same O2 therapy ~  11/15: she remains on O2 at 2-3L/min flow; O2sats on 3L/min today = 98%... ~  5/16: she has port O2 at home but states not using it now; O2sat=94% on RA at rest  today...   HYPERTENSION (ICD-401.9) - controlled on PROPRANOLOL 80mg Bid, QUINAPRIL 40mg /d; and back on LASIX 40mg AM, KCL 21mEqAM, & ACETAZOLAMIDE 250mg - 2tabsPM... she knows to avoid sodium etc... ~  labs 2/09 showed Na143, K3.6, CL101, CO2 36, BUN 15, Cr0.9.Marland KitchenMarland Kitchen BNP was 194... ~  labs 6/09 showed lytes normal x TCO2= 37, & BNP= 512... ~  labs 12/09 showed HCO3- 36,  BNP= 586... rec- same meds, no salt! ~  labs 6/10 showed Na= 146, K= 3.6, CO2= 36, BUN= 14, Creat= 0.9, BNP= 336. ~  labs 9/10 showed Na-147, K=3.7, HCO3=36, BUN=18, Creat=0.9, BNP=484 ~  labs 1/11 showed Na=144, K=3.9, HCO3=35, BUN=17, Creat=0.9, BNP=488 ~  [facility] 4/11 & meds changed by TH> off diuretics & KCl... CXR showed cardiomeg, clear lungs, clips right axilla. ~  labs 10/15/09 showed Na=143, K=4.0, HCO3=37, BUN=12, Creat=0.8, BNP=761... restart Lasix, Diamox, KCl (one each). ~  labs 10/25/09 showed HCO3=44, BNP=588... rec> incr Diamox to 2Qpm... ~  labs 8/11 showed HCO3=39, BNP=481... continue same. ~  Labs 5/12 showed HCO3=35, BNP=373... Improved, continue same Rx. ~  Labs 11/12 showed HCO3=37, BNP=351... Stable continue same meds. ~  5/13: BP= 138/88 & HCO3=39, BUN=17, Creat=0.9, BNP=534... Reminded to take meds daily. ~  11/3: on ASA81, Propran80Bid, Quinapril40, Lasix40, Diamox250-2/d, & K20; BP= 128/82 & she denies CP, palpit, ch in SOB or edema...  ~  5/14: on ASA81, Propran80Bid, Quinapril40, Lasix40, Diamox250-2/d, & K20Bid; BP= 136/80 & she denies CP, palpit, ch in SOB or edema ~  11/14: on ASA81, Propran80-1/2Bid, Quinapril40, Lasix40, Diamox250-2/d, & K20Bid; BP= 142/80 & she remains essentially asymptomatic... ~  5/15: on ASA81, Propran80-1/2Bid, Quinapril40, Lasix40, Diamox250-2/d, & K20Bid; BP= 144/80 & she denies CP, palpit, ch in SOB, but has developed ankle edema & 5# wt gain ~  11/15: on ASA81, Propran80-1/2Bid,  Quinapril40, Lasix40, Diamox250-2/d, & K20Bid; BP= 120/80 & she remains minimally symptomatic but  minimally active as well... ~  5/16: on ASA81, Propran80-1/2Bid, Quinapril40, Lasix40-2Qam, Diamox250-2Qpm, & K20Bid; BP= 118/70 & she denies CP, palpit, ch in SOB, & decr edema w/ 5# wt loss.  ATRIAL FIBRILLATION (ICD-427.31) & SICK SINUS/ TACHY-BRADY SYNDROME (ICD-427.81) - new prob 1/10 w/ eval by DrKlein- rate control strategy on Propranolol w/ [facility]. ~  7/08:  2DEcho= norm LVF w/ EF=55%, LVwall thickness at upper limits, mild ca++ AoV w/ mild AI,  PA sys pressure = 43mmHg... ~  EKG 1/10 showed AFib, rate~ 60, NSSTTWA... ~  2DEcho 3/10 showed norm LVF w/ EF= 60% & no regional wall motion abn, mild calcif AoV w/ mild AI, mild LA & RA dil w/ incr thickness of interatrial septum c/w lipomatous hypertrophy, PA sys est at 50... ~  Holter monitor 3/10 = AFib w/ rate 45-97... ~  2DEcho 4/11 in hosp showed norm LVwall thickness & EF=55%, mild RVdil w/ mod decr RV function, mod TR & PAsys~69... ~  2DEcho 5/13 showed normal LVF w/ EF=55%, mild AI & MR, mild LA & RA dilatation, PAsys=49... Stable. ~  Stable on Coumadin & followed by DrKlein...  PULMONARY HYPERTENSION >> Serial 2DEchos showed progressive incr PA sys estimates... But the most recent 2DEcho 5/13 was sl improved (see above). ~  V/Q lung scan 4/11 was neg for PE... ~  2DEcho 5/13 showed normal LVF w/ EF=55%, mild AI & MR, mild LA & RA dilatation, PAsys=49... Stable. ~  She is way too sedentary but not motivated to exercise etc...  VENOUS INSUFFICIENCY (ICD-459.81) - mild persist edema despite the sodium restriction, Lasix and Diamox... reviewed recommendation for No Salt, Elevation, TED's, etc... ~  11/15: she presented w/ 10# further wt gain and 4+edema in feet; BMet was OK & we decided to incr Lasix40 to 2tabsQam, continue other meds 7 recheck labs in 6wks... ~  6/15: she had incr ankle edema & improved w/ elim sodium, elev legs, support hose & continue same diuretics  DIABETES MELLITUS, BORDERLINE (ICD-790.29) -  prev on Metformin 500mg /d (held after 4/11 hosp)... ~  last BS=181, HgA1c=6.4 in 11/08... ~  labs 6/09 showed BS= 101, HgA1c= 6.4 ~  labs 12/09 showed BS= 139,  HgA1c= 6.1 ~  labs 6/10 showed BS= 123, A1c= 6.1 ~  7/10: neg ophthal f/u DrHecker- no retinopathy, no macular edema. ~  labs 9/10 showed BS= 93 ~  labs 1/11 showed BS=146, A1c=5.7 ~  labs 5/11 showed BS= 100 on diet alone... ~  labs 8/11 showed BS= 96 ~  Labs 5/12 showed BS= 113, A1c= 5.8 ~  9/12:  Ophthalmology check by DrHecker- no DM retinopathy... ~  Labs 11/12 showed BS= 104 ~  Labs 5/13 showed BS= 118 ~  Labs 11/13 showed BS= 97 ~  2/14: she had a neg Ophthalmology eval by DrHecker... ~  Labs 5/14 showed BS= 102 on diet alone... ~  On diet alone, BS ~100 and stable...  DIVERTICULOSIS OF COLON, IRRITABLE BOWEL SYNDROME & COLONIC POLYPS (ICD-211.3) ~  colonoscopy was 11/06 by DrStark showing divertics and several polyps (one 49mm adenoma)... f/u 61yrs. ~  colonoscopy 1/10 by DrStark showed divertics (severe in sigmoid), 71mm polyp= tub adenoma... f/u 3 yrs. ~  12/13:  She had GI f/u DrStark> IBS w/ freq loose stools, occas BRB, hx extensive divertics & adenomatous polyps, on coumadin for AFib;   URINARY FREQ&URGENCY >>  ~  11/13:  she had a GU eval by DrManny> c/o freq & urgency assoc w/ Lasix, some leakage & managing w/ one pad/d, some better w/ Oxybutynin... ~  5/14: she had f/u DrManny, Urology> urinary freq & urgency; worse w/ Lasix; mild leakage & uses 1 pad/d; Oxybutynin caused dry mouth; situation is acceptable & no further meds rec...  BILAT BREAST CANCERS - resected via mastectomies in 1996 and 2002... followed by [facility] annually & seen 8/12> note reviewed. ~  1/15: she had her yearly f/u by [facility]- his note is reviewed, stable, wound over sternum is resolved...   DEGENERATIVE JOINT DISEASE (ICD-715.90) - w/ hx hyperuricemia on ALLOPURINOL 300mg /d & has TRAMADOL 50mg  prn pain...  Hx of HEADACHE (ICD-784.0) ~   MRI Brain 4/11 showed atrophy & sm vessel dis, degen changes at C1-2 w/ some sp stenosis caused part by ant slip of the C1 ring, incidental part empty sella...  ANXIETY (ICD-300.00) - prev rec to take Alpraz 0,5mg  Prn but she never filled the Rx. ~  Remeron 7.5mg /d started 4/11 hosp & stopped 5/11 due to lethargy, sleepiness...   Past Surgical History  Procedure Laterality Date  . Double mastectomy    . Knee surgery  2004    RIGHT  . Cataract extraction    . Cholecystectomy    . Mastectomy  1996    right breast for breast cancer  . Mastectomy  1/02    left - Dr Lucia Gaskins  . Total knee arthroplasty  6/04    right - Dr Telford Nab  . Breast surgery      Outpatient Encounter Prescriptions as of 05/10/2015  Medication Sig  . acetaZOLAMIDE (DIAMOX) 250 MG tablet TAKE 2 TABLETS BY MOUTH DAILY AT 4PM  . albuterol (PROVENTIL HFA;VENTOLIN HFA) 108 (90 BASE) MCG/ACT inhaler Inhale 1-2 puffs into the lungs every 6 (six) hours as needed for wheezing or shortness of breath.  . allopurinol (ZYLOPRIM) 300 MG tablet Take 1 tablet (300 mg total) by mouth daily.  Marland Kitchen aspirin 81 MG tablet Take 81 mg by mouth daily.    . Cholecalciferol (VITAMIN D) 2000 UNITS CAPS Take 1 capsule by mouth daily.  . furosemide (LASIX) 40 MG tablet TAKE 2 TABLET EVERY MORNING  . loperamide (IMODIUM) 2 MG capsule Take 2 mg by mouth as needed for diarrhea or loose stools.   . Multiple Vitamin (MULTIVITAMIN) capsule Take 1 capsule by mouth daily.    Marland Kitchen oxybutynin (DITROPAN-XL) 10 MG 24 hr tablet TAKE 1 TABLET EVERY DAY FOR BLADDER SPASMS  . potassium chloride SA (KLOR-CON M20) 20 MEQ tablet Take 1 tablet (20 mEq total) by mouth 2 (two) times daily.  . propranolol (INDERAL) 80 MG tablet TAKE 1/2 TABLET BY MOUTH TWO TIMES DAILY  . quinapril (ACCUPRIL) 40 MG tablet TAKE 1 TABLET (40 MG TOTAL) BY MOUTH DAILY.  . traMADol (ULTRAM) 50 MG tablet TAKE 1 TABLET THREE TIMES A DAY AS NEEDED FOR PAIN  . warfarin (COUMADIN) 5 MG tablet TAKE AS  DIRECTED BY COUMADIN CLINIC  . HYDROcodone-homatropine (HYCODAN) 5-1.5 MG/5ML syrup Take 5 mLs by mouth every 6 (six) hours as needed for cough. (Patient not taking: Reported on 05/10/2015)   No facility-administered encounter medications on file as of 05/10/2015.    Allergies  Allergen Reactions  . Iohexol      Desc: IV History sheet from prior CT states IV Contrast allergy- 13 hour prep given.     Immunization History  Administered Date(s) Administered  . H1N1 06/07/2008  . Influenza  Split 04/24/2011, 04/22/2012  . Influenza Whole 03/08/2009, 06/26/2010  . Influenza,inj,Quad PF,36+ Mos 04/28/2013, 04/30/2014, 05/10/2015  . Pneumococcal Conjugate-13 11/09/2014  . Pneumococcal Polysaccharide-23 10/10/2009    Current Medications, Allergies, Past Medical History, Past Surgical History, Family History, and Social History were reviewed in [employer] record.    Review of Systems         See HPI - all other systems neg except as noted...  The patient complains of dyspnea on exertion and peripheral edema.  The patient denies anorexia, fever, weight loss, weight gain, vision loss, decreased hearing, hoarseness, chest pain, syncope, prolonged cough, headaches, hemoptysis, abdominal pain, melena, hematochezia, severe indigestion/heartburn, hematuria, incontinence, muscle weakness, suspicious skin lesions, transient blindness, difficulty walking, depression, unusual weight change, abnormal bleeding, enlarged lymph nodes, and angioedema.     Objective:   Physical Exam      WD, WN, 81 y/o BF in NAD... GENERAL:  Alert & oriented; pleasant & cooperative... HEENT:  [place]/AT, EOM-wnl, PERRLA, EACs-clear, TMs-wnl, NOSE-clear, THROAT-clear & wnl. NECK:  Supple w/ fairROM; no JVD; normal carotid impulses w/o bruits; no thyromegaly or nodules palpated; no lymphadenopathy. CHEST:  Clear to P & A; without wheezes/ rales/ or rhonchi heard... HEART:  irregular rhythm; gr 1/6 SEM  without rubs or gallops appreciated... ABDOMEN:  Soft & nontender; normal bowel sounds; no organomegaly or masses detected. EXT: without deformities, mild arthritic changes; no varicose veins/ +venous insuffic/ tr+edema today... NEURO:  CN's intact; no focal neuro deficits... DERM:  There is a rounded lesion left calf c/w ringworm...  RADIOLOGY DATA:  Reviewed in the EPIC EMR & discussed w/ the patient...  LABORATORY DATA:  Reviewed in the EPIC EMR & discussed w/ the patient...   Assessment & Plan:    Ven Insuffic & Edema>  She is on Lasix40-2Qam, Diamox250-2Qpm, K20Bid; Chems are ok & reminded NO SALT, elevate, TED hose etc...   HYPOXEMIA & CO2 retention>  Stable on her O2 (but only using it prn) & w/ diuretic regimen including Acetazolamide, continue same... PULM HYPERTENSION ?Etiology>  We rechecked 2DEcho 5/13 & everything looks stable at age 66 compared to 73yrs prev, continue same meds for now.  HBP>  Controlled on Propranolol, Quinapril, Lasix, & Diamox; continue same...  AFib>  Stable on rate control strategy, no apparent prob w/ tachy or brady; continue same meds...  DM>  Well maintained on diet Rx...  GI>  Divertics, IBS, Polyps>  Stable, we discussed Simethacone Rx for gas...  GU> trial Oxybutynin10 for bladder symptoms and refer to Urology per daugh request...  Hx bilat breast cancers>  Stable, followed by [facility], no known recurrence... Skin lesion on chest wall resolved w/ Rx from [facility]...  DJD>  Hx hyperuricemia on Allopurinol Rx & no clinical attacks...  Anxiety>  Stable on Prn Alprazolam Rx...   Patient's Medications  New Prescriptions   No medications on file  Previous Medications   ACETAZOLAMIDE (DIAMOX) 250 MG TABLET    TAKE 2 TABLETS BY MOUTH DAILY AT 4PM   ALBUTEROL (PROVENTIL HFA;VENTOLIN HFA) 108 (90 BASE) MCG/ACT INHALER    Inhale 1-2 puffs into the lungs every 6 (six) hours as needed for wheezing or shortness of breath.   ALLOPURINOL  (ZYLOPRIM) 300 MG TABLET    Take 1 tablet (300 mg total) by mouth daily.   ASPIRIN 81 MG TABLET    Take 81 mg by mouth daily.     CHOLECALCIFEROL (VITAMIN D) 2000 UNITS CAPS    Take 1 capsule by  mouth daily.   FUROSEMIDE (LASIX) 40 MG TABLET    TAKE 2 TABLET EVERY MORNING   HYDROCODONE-HOMATROPINE (HYCODAN) 5-1.5 MG/5ML SYRUP    Take 5 mLs by mouth every 6 (six) hours as needed for cough.   LOPERAMIDE (IMODIUM) 2 MG CAPSULE    Take 2 mg by mouth as needed for diarrhea or loose stools.    MULTIPLE VITAMIN (MULTIVITAMIN) CAPSULE    Take 1 capsule by mouth daily.     OXYBUTYNIN (DITROPAN-XL) 10 MG 24 HR TABLET    TAKE 1 TABLET EVERY DAY FOR BLADDER SPASMS   POTASSIUM CHLORIDE SA (KLOR-CON M20) 20 MEQ TABLET    Take 1 tablet (20 mEq total) by mouth 2 (two) times daily.   PROPRANOLOL (INDERAL) 80 MG TABLET    TAKE 1/2 TABLET BY MOUTH TWO TIMES DAILY   QUINAPRIL (ACCUPRIL) 40 MG TABLET    TAKE 1 TABLET (40 MG TOTAL) BY MOUTH DAILY.   TRAMADOL (ULTRAM) 50 MG TABLET    TAKE 1 TABLET THREE TIMES A DAY AS NEEDED FOR PAIN   WARFARIN (COUMADIN) 5 MG TABLET    TAKE AS DIRECTED BY COUMADIN CLINIC  Modified Medications   No medications on file  Discontinued Medications   No medications on file

## 2015-05-31 ENCOUNTER — Other Ambulatory Visit: Payer: Self-pay | Admitting: Pulmonary Disease

## 2015-06-07 ENCOUNTER — Ambulatory Visit (INDEPENDENT_AMBULATORY_CARE_PROVIDER_SITE_OTHER): Payer: Medicare Other | Admitting: *Deleted

## 2015-06-07 DIAGNOSIS — Z7901 Long term (current) use of anticoagulants: Secondary | ICD-10-CM

## 2015-06-07 DIAGNOSIS — Z5181 Encounter for therapeutic drug level monitoring: Secondary | ICD-10-CM

## 2015-06-07 DIAGNOSIS — I481 Persistent atrial fibrillation: Secondary | ICD-10-CM | POA: Diagnosis not present

## 2015-06-07 DIAGNOSIS — I4891 Unspecified atrial fibrillation: Secondary | ICD-10-CM

## 2015-06-07 DIAGNOSIS — I4819 Other persistent atrial fibrillation: Secondary | ICD-10-CM

## 2015-06-07 LAB — POCT INR: INR: 2.7

## 2015-07-11 ENCOUNTER — Other Ambulatory Visit: Payer: Self-pay | Admitting: Pulmonary Disease

## 2015-07-20 ENCOUNTER — Ambulatory Visit (INDEPENDENT_AMBULATORY_CARE_PROVIDER_SITE_OTHER): Payer: Medicare Other | Admitting: *Deleted

## 2015-07-20 DIAGNOSIS — Z5181 Encounter for therapeutic drug level monitoring: Secondary | ICD-10-CM

## 2015-07-20 DIAGNOSIS — Z7901 Long term (current) use of anticoagulants: Secondary | ICD-10-CM | POA: Diagnosis not present

## 2015-07-20 DIAGNOSIS — I4891 Unspecified atrial fibrillation: Secondary | ICD-10-CM

## 2015-07-20 DIAGNOSIS — I481 Persistent atrial fibrillation: Secondary | ICD-10-CM | POA: Diagnosis not present

## 2015-07-20 DIAGNOSIS — I4819 Other persistent atrial fibrillation: Secondary | ICD-10-CM

## 2015-07-20 LAB — POCT INR: INR: 2.2

## 2015-08-09 ENCOUNTER — Other Ambulatory Visit: Payer: Self-pay | Admitting: Pulmonary Disease

## 2015-08-29 ENCOUNTER — Emergency Department (HOSPITAL_COMMUNITY)
Admission: EM | Admit: 2015-08-29 | Discharge: 2015-08-29 | Disposition: A | Discharge status: Home / Self Care | Payer: Medicare Other | Attending: Emergency Medicine | Admitting: Emergency Medicine

## 2015-08-29 ENCOUNTER — Encounter (HOSPITAL_COMMUNITY): Payer: Self-pay | Admitting: Emergency Medicine

## 2015-08-29 ENCOUNTER — Emergency Department (HOSPITAL_COMMUNITY): Payer: Medicare Other

## 2015-08-29 DIAGNOSIS — R1032 Left lower quadrant pain: Secondary | ICD-10-CM | POA: Diagnosis not present

## 2015-08-29 DIAGNOSIS — Z7901 Long term (current) use of anticoagulants: Secondary | ICD-10-CM | POA: Insufficient documentation

## 2015-08-29 DIAGNOSIS — I10 Essential (primary) hypertension: Secondary | ICD-10-CM | POA: Diagnosis not present

## 2015-08-29 DIAGNOSIS — Z79899 Other long term (current) drug therapy: Secondary | ICD-10-CM | POA: Diagnosis not present

## 2015-08-29 DIAGNOSIS — I4891 Unspecified atrial fibrillation: Secondary | ICD-10-CM | POA: Insufficient documentation

## 2015-08-29 DIAGNOSIS — Z853 Personal history of malignant neoplasm of breast: Secondary | ICD-10-CM | POA: Diagnosis not present

## 2015-08-29 DIAGNOSIS — R58 Hemorrhage, not elsewhere classified: Secondary | ICD-10-CM

## 2015-08-29 DIAGNOSIS — N898 Other specified noninflammatory disorders of vagina: Secondary | ICD-10-CM | POA: Diagnosis present

## 2015-08-29 DIAGNOSIS — Z7982 Long term (current) use of aspirin: Secondary | ICD-10-CM | POA: Insufficient documentation

## 2015-08-29 DIAGNOSIS — Z8719 Personal history of other diseases of the digestive system: Secondary | ICD-10-CM | POA: Diagnosis not present

## 2015-08-29 DIAGNOSIS — F419 Anxiety disorder, unspecified: Secondary | ICD-10-CM | POA: Insufficient documentation

## 2015-08-29 DIAGNOSIS — M7981 Nontraumatic hematoma of soft tissue: Secondary | ICD-10-CM | POA: Diagnosis not present

## 2015-08-29 DIAGNOSIS — T148XXA Other injury of unspecified body region, initial encounter: Secondary | ICD-10-CM

## 2015-08-29 LAB — BASIC METABOLIC PANEL
Anion gap: 8 (ref 5–15)
BUN: 22 mg/dL — ABNORMAL HIGH (ref 6–20)
CHLORIDE: 109 mmol/L (ref 101–111)
CO2: 31 mmol/L (ref 22–32)
Calcium: 9.6 mg/dL (ref 8.9–10.3)
Creatinine, Ser: 0.81 mg/dL (ref 0.44–1.00)
GFR calc Af Amer: >60 mL/min (ref >60)
GFR calc non Af Amer: >60 mL/min (ref >60)
Glucose, Bld: 107 mg/dL — ABNORMAL HIGH (ref 65–99)
Potassium: 4.7 mmol/L (ref 3.5–5.1)
SODIUM: 148 mmol/L — AB (ref 135–145)

## 2015-08-29 LAB — CBC
HEMATOCRIT: 36.6 % (ref 36.0–46.0)
HEMOGLOBIN: 11.5 g/dL — AB (ref 12.0–15.0)
MCH: 31 pg (ref 26.0–34.0)
MCHC: 31.4 g/dL (ref 30.0–36.0)
MCV: 98.7 fL (ref 78.0–100.0)
Platelets: 173 10*3/uL (ref 150–400)
RBC: 3.71 MIL/uL — ABNORMAL LOW (ref 3.87–5.11)
RDW: 14.4 % (ref 11.5–15.5)
WBC: 6.1 10*3/uL (ref 4.0–10.5)

## 2015-08-29 LAB — PROTIME-INR
INR: 2.15 — AB (ref 0.00–1.49)
Prothrombin Time: 23.9 seconds — ABNORMAL HIGH (ref 11.6–15.2)

## 2015-08-29 NOTE — ED Provider Notes (Signed)
CSN: [id number]     Arrival date & time 08/29/15  1403 History   First MD Initiated Contact with Patient 08/29/15 2100     Chief Complaint  Patient presents with  . Abscess    "bump" on vagina   HPI   Cheyenne [name] is a 82 y.o. female PMH significant for A. fib presenting with bruising and a labial "bump" since Thursday. She states that initially the bump hurt but it is now painless. Her daughter, present at bedside, states that the patient complained about bruising on Saturday. She states the bruising started in her left lower quadrant and then spread to her mons pubis. She denies injury, fevers, chills, chest pain, shortness of breath, palpitations, abdominal pain, nausea, vomiting, vaginal bleeding or complaints.  Past Medical History  Diagnosis Date  . Allergic rhinitis   . Hypoxemia   . HTN (hypertension)   . Atrial fibrillation ([place])   . Sick sinus syndrome with tachycardia ([place])   . Venous insufficiency   . Borderline diabetes mellitus   . IBS (irritable bowel syndrome)   . Tubulovillous adenoma of colon 2006  . 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 DJD (degenerative joint disease)   . Headache(784.0)   . Anxiety   . Diverticulosis    Past Surgical History  Procedure Laterality Date  . Double mastectomy    . Knee surgery  2004    RIGHT  . Cataract extraction    . Cholecystectomy    . Mastectomy  1996    right breast for breast cancer  . Mastectomy  1/02    left - Dr Lucia Gaskins  . Total knee arthroplasty  6/04    right - Dr Telford Nab  . Breast surgery     Family History  Problem Relation Age of Onset  . Kidney cancer Sister   . Cancer Sister     breast  . Heart attack Mother   . Ulcers Father   . Heart disease Brother     CABG  . Asthma Brother   . Breast cancer Daughter   . Cancer Daughter     breast   Social History  Substance Use Topics  . Smoking status: Never Smoker   . Smokeless tobacco: Never Used  . Alcohol Use: No   OB History    No data available     Review of Systems  Ten systems are reviewed and are negative for acute change except as noted in the HPI  Allergies  Iohexol  Home Medications   Prior to Admission medications   Medication Sig Start Date End Date Taking? Authorizing Provider  acetaZOLAMIDE (DIAMOX) 250 MG tablet TAKE 2 TABLETS BY MOUTH DAILY AT 4PM Patient taking differently: TAKE 500 MG BY MOUTH DAILY AT 4PM 07/12/15  Yes Noralee Space, MD  albuterol (PROVENTIL HFA;VENTOLIN HFA) 108 (90 BASE) MCG/ACT inhaler Inhale 1-2 puffs into the lungs every 6 (six) hours as needed for wheezing or shortness of breath. 04/22/12  Yes Noralee Space, MD  allopurinol (ZYLOPRIM) 300 MG tablet TAKE 1 TABLET (300 MG TOTAL) BY MOUTH DAILY. 08/10/15  Yes Noralee Space, MD  aspirin 81 MG tablet Take 81 mg by mouth daily.     Yes Historical Provider, MD  Cholecalciferol (VITAMIN D) 2000 UNITS CAPS Take 2,000 Units by mouth daily.    Yes Historical Provider, MD  furosemide (LASIX) 40 MG tablet TAKE 2 TABLET EVERY MORNING Patient taking differently: TAKE 80 MG BY MOUTH  EVERY MORNING 05/31/15  Yes Noralee Space, MD  loperamide (IMODIUM) 2 MG capsule Take 2 mg by mouth as needed for diarrhea or loose stools.    Yes Historical Provider, MD  Multiple Vitamin (MULTIVITAMIN) capsule Take 1 capsule by mouth daily.     Yes Historical Provider, MD  oxybutynin (DITROPAN-XL) 10 MG 24 hr tablet TAKE 1 TABLET EVERY DAY FOR BLADDER SPASMS Patient taking differently: TAKE 10 MG BY MOUTH EVERY DAY FOR BLADDER SPASMS 05/31/15  Yes Noralee Space, MD  potassium chloride SA (KLOR-CON M20) 20 MEQ tablet Take 1 tablet (20 mEq total) by mouth 2 (two) times daily. 03/25/15  Yes Noralee Space, MD  propranolol (INDERAL) 80 MG tablet TAKE 1/2 TABLET BY MOUTH TWO TIMES DAILY Patient taking differently: TAKE 40 MG BY MOUTH TWO TIMES DAILY 03/07/15  Yes Noralee Space, MD  quinapril (ACCUPRIL) 40 MG tablet TAKE 1 TABLET (40 MG TOTAL) BY MOUTH DAILY. 01/27/15   Yes Noralee Space, MD  traMADol (ULTRAM) 50 MG tablet TAKE 1 TABLET THREE TIMES A DAY AS NEEDED FOR PAIN Patient taking differently: TAKE 50 MG BY MOUTH THREE TIMES A DAY AS NEEDED FOR PAIN 12/27/14  Yes Noralee Space, MD  warfarin (COUMADIN) 5 MG tablet TAKE AS DIRECTED BY COUMADIN CLINIC Patient taking differently: TAKE 5 MG BY MOUTH DAILY EXCEPT TAKE 7.5 MG ON TUESDAY AND SATURDAY 04/18/15  Yes Deboraha Sprang, MD   BP 164/85 mmHg  Pulse 57  Temp(Src) 98 F (36.7 C) (Oral)  Resp 16  SpO2 99% Physical Exam  Constitutional: She appears well-developed and well-nourished. No distress.  HENT:  Head: Normocephalic and atraumatic.  Mouth/Throat: Oropharynx is clear and moist. No oropharyngeal exudate.  Eyes: Conjunctivae are normal. Pupils are equal, round, and reactive to light. Right eye exhibits no discharge. Left eye exhibits no discharge. No scleral icterus.  Neck: No tracheal deviation present.  Cardiovascular: Normal rate, regular rhythm, normal heart sounds and intact distal pulses.  Exam reveals no gallop and no friction rub.   No murmur heard. Pulmonary/Chest: Effort normal and breath sounds normal. No respiratory distress. She has no wheezes. She has no rales. She exhibits no tenderness.  Abdominal: Soft. Bowel sounds are normal. She exhibits no distension and no mass. There is no tenderness. There is no rebound and no guarding.  Genitourinary:     Ecchymosis extending over left side of mons pubis; hematoma in left labial fold. Chaperoned pelvic exam: normal external genitalia otherwise, vulva, vagina, cervix, uterus and adnexa. No blood in vaginal vault.   Musculoskeletal: She exhibits no edema.  Lymphadenopathy:    She has no cervical adenopathy.  Neurological: She is alert. Coordination normal.  Skin: Skin is warm and dry. Ecchymosis noted. No rash noted. She is not diaphoretic. No erythema.     Psychiatric: She has a normal mood and affect. Her behavior is normal.    Nursing note and vitals reviewed.   ED Course  Procedures  Labs Review Labs Reviewed  PROTIME-INR - Abnormal; Notable for the following:    Prothrombin Time 23.9 (*)    INR 2.15 (*)    All other components within normal limits  CBC - Abnormal; Notable for the following:    RBC 3.71 (*)    Hemoglobin 11.5 (*)    All other components within normal limits  BASIC METABOLIC PANEL - Abnormal; Notable for the following:    Sodium 148 (*)    Glucose, Bld 107 (*)  BUN 22 (*)    All other components within normal limits   Imaging Review Ct Abdomen Pelvis Wo Contrast  08/29/2015  CLINICAL DATA:  Left lower quadrant ecchymosis. Small bump on the inside of the vagina with bruising on the outside. Per patient the bump is getting smaller. Pain initially but none reported currently. EXAM: CT ABDOMEN AND PELVIS WITHOUT CONTRAST TECHNIQUE: Multidetector CT imaging of the abdomen and pelvis was performed following the standard protocol without IV contrast. COMPARISON:  11/26/2007 FINDINGS: Subsegmental atelectasis is noted in the lung bases. 5 mm left lower lobe nodule is unchanged and considered benign. Cardiac enlargement is partially visualized. No pleural effusion. There is a punctate calcification in the liver. Extrahepatic biliary dilatation is stable to minimally increased from the prior study, with the common duct measuring up to 1.6 cm in diameter. There is also mild intrahepatic biliary dilatation. The spleen, adrenal glands, and pancreas are unremarkable. Small nonobstructing renal calculi are present bilaterally. 2.3 cm right lower pole renal cyst is unchanged. Colonic diverticulosis is present without evidence of diverticulitis. There is no evidence of bowel obstruction. Mild aortic atherosclerosis is noted. No free fluid or enlarged lymph nodes are identified. The bladder, uterus, and ovaries are grossly unremarkable. There is an approximately 5.2 x 2.7 cm intermediate soft tissue density focus  in the left aspect of the perineum located deep to the labia with mild fat stranding throughout this region extending posteriorly. No well organized, simple appearing fluid collection is identified on this unenhanced examination. L4-5 intervertebral osseous fusion is again seen. IMPRESSION: 1. Focal intermediate density soft tissue in the perineum on the left suggestive of hematoma given history. 2. Nonobstructing bilateral renal calculi. 3. Moderate extrahepatic and mild intrahepatic biliary dilatation, stable to minimally increased from 2009. This may be related to prior cholecystectomy, however consider laboratory correlation. Electronically Signed   By: Logan Bores M.D.   On: 08/29/2015 23:16   I have personally reviewed and evaluated these images and lab results as part of my medical decision-making.  MDM   Final diagnoses:  Hematoma   Patient nontoxic-appearing, vital signs stable. Will CT pelvis to rule out further bleeding.  INR of 2.15. Hemoglobin of 11.5. BMP demonstrates hyponatremia of 148. CT abdomen pelvis without contrast demonstrates focal indeterminant density measuring 5.2 x 2.7 cm in the soft tissue in the perineum on the left suggestive of hematoma. Interestingly demonstrates nonobstructing bilateral renal calculi and moderate extrahepatic and mild intrahepatic biliary dilatation, stable to minimally increased from 2009. This may be related to prior cholecystectomy. Informed patient of results and encouraged primary care follow-up. Discussed case with Dr. Roderic Palau who advised patient to stop Coumadin until she is able to follow-up with her PCP. Patient may be safely discharged home. Discussed reasons for return. Patient to follow-up with primary care provider within one week. Patient in understanding and agreement with the plan.   [place] Lions, PA-C 08/30/15 0005  Milton Ferguson, MD 08/31/15 2005

## 2015-08-29 NOTE — Discharge Instructions (Signed)
Ms. ZAYNA KRANER,  Nice meeting you! Stop taking your coumadin until you are able to see your primary care provider. Please follow-up with your primary care provider within one week. Return to the emergency department if you develop increased abdominal pain, vaginal bleeding. Feel better soon!  S. Wendie Simmer, PA-C

## 2015-08-29 NOTE — ED Notes (Signed)
Pt ambulated to restroom. 

## 2015-08-29 NOTE — ED Notes (Signed)
Pt c/o small bump on inside of vagina since Thursday. Pt sts the bump is inside the vagina, but there is bruising on the outside of her vagina. Pt c/o pain initially but denies any now. Pt sts the bump is getting smaller. Denies discharge. A&Ox4 and ambulatory.

## 2015-08-29 NOTE — ED Notes (Signed)
PA at bedside.

## 2015-08-29 NOTE — ED Notes (Signed)
Pt transported to CT ?

## 2015-08-30 ENCOUNTER — Telehealth: Payer: Self-pay | Admitting: Internal Medicine

## 2015-08-30 NOTE — Telephone Encounter (Signed)
New Message   Pt recently in the ER was advised to come off of coumadin until she meets with Primary care. And that is not until march 28th . Daughter is requting a call back to discuss if it is ok to stay off this long and should she just cancel 08/31/2015 appt with Coumadin

## 2015-08-30 NOTE — Telephone Encounter (Signed)
Reviewed notes from ED.  CT scan showed "focal indeterminant density measuring 5.2 x 2.7 cm in the soft tissue in the perineum on the left suggestive of hematoma".  ED physician suggested holding anticoagulation until follow up with PCP.  She has a CHADSVASc score of 5.  No history of TIA or stroke.  INR was 2.15 in ER and Hgb stable at 11.5.  She has not seen a cardiologist since 2010.  Will send message to Dr. Lenna Gilford to advise on if pt should hold anticoagulation until 3/28 appt with Rexene Edison, NP.

## 2015-08-31 ENCOUNTER — Ambulatory Visit (INDEPENDENT_AMBULATORY_CARE_PROVIDER_SITE_OTHER): Payer: Medicare Other | Admitting: Pharmacist

## 2015-08-31 ENCOUNTER — Telehealth: Payer: Self-pay | Admitting: Pulmonary Disease

## 2015-08-31 DIAGNOSIS — I481 Persistent atrial fibrillation: Secondary | ICD-10-CM

## 2015-08-31 DIAGNOSIS — Z5181 Encounter for therapeutic drug level monitoring: Secondary | ICD-10-CM | POA: Diagnosis not present

## 2015-08-31 DIAGNOSIS — I4891 Unspecified atrial fibrillation: Secondary | ICD-10-CM

## 2015-08-31 DIAGNOSIS — Z7901 Long term (current) use of anticoagulants: Secondary | ICD-10-CM | POA: Diagnosis not present

## 2015-08-31 DIAGNOSIS — I4819 Other persistent atrial fibrillation: Secondary | ICD-10-CM

## 2015-08-31 LAB — POCT INR: INR: 1.9

## 2015-08-31 NOTE — Telephone Encounter (Signed)
Spoke with Gay Filler  She is wanting sooner ov with SN  Appt has already been scheduled for 3/24  She will make pt aware  Nothing further needed

## 2015-08-31 NOTE — Telephone Encounter (Signed)
Spoke with Magda Paganini with Dr. Jeannine Kitten office.  OV moved to 3/24.  Pt will continue to hold Coumadin until then.  Pt aware and LMOM for her daughter.

## 2015-09-02 ENCOUNTER — Encounter: Payer: Self-pay | Admitting: Pulmonary Disease

## 2015-09-02 ENCOUNTER — Ambulatory Visit (INDEPENDENT_AMBULATORY_CARE_PROVIDER_SITE_OTHER): Payer: Medicare Other | Admitting: Pulmonary Disease

## 2015-09-02 VITALS — BP 146/70 | HR 56 | Temp 99.2°F | Ht 63.0 in | Wt 143.4 lb

## 2015-09-02 DIAGNOSIS — I272 Other secondary pulmonary hypertension: Secondary | ICD-10-CM

## 2015-09-02 DIAGNOSIS — Z7901 Long term (current) use of anticoagulants: Secondary | ICD-10-CM | POA: Diagnosis not present

## 2015-09-02 DIAGNOSIS — S3023XA Contusion of vagina and vulva, initial encounter: Secondary | ICD-10-CM

## 2015-09-02 DIAGNOSIS — I481 Persistent atrial fibrillation: Secondary | ICD-10-CM

## 2015-09-02 DIAGNOSIS — I10 Essential (primary) hypertension: Secondary | ICD-10-CM

## 2015-09-02 DIAGNOSIS — I872 Venous insufficiency (chronic) (peripheral): Secondary | ICD-10-CM

## 2015-09-02 DIAGNOSIS — M150 Primary generalized (osteo)arthritis: Secondary | ICD-10-CM

## 2015-09-02 DIAGNOSIS — M159 Polyosteoarthritis, unspecified: Secondary | ICD-10-CM

## 2015-09-02 DIAGNOSIS — R413 Other amnesia: Secondary | ICD-10-CM

## 2015-09-02 DIAGNOSIS — I4819 Other persistent atrial fibrillation: Secondary | ICD-10-CM

## 2015-09-02 NOTE — Patient Instructions (Signed)
Today we updated your med list in our EPIC system...    Continue your current medications the same...  Continue to HOLD the coumadin until you see the Gynecologist on Monday 09/05/15...  I will ask your GYN to call me after that appt and if the hematoma is resolving as expected,    then we will restart your coumadin at a slightly lower dose...  Call for any questions...   Let's plan to keep your regularly scheduled follow up appt w/ me in May.Marland KitchenMarland Kitchen

## 2015-09-02 NOTE — Progress Notes (Signed)
Subjective:    Patient ID: Cheyenne [name], female    DOB: 04-11-1926, 82 y.o.   MRN: [id number]  HPI 82 y/o BF here for a follow up visit... she has multiple medical problems as noted below>> ~  SEE PREV EPIC NOTES FOR OLDER DATA >>    LABS 11/13:  Chems- ok w/ K=3.7 CO2=34 BUN=23 Creat=0.9;  BNP=470...   CXR 5/14 showed cardiomeg, clear lungs, DJD spine, NAD...  LABS 5/14:  Chems- ok x K=3.4;  CBC- ok w/ Hg=12.3;  TSH=1.38;  VitD=31;  BNP=463...  CXR 5/15 showed mild cardiomeg, clear lungs, DJD spine, surg clips right axilla, NAD...  LABS 5/15:  Chems- ok x K=3.4 on K20/d & asked to incr back to Bid;    Ambulate on RA:  96% O2sat on RA at rest w/ HR=52;  87% on RA after 1lap w/ HR=109 => continue same O2 therapy...   LABS 11/15:  BMet- ok w/ K=4.1, HCO3=33, BUN=16, Cr=0.9.Marland Kitchen.  ~  Nov 03, 2014:  95mo ROV & Cheyenne [name] has had a stable interval- CC is itching on her back, no rash, no new meds etc; states her breathing is ok, DOE w/o change, no CP, min cough & Hycodan prn helps; weight is down 5# to 151# & less edema, reminded to elim all salt from her diet... We reviewed the following medical problems during today's office visit >>     Hypoxemia, PulmHTN> she has HomeO2 (not using it all the time), ProairHFA prn, & Hycodan cough syrup; she feels her breathing is at baseline; last 2DEcho 5/13 w/ PAsys~50 & stable...    HBP> on ASA81, Propran80-1/2Bid, Quinapril40, Lasix40-2Qam, Diamox250-2Qpm, & K20Bid; BP= 118/70 & she denies CP, palpit, ch in SOB, & decr edema w/ 5# wt loss.    AFib, SSS, Tachy-brady> on Coumadin (via CC); seen by DrKlein for Cards/EP in the past...    Venous Insuffic & edema> she had incr ankle edema & improved w/ elim sodium, elev legs, support hose & continue same diuretics...    DM> on diet alone; wt is down to 151# today; BS has been normal ~100...    GI- Divertics, IBS, Polyps> on Immodium prn; she saw DrStark 12/13> IBS, hx divertics & colon polyps, he did not rec  surveillance colonoscopy for her...    GU- bladder symptoms> she has freq & urge/stress incont- trial Oxybutynin10mg  & eval by Urology DrManny> rec changing diuretics if poss but it is not poss...    Hx bilat breast cancers> s/p mastectomies in 1996 & 2002, followed by [facility] & doing well w/o known recurrence...    DJD> on Allopurinol 300, Tramadol50; stable w/ mod DJD & limited mobility (knees, hips)...    Early dementia, anxiety> Aware- family helps w/ her meds & helps at home... We reviewed prob list, meds, xrays and labs> see below for updates >> she is due for PREVNAR-13 shot today...  CXR 5/16 showed mild cardiomeg, clear lungs, no effusion, NAD...   LABS 5/16>  Chems- wnl w/ HCO3=34, Cr=0.90;  CBC- wnl w/ Hg=12.1, Fe=65 (22%sat), Ferritin=113;  TSH=2.65...   IMP/PLAN >> Cheyenne [name] is reasonably stable at age 78- given Prevnar-59 today, meds refilled by request, same meds...   ~  May 10, 2015:  33mo ROV & Cheyenne [name] reports doing well- no new complaints or concerns; Epic review shows several calls for refill Hycodan cough syrup (says she uses it for sleep!=> rec to try TylenolPM & Delsym, not the Hydrocodon);  She says her breathing is improved,  denies SOB/ cough/ sput, CP, palpit, etc;  She uses her O2 w/ activity & she exercises w/ Silver Sneakers once per wk & exercise bike at home...    Hypoxemia, PulmHTN> she has HomeO2 (not using it now), ProairHFA prn, & prev Hycodan cough syrup; she feels her breathing is at baseline; last 2DEcho 5/13 w/ PAsys~50 & stable...    HBP> on ASA81, Propran80-1/2Bid, Accupril40, Lasix40-2Qam, Diamox250-2Qpm, & K20Bid; BP= 134/76 & she denies CP, palpit, ch in SOB, & decr edema w/ 5# wt loss.    AFib, SSS, Tachy-brady> on Coumadin (via CC); seen by DrKlein for Cards/EP in the past; denies CP, palpit, change in SOB/edema...    Venous Insuffic & edema> she had incr ankle edema & improved w/ elim sodium, elev legs, support hose & continue same diuretics...     DM> on diet alone; wt is down to 144# today; BS has been normal ~100...    GI- Divertics, IBS, Polyps> on Immodium prn; she saw DrStark 12/13> IBS, hx divertics & colon polyps, he did not rec surveillance colonoscopy for her...    GU- bladder symptoms> she has freq & urge/stress incont- trial Oxybutynin10mg  & eval by Urology DrManny> rec changing diuretics if poss but it is not poss...    Hx bilat breast cancers> s/p mastectomies in 1996 & 2002, followed by [facility] & doing well w/o known recurrence...    DJD> on Allopurinol 300, Tramadol50, VitD2000; stable w/ mod DJD & limited mobility (knees, hips); she saw DrZSmith for neck pain, DDD, musc spasm, decr ROM- given exercises, ice/heat, Pennsaid, Tylenol, VitD rx.    Early dementia, anxiety> Aware- family helps w/ her meds & helps at home... EXAM shows Afeb, VSS, O2sat=96% on RA at rest;  Heent- neg, mallampati2;  Chest- clear w/o w/r/r; Heart- RR gr1/6 w/o r/g; Abd- soft, nontender, neg; Ext- VI, trace edema; Neuro- memory prob, no focal abn... IMP/PLAN>>  Cheyenne [name] is stable, same meds, OK 2016 Flu vaccine today, we plan ROV 18mo...   ~  September 02, 2015:  62mo ROV & add-on appt requested post ER visit> Cheyenne [name] went to the ER 08/29/15 w/ a "bump" on her vagina- it had been many yrs since she saw a Gyn, note made of her AFib hx and coumadin clinic rx; they noted bruising in her lower abd wall towards the left side & in her vaginal area- initially w/ some mild discomfort but no severe pain or bleeding; she denied any injury but later mentioned her exercise bike w/ uncomfortable seat that comes to a point near her pubis (she rides for 1H nightly while watching Cheyenne [name]!);  She was evaluated by DrZammit including EXAM that showed neg abd exam x ecchymosis over lower left abd wall and mons pubis, +hematoma described in left labial fold, remainder of exam neg;  LABS showed Hg=11.5, Protime=23.9/ INR=2.15, CT Abd&Pelvis showed 5.2 x 2.7 cm intermediate soft tissue  density in the left perineum deep to the labia suggesting a hematoma;  Coumadin was held & she was asked to follow up w/ Gyn regarding the hematoma & here regarding the coumadin rx...    She has held the Coumadin since 3/20, not riding the bike since then either, and she notes that the bruising is slowly diminishing, the "knot" seems to be getting smaller, bruising about the same- she denies any pain at present;  Plan is to continue off Coumadin until her Gyn check on 09/05/15, and as long as the deep hematoma appears to be resolving  then we will resume her anticoag on a sl lower dose to start... EXAM shows Afeb, VSS, O2sat=97% on 2L/min pulse dose;  HEENT- neg, mallampati2;  Chest- clear w/o w/r/r;  Heart- irreg Afib, rate60, gr1/6 SEM w/o r/g;  Abd- soft nontender, bruising in LLQ;  Ext= VI, trace edema;  Neuro- nonfocal... IMP/PLAN>>  Perineum hematoma likely from exercise bike seat trauma + her Coumadin therapy for AFib;  She appears stable off the Coumadin for the past 4d & notes that there is no pain & the "knot" appears to be getting smaller;  Plan is to remain off Coumadin until her Gyn check on 3/27 & if appears satis then we will rec resuming her anticoag rx at that time...            Problem List:  ALLERGIC RHINITIS (ICD-477.9) - on FLONASE Qhs & ZYRTEK QAM Prn.  Hx of HYPOXEMIA (ICD-799.02) - full eval for hypoxemia in [place] by 2DEcho ~  7/08:  CXR= cardiomegaly, ectatic Ao, pulm ven HTN... CTAngio= neg, without PE, min scarring in lingula... 2DEcho= norm LVF w/ EF=55%, LVwall thickness at upper limits, mild ca++ AoV w/ mild AI,  PA sys pressure = 64mmHg... O2 sats 90-94 at rest on RA... w/ ambulation in the office sats drop to 85%... she didn't yet want to go on oxygen at home... just occas SOB w/ activity- she is too sedentary and needs to incr her exercise program. ~  CTChest 6/09 showed bilat mastectomies, no adenopathy, no fluid, 2 sm nodules 4-8mm  size RML/LLL, NAD... ~  6/10: c/o increased DOE & no energy- O2 sat= 91% rest & 86% w/activ... rec> home O2 1L/min rest, 2L/min exercise. ~  4/11 hosp> V/Q scan w/ normal vent & norm perfusion... CXR 4/11 showed cardiomeg, clear lungs, no CHF, surg clips in right axilla... ~  8/11:  pt requesting change to LiqO2 to incr mobility. ~  11/12:  O2 sat= 94% at rest on 2L/min oxygen via [place]... remains stable. ~  11/13:  O2 sat= 100% on 2L/min at rest... ~  CXR 5/14 showed cardiomeg, clear lungs, DJD spine, NAD ~  99991111:  O2 recert today> O2 sat on RA at rest=96% w/ HR=65/min;  Ambulated 3 laps on RA w/ nadir O2 sat=88% w/ HR=116; Rec to continue O2 w/ exercise & Qhs... ~  CXR 5/15 showed mild cardiomeg, clear lungs, DJD spine, surg clips right axilla, NAD.Marland Kitchen. ~  5/15: Ambulate on RA:  96% O2sat on RA at rest w/ HR=52;  87% ob RA after 1lap w/ HR=109 => continue same O2 therapy ~  11/15: she remains on O2 at 2-3L/min flow; O2sats on 3L/min today = 98%... ~  5/16: she has port O2 at home but states not using it now; O2sat=94% on RA at rest today...  ~  11/16: she remains on O2 at 2L/min pulse dose w/ exercise...  HYPERTENSION (ICD-401.9) - controlled on PROPRANOLOL 80mg Bid, QUINAPRIL 40mg /d; and back on LASIX 40mg AM, KCL 74mEqAM, & ACETAZOLAMIDE 250mg - 2tabsPM... she knows to avoid sodium etc... ~  labs 2/09 showed Na143, K3.6, CL101, CO2 36, BUN 15, Cr0.9.Marland KitchenMarland Kitchen BNP was 194... ~  labs 6/09 showed lytes normal x TCO2= 37, & BNP= 512... ~  labs 12/09 showed HCO3- 36,  BNP= 586... rec- same meds, no salt! ~  labs 6/10 showed Na= 146, K= 3.6, CO2= 36, BUN= 14, Creat= 0.9, BNP= 336. ~  labs 9/10 showed Na-147, K=3.7, HCO3=36, BUN=18, Creat=0.9, BNP=484 ~  labs 1/11  showed Na=144, K=3.9, HCO3=35, BUN=17, Creat=0.9, BNP=488 ~  [facility] 4/11 & meds changed by TH> off diuretics & KCl... CXR showed cardiomeg, clear lungs, clips right axilla. ~  labs 10/15/09 showed Na=143, K=4.0, HCO3=37, BUN=12, Creat=0.8, BNP=761...  restart Lasix, Diamox, KCl (one each). ~  labs 10/25/09 showed HCO3=44, BNP=588... rec> incr Diamox to 2Qpm... ~  labs 8/11 showed HCO3=39, BNP=481... continue same. ~  Labs 5/12 showed HCO3=35, BNP=373... Improved, continue same Rx. ~  Labs 11/12 showed HCO3=37, BNP=351... Stable continue same meds. ~  5/13: BP= 138/88 & HCO3=39, BUN=17, Creat=0.9, BNP=534... Reminded to take meds daily. ~  11/3: on ASA81, Propran80Bid, Quinapril40, Lasix40, Diamox250-2/d, & K20; BP= 128/82 & she denies CP, palpit, ch in SOB or edema...  ~  5/14: on ASA81, Propran80Bid, Quinapril40, Lasix40, Diamox250-2/d, & K20Bid; BP= 136/80 & she denies CP, palpit, ch in SOB or edema ~  11/14: on ASA81, Propran80-1/2Bid, Quinapril40, Lasix40, Diamox250-2/d, & K20Bid; BP= 142/80 & she remains essentially asymptomatic... ~  5/15: on ASA81, Propran80-1/2Bid, Quinapril40, Lasix40, Diamox250-2/d, & K20Bid; BP= 144/80 & she denies CP, palpit, ch in SOB, but has developed ankle edema & 5# wt gain ~  11/15: on ASA81, Propran80-1/2Bid, Quinapril40, Lasix40, Diamox250-2/d, & K20Bid; BP= 120/80 & she remains minimally symptomatic but minimally active as well... ~  5/16: on ASA81, Propran80-1/2Bid, Quinapril40, Lasix40-2Qam, Diamox250-2Qpm, & K20Bid; BP= 118/70 & she denies CP, palpit, ch in SOB, & decr edema w/ 5# wt loss.  ATRIAL FIBRILLATION (ICD-427.31) & SICK SINUS/ TACHY-BRADY SYNDROME (ICD-427.81) - new prob 1/10 w/ eval by DrKlein- rate control strategy on Propranolol w/ [facility]. ~  7/08:  2DEcho= norm LVF w/ EF=55%, LVwall thickness at upper limits, mild ca++ AoV w/ mild AI,  PA sys pressure = 60mmHg... ~  EKG 1/10 showed AFib, rate~ 60, NSSTTWA... ~  2DEcho 3/10 showed norm LVF w/ EF= 60% & no regional wall motion abn, mild calcif AoV w/ mild AI, mild LA & RA dil w/ incr thickness of interatrial septum c/w lipomatous hypertrophy, PA sys est at 50... ~  Holter monitor 3/10 = AFib w/ rate 45-97... ~  2DEcho  4/11 in hosp showed norm LVwall thickness & EF=55%, mild RVdil w/ mod decr RV function, mod TR & PAsys~69... ~  2DEcho 5/13 showed normal LVF w/ EF=55%, mild AI & MR, mild LA & RA dilatation, PAsys=49... Stable. ~  Stable on Coumadin & followed by DrKlein w/ rate control strategy...  PULMONARY HYPERTENSION >> Serial 2DEchos showed +- stable PA sys estimates (see above). ~  V/Q lung scan 4/11 was neg for PE... ~  2DEcho 5/13 showed normal LVF w/ EF=55%, mild AI & MR, mild LA & RA dilatation, PAsys=49... Stable. ~  She is way too sedentary but not motivated to exercise etc... ~  Subsequently started exercising on a stationary bike at home...  VENOUS INSUFFICIENCY (ICD-459.81) - mild persist edema despite the sodium restriction, Lasix and Diamox... reviewed recommendation for No Salt, Elevation, TED's, etc... ~  11/15: she presented w/ 10# further wt gain and 4+edema in feet; BMet was OK & we decided to incr Lasix40 to 2tabsQam, continue other meds 7 recheck labs in 6wks... ~  6/15: she had incr ankle edema & improved w/ elim sodium, elev legs, support hose & continue same diuretics  DIABETES MELLITUS, BORDERLINE (ICD-790.29) - prev on Metformin 500mg /d (held after 4/11 hosp)... ~  last BS=181, HgA1c=6.4 in 11/08... ~  labs 6/09 showed BS= 101, HgA1c= 6.4 ~  labs 12/09  showed BS= 139,  HgA1c= 6.1 ~  labs 6/10 showed BS= 123, A1c= 6.1 ~  7/10: neg ophthal f/u DrHecker- no retinopathy, no macular edema. ~  labs 9/10 showed BS= 93 ~  labs 1/11 showed BS=146, A1c=5.7 ~  labs 5/11 showed BS= 100 on diet alone... ~  labs 8/11 showed BS= 96 ~  Labs 5/12 showed BS= 113, A1c= 5.8 ~  9/12:  Ophthalmology check by DrHecker- no DM retinopathy... ~  Labs 11/12 showed BS= 104 ~  Labs 5/13 showed BS= 118 ~  Labs 11/13 showed BS= 97 ~  2/14: she had a neg Ophthalmology eval by DrHecker... ~  Labs 5/14 showed BS= 102 on diet alone... ~  On diet alone, BS ~100 and stable...  DIVERTICULOSIS OF COLON,  IRRITABLE BOWEL SYNDROME & COLONIC POLYPS (ICD-211.3) ~  colonoscopy was 11/06 by DrStark showing divertics and several polyps (one 96mm adenoma)... f/u 61yrs. ~  colonoscopy 1/10 by DrStark showed divertics (severe in sigmoid), 85mm polyp= tub adenoma... f/u 3 yrs. ~  12/13:  She had GI f/u DrStark> IBS w/ freq loose stools, occas BRB, hx extensive divertics & adenomatous polyps, on coumadin for AFib;   URINARY FREQ&URGENCY >>  ~  11/13: she had a GU eval by DrManny> c/o freq & urgency assoc w/ Lasix, some leakage & managing w/ one pad/d, some better w/ Oxybutynin... ~  5/14: she had f/u DrManny, Urology> urinary freq & urgency; worse w/ Lasix; mild leakage & uses 1 pad/d; Oxybutynin caused dry mouth; situation is acceptable & no further meds rec...  BILAT BREAST CANCERS - resected via mastectomies in 1996 and 2002... followed by [facility] annually & seen 8/12> note reviewed. ~  1/15: she had her yearly f/u by [facility]- his note is reviewed, stable, wound over sternum is resolved...   DEGENERATIVE JOINT DISEASE (ICD-715.90) - w/ hx hyperuricemia on ALLOPURINOL 300mg /d & has TRAMADOL 50mg  prn pain...  Hx of HEADACHE (ICD-784.0) ~  MRI Brain 4/11 showed atrophy & sm vessel dis, degen changes at C1-2 w/ some sp stenosis caused part by ant slip of the C1 ring, incidental part empty sella...  ANXIETY (ICD-300.00) - prev rec to take Alpraz 0,5mg  Prn but she never filled the Rx. ~  Remeron 7.5mg /d started 4/11 hosp & stopped 5/11 due to lethargy, sleepiness...   Past Surgical History  Procedure Laterality Date  . Double mastectomy    . Knee surgery  2004    RIGHT  . Cataract extraction    . Cholecystectomy    . Mastectomy  1996    right breast for breast cancer  . Mastectomy  1/02    left - Dr Lucia Gaskins  . Total knee arthroplasty  6/04    right - Dr Telford Nab  . Breast surgery      Outpatient Encounter Prescriptions as of 09/02/2015  Medication Sig  . acetaZOLAMIDE (DIAMOX) 250 MG tablet TAKE  2 TABLETS BY MOUTH DAILY AT 4PM (Patient taking differently: TAKE 500 MG BY MOUTH DAILY AT 4PM)  . albuterol (PROVENTIL HFA;VENTOLIN HFA) 108 (90 BASE) MCG/ACT inhaler Inhale 1-2 puffs into the lungs every 6 (six) hours as needed for wheezing or shortness of breath.  . allopurinol (ZYLOPRIM) 300 MG tablet TAKE 1 TABLET (300 MG TOTAL) BY MOUTH DAILY.  Marland Kitchen aspirin 81 MG tablet Take 81 mg by mouth daily.    . Cholecalciferol (VITAMIN D) 2000 UNITS CAPS Take 2,000 Units by mouth daily.   . furosemide (LASIX) 40 MG tablet TAKE 2  TABLET EVERY MORNING (Patient taking differently: TAKE 80 MG BY MOUTH EVERY MORNING)  . loperamide (IMODIUM) 2 MG capsule Take 2 mg by mouth as needed for diarrhea or loose stools.   . Multiple Vitamin (MULTIVITAMIN) capsule Take 1 capsule by mouth daily.    Marland Kitchen oxybutynin (DITROPAN-XL) 10 MG 24 hr tablet TAKE 1 TABLET EVERY DAY FOR BLADDER SPASMS (Patient taking differently: TAKE 10 MG BY MOUTH EVERY DAY FOR BLADDER SPASMS)  . potassium chloride SA (KLOR-CON M20) 20 MEQ tablet Take 1 tablet (20 mEq total) by mouth 2 (two) times daily.  . propranolol (INDERAL) 80 MG tablet TAKE 1/2 TABLET BY MOUTH TWO TIMES DAILY (Patient taking differently: TAKE 40 MG BY MOUTH TWO TIMES DAILY)  . quinapril (ACCUPRIL) 40 MG tablet TAKE 1 TABLET (40 MG TOTAL) BY MOUTH DAILY.  . traMADol (ULTRAM) 50 MG tablet TAKE 1 TABLET THREE TIMES A DAY AS NEEDED FOR PAIN (Patient taking differently: TAKE 50 MG BY MOUTH THREE TIMES A DAY AS NEEDED FOR PAIN)  . warfarin (COUMADIN) 5 MG tablet TAKE AS DIRECTED BY COUMADIN CLINIC (Patient not taking: Reported on 09/02/2015)   No facility-administered encounter medications on file as of 09/02/2015.    Allergies  Allergen Reactions  . Iohexol      Desc: IV History sheet from prior CT states IV Contrast allergy- 13 hour prep given.     Immunization History  Administered Date(s) Administered  . H1N1 06/07/2008  . Influenza Split 04/24/2011, 04/22/2012  .  Influenza Whole 03/08/2009, 06/26/2010  . Influenza,inj,Quad PF,36+ Mos 04/28/2013, 04/30/2014, 05/10/2015  . Pneumococcal Conjugate-13 11/09/2014  . Pneumococcal Polysaccharide-23 10/10/2009    Current Medications, Allergies, Past Medical History, Past Surgical History, Family History, and Social History were reviewed in [employer] record.    Review of Systems         See HPI - all other systems neg except as noted...  The patient complains of dyspnea on exertion and peripheral edema.  The patient denies anorexia, fever, weight loss, weight gain, vision loss, decreased hearing, hoarseness, chest pain, syncope, prolonged cough, headaches, hemoptysis, abdominal pain, melena, hematochezia, severe indigestion/heartburn, hematuria, incontinence, muscle weakness, suspicious skin lesions, transient blindness, difficulty walking, depression, unusual weight change, abnormal bleeding, enlarged lymph nodes, and angioedema.     Objective:   Physical Exam      WD, WN, 82 y/o BF in NAD... GENERAL:  Alert & oriented; pleasant & cooperative... HEENT:  [place]/AT, EOM-wnl, PERRLA, EACs-clear, TMs-wnl, NOSE-clear, THROAT-clear & wnl. NECK:  Supple w/ fairROM; no JVD; normal carotid impulses w/o bruits; no thyromegaly or nodules palpated; no lymphadenopathy. CHEST:  Clear to P & A; without wheezes/ rales/ or rhonchi heard... HEART:  irregular rhythm; gr 1/6 SEM without rubs or gallops appreciated... ABDOMEN:  Soft & nontender; normal bowel sounds; no organomegaly or masses detected.    Bruising noted onver LLQ down towards the mons pubis EXT: without deformities, mild arthritic changes; no varicose veins/ +venous insuffic/ tr+edema today... NEURO:  CN's intact; no focal neuro deficits... DERM:  There is a rounded lesion left calf c/w ringworm...  RADIOLOGY DATA:  Reviewed in the EPIC EMR & discussed w/ the patient...  LABORATORY DATA:  Reviewed in the EPIC EMR & discussed w/ the  patient...   Assessment & Plan:    PERINEUM HEMATOMA, likely due to exercise bike seat trauma + her coumadin therapy> Coumadin on hold til she can be checked by Gyn; needs a better more comfortable bike seat!  HYPOXEMIA & CO2 retention>  Stable on her O2 (but only using it prn) & w/ diuretic regimen including Acetazolamide, continue same... PULM HYPERTENSION ?Etiology>  We rechecked 2DEcho 5/13 & everything looks stable at age 75 compared to 46yrs prev, continue same meds for now.  HBP>  Controlled on Propranolol, Quinapril, Lasix, & Diamox; continue same...  AFib>  Stable on rate control strategy, no apparent prob w/ tachy or brady; continue same meds...  Ven Insuffic & Edema>  She is on Lasix40-2Qam, Diamox250-2Qpm, K20Bid; Chems are ok & reminded NO SALT, elevate, TED hose etc...  DM>  Well maintained on diet Rx...  GI>  Divertics, IBS, Polyps>  Stable, we discussed Simethacone Rx for gas...  GU> trial Oxybutynin10 for bladder symptoms and refer to Urology per daugh request...  Hx bilat breast cancers>  Stable, followed by [facility], no known recurrence... Skin lesion on chest wall resolved w/ Rx from [facility]...  DJD>  Hx hyperuricemia on Allopurinol Rx & no clinical attacks...  Anxiety>  Stable on Prn Alprazolam Rx...   Patient's Medications  New Prescriptions   No medications on file  Previous Medications   ACETAZOLAMIDE (DIAMOX) 250 MG TABLET    TAKE 2 TABLETS BY MOUTH DAILY AT 4PM   ALBUTEROL (PROVENTIL HFA;VENTOLIN HFA) 108 (90 BASE) MCG/ACT INHALER    Inhale 1-2 puffs into the lungs every 6 (six) hours as needed for wheezing or shortness of breath.   ALLOPURINOL (ZYLOPRIM) 300 MG TABLET    TAKE 1 TABLET (300 MG TOTAL) BY MOUTH DAILY.   ASPIRIN 81 MG TABLET    Take 81 mg by mouth daily.     CHOLECALCIFEROL (VITAMIN D) 2000 UNITS CAPS    Take 2,000 Units by mouth daily.    FUROSEMIDE (LASIX) 40 MG TABLET    TAKE 2 TABLET EVERY MORNING   LOPERAMIDE (IMODIUM) 2 MG  CAPSULE    Take 2 mg by mouth as needed for diarrhea or loose stools.    MULTIPLE VITAMIN (MULTIVITAMIN) CAPSULE    Take 1 capsule by mouth daily.     OXYBUTYNIN (DITROPAN-XL) 10 MG 24 HR TABLET    TAKE 1 TABLET EVERY DAY FOR BLADDER SPASMS   POTASSIUM CHLORIDE SA (KLOR-CON M20) 20 MEQ TABLET    Take 1 tablet (20 mEq total) by mouth 2 (two) times daily.   PROPRANOLOL (INDERAL) 80 MG TABLET    TAKE 1/2 TABLET BY MOUTH TWO TIMES DAILY   QUINAPRIL (ACCUPRIL) 40 MG TABLET    TAKE 1 TABLET (40 MG TOTAL) BY MOUTH DAILY.   TRAMADOL (ULTRAM) 50 MG TABLET    TAKE 1 TABLET THREE TIMES A DAY AS NEEDED FOR PAIN   WARFARIN (COUMADIN) 5 MG TABLET    TAKE AS DIRECTED BY COUMADIN CLINIC  Modified Medications   No medications on file  Discontinued Medications   No medications on file

## 2015-09-06 ENCOUNTER — Inpatient Hospital Stay: Payer: Medicare Other | Admitting: Adult Health

## 2015-09-15 ENCOUNTER — Ambulatory Visit: Payer: Medicare Other | Admitting: Pulmonary Disease

## 2015-10-03 ENCOUNTER — Telehealth: Payer: Self-pay | Admitting: Pulmonary Disease

## 2015-10-03 NOTE — Telephone Encounter (Signed)
Attempted to contact pt. No answer, no option to leave a message. Will try back.  

## 2015-10-04 NOTE — Telephone Encounter (Signed)
Spoke with pt, wants to know when she can start her warfarin back.  Pt states that she was told by SN at last ov to hold her warfarin, wants to know when she should start this again.    Pt also needs a handicap placard.  This has been filled out to best of my ability and placed on SN's cart for signature.  Pt wants this mailed to her, verified address on file.    SN please advise.  Thanks!

## 2015-10-04 NOTE — Telephone Encounter (Signed)
Per SN: at the 3.24.17 visit, said she was to hold coumadin until she was checked by GYN the RESTART the coumadin.  Did she see GYN???  Is the hematoma better?  Okay to restart coumadin as she was previously taking it and needs to see coumadin clinic next week.  Thanks.  LMOM TCB x1.

## 2015-10-05 NOTE — Telephone Encounter (Signed)
lmtcb for pt.  

## 2015-10-06 NOTE — Telephone Encounter (Signed)
Spoke with pt and advised of Dr Jeannine Kitten recommendations.  Pt verbalized understanding .  Nothing further needed.

## 2015-10-12 ENCOUNTER — Ambulatory Visit (INDEPENDENT_AMBULATORY_CARE_PROVIDER_SITE_OTHER): Payer: Medicare Other | Admitting: *Deleted

## 2015-10-12 DIAGNOSIS — I4891 Unspecified atrial fibrillation: Secondary | ICD-10-CM

## 2015-10-12 DIAGNOSIS — I4819 Other persistent atrial fibrillation: Secondary | ICD-10-CM

## 2015-10-12 DIAGNOSIS — Z7901 Long term (current) use of anticoagulants: Secondary | ICD-10-CM | POA: Diagnosis not present

## 2015-10-12 DIAGNOSIS — I481 Persistent atrial fibrillation: Secondary | ICD-10-CM | POA: Diagnosis not present

## 2015-10-12 DIAGNOSIS — Z5181 Encounter for therapeutic drug level monitoring: Secondary | ICD-10-CM

## 2015-10-12 LAB — POCT INR: INR: 2

## 2015-10-18 ENCOUNTER — Other Ambulatory Visit: Payer: Self-pay | Admitting: Pulmonary Disease

## 2015-10-26 ENCOUNTER — Ambulatory Visit (INDEPENDENT_AMBULATORY_CARE_PROVIDER_SITE_OTHER): Payer: Medicare Other | Admitting: *Deleted

## 2015-10-26 DIAGNOSIS — I481 Persistent atrial fibrillation: Secondary | ICD-10-CM | POA: Diagnosis not present

## 2015-10-26 DIAGNOSIS — Z5181 Encounter for therapeutic drug level monitoring: Secondary | ICD-10-CM

## 2015-10-26 DIAGNOSIS — Z7901 Long term (current) use of anticoagulants: Secondary | ICD-10-CM

## 2015-10-26 DIAGNOSIS — I4891 Unspecified atrial fibrillation: Secondary | ICD-10-CM

## 2015-10-26 DIAGNOSIS — I4819 Other persistent atrial fibrillation: Secondary | ICD-10-CM

## 2015-10-26 LAB — POCT INR: INR: 3

## 2015-11-09 ENCOUNTER — Ambulatory Visit (INDEPENDENT_AMBULATORY_CARE_PROVIDER_SITE_OTHER): Payer: Medicare Other | Admitting: Pulmonary Disease

## 2015-11-09 ENCOUNTER — Encounter: Payer: Self-pay | Admitting: Pulmonary Disease

## 2015-11-09 VITALS — BP 110/60 | HR 66 | Temp 98.1°F | Ht 63.0 in | Wt 142.2 lb

## 2015-11-09 DIAGNOSIS — I10 Essential (primary) hypertension: Secondary | ICD-10-CM | POA: Diagnosis not present

## 2015-11-09 DIAGNOSIS — I272 Other secondary pulmonary hypertension: Secondary | ICD-10-CM

## 2015-11-09 DIAGNOSIS — I872 Venous insufficiency (chronic) (peripheral): Secondary | ICD-10-CM | POA: Diagnosis not present

## 2015-11-09 DIAGNOSIS — I4819 Other persistent atrial fibrillation: Secondary | ICD-10-CM

## 2015-11-09 DIAGNOSIS — I481 Persistent atrial fibrillation: Secondary | ICD-10-CM

## 2015-11-09 DIAGNOSIS — R609 Edema, unspecified: Secondary | ICD-10-CM

## 2015-11-09 NOTE — Progress Notes (Signed)
Subjective:    Patient ID: Cheyenne [name], female    DOB: May 19, 1926, 82 y.o.   MRN: [id number]  HPI 82 y/o BF here for a follow up visit... she has multiple medical problems as noted below>> ~  SEE PREV EPIC NOTES FOR OLDER DATA >>    LABS 11/13:  Chems- ok w/ K=3.7 CO2=34 BUN=23 Creat=0.9;  BNP=470...   CXR 5/14 showed cardiomeg, clear lungs, DJD spine, NAD...  LABS 5/14:  Chems- ok x K=3.4;  CBC- ok w/ Hg=12.3;  TSH=1.38;  VitD=31;  BNP=463...  CXR 5/15 showed mild cardiomeg, clear lungs, DJD spine, surg clips right axilla, NAD...  LABS 5/15:  Chems- ok x K=3.4 on K20/d & asked to incr back to Bid;    Ambulate on RA:  96% O2sat on RA at rest w/ HR=52;  87% on RA after 1lap w/ HR=109 => continue same O2 therapy...   LABS 11/15:  BMet- ok w/ K=4.1, HCO3=33, BUN=16, Cr=0.9.Marland Kitchen.  ~  Nov 03, 2014:  81mo ROV & Cheyenne [name] has had a stable interval- CC is itching on her back, no rash, no new meds etc; states her breathing is ok, DOE w/o change, no CP, min cough & Hycodan prn helps; weight is down 5# to 151# & less edema, reminded to elim all salt from her diet... We reviewed the following medical problems during today's office visit >>     Hypoxemia, PulmHTN> she has HomeO2 (not using it all the time), ProairHFA prn, & Hycodan cough syrup; she feels her breathing is at baseline; last 2DEcho 5/13 w/ PAsys~50 & stable...    HBP> on ASA81, Propran80-1/2Bid, Quinapril40, Lasix40-2Qam, Diamox250-2Qpm, & K20Bid; BP= 118/70 & she denies CP, palpit, ch in SOB, & decr edema w/ 5# wt loss.    AFib, SSS, Tachy-brady> on Coumadin (via CC); seen by DrKlein for Cards/EP in the past...    Venous Insuffic & edema> she had incr ankle edema & improved w/ elim sodium, elev legs, support hose & continue same diuretics...    DM> on diet alone; wt is down to 151# today; BS has been normal ~100...    GI- Divertics, IBS, Polyps> on Immodium prn; she saw DrStark 12/13> IBS, hx divertics & colon polyps, he did not rec  surveillance colonoscopy for her...    GU- bladder symptoms> she has freq & urge/stress incont- trial Oxybutynin10mg  & eval by Urology DrManny> rec changing diuretics if poss but it is not poss...    Hx bilat breast cancers> s/p mastectomies in 1996 & 2002, followed by [facility] & doing well w/o known recurrence...    DJD> on Allopurinol 300, Tramadol50; stable w/ mod DJD & limited mobility (knees, hips)...    Early dementia, anxiety> Aware- family helps w/ her meds & helps at home... We reviewed prob list, meds, xrays and labs> see below for updates >> she is due for PREVNAR-13 shot today...  CXR 5/16 showed mild cardiomeg, clear lungs, no effusion, NAD...   LABS 5/16>  Chems- wnl w/ HCO3=34, Cr=0.90;  CBC- wnl w/ Hg=12.1, Fe=65 (22%sat), Ferritin=113;  TSH=2.65...  IMP/PLAN >> Cheyenne [name] is reasonably stable at age 29- given Prevnar-45 today, meds refilled by request, same meds...   ~  May 10, 2015:  58mo ROV & Cheyenne [name] reports doing well- no new complaints or concerns; Epic review shows several calls for refill Hycodan cough syrup (says she uses it for sleep!=> rec to try TylenolPM & Delsym, not the Hydrocodon);  She says her breathing is improved, denies  SOB/ cough/ sput, CP, palpit, etc;  She uses her O2 w/ activity & she exercises w/ Silver Sneakers once per wk & exercise bike at home...    Hypoxemia, PulmHTN> she has HomeO2 (not using it now), ProairHFA prn, & prev Hycodan cough syrup; she feels her breathing is at baseline; last 2DEcho 5/13 w/ PAsys~50 & stable...    HBP> on ASA81, Propran80-1/2Bid, Accupril40, Lasix40-2Qam, Diamox250-2Qpm, & K20Bid; BP= 134/76 & she denies CP, palpit, ch in SOB, & decr edema w/ 5# wt loss.    AFib, SSS, Tachy-brady> on Coumadin (via CC); seen by DrKlein for Cards/EP in the past; denies CP, palpit, change in SOB/edema...    Venous Insuffic & edema> she had incr ankle edema & improved w/ elim sodium, elev legs, support hose & continue same diuretics...    DM>  on diet alone; wt is down to 144# today; BS has been normal ~100...    GI- Divertics, IBS, Polyps> on Immodium prn; she saw DrStark 12/13> IBS, hx divertics & colon polyps, he did not rec surveillance colonoscopy for her...    GU- bladder symptoms> she has freq & urge/stress incont- trial Oxybutynin10mg  & eval by Urology DrManny> rec changing diuretics if poss but it is not poss...    Hx bilat breast cancers> s/p mastectomies in 1996 & 2002, followed by [facility] & doing well w/o known recurrence...    DJD> on Allopurinol 300, Tramadol50, VitD2000; stable w/ mod DJD & limited mobility (knees, hips); she saw DrZSmith for neck pain, DDD, musc spasm, decr ROM- given exercises, ice/heat, Pennsaid, Tylenol, VitD rx.    Early dementia, anxiety> Aware- family helps w/ her meds & helps at home... EXAM shows Afeb, VSS, O2sat=96% on RA at rest;  Heent- neg, mallampati2;  Chest- clear w/o w/r/r; Heart- RR gr1/6 w/o r/g; Abd- soft, nontender, neg; Ext- VI, trace edema; Neuro- memory prob, no focal abn... IMP/PLAN>>  Cheyenne [name] is stable, same meds, OK 2016 Flu vaccine today, we plan ROV 62mo...   ~  September 02, 2015:  50mo ROV & add-on appt requested post ER visit> Cheyenne [name] went to the ER 08/29/15 w/ a "bump" on her vagina- it had been many yrs since she saw a Gyn, note made of her AFib hx and coumadin clinic rx; they noted bruising in her lower abd wall towards the left side & in her vaginal area- initially w/ some mild discomfort but no severe pain or bleeding; she denied any injury but later mentioned her exercise bike w/ uncomfortable seat that comes to a point near her pubis (she rides for 1H nightly while watching [employer]!);  She was evaluated by DrZammit including EXAM that showed neg abd exam x ecchymosis over lower left abd wall and mons pubis, +hematoma described in left labial fold, remainder of exam neg;  LABS showed Hg=11.5, Protime=23.9/ INR=2.15, CT Abd&Pelvis showed 5.2 x 2.7 cm intermediate soft tissue  density in the left perineum deep to the labia suggesting a hematoma;  Coumadin was held & she was asked to follow up w/ Gyn regarding the hematoma & here regarding the coumadin rx...    She has held the Coumadin since 3/20, not riding the bike since then either, and she notes that the bruising is slowly diminishing, the "knot" seems to be getting smaller, bruising about the same- she denies any pain at present;  Plan is to continue off Coumadin until her Gyn check on 09/05/15, and as long as the deep hematoma appears to be resolving then  we will resume her anticoag on a sl lower dose to start... EXAM shows Afeb, VSS, O2sat=97% on 2L/min pulse dose;  HEENT- neg, mallampati2;  Chest- clear w/o w/r/r;  Heart- irreg Afib, rate60, gr1/6 SEM w/o r/g;  Abd- soft nontender, bruising in LLQ;  Ext= VI, trace edema;  Neuro- nonfocal... IMP/PLAN>>  Perineum hematoma likely from exercise bike seat trauma + her Coumadin therapy for AFib;  She appears stable off the Coumadin for the past 4d & notes that there is no pain & the "knot" appears to be getting smaller;  Plan is to remain off Coumadin until her Gyn check on 3/27 & if appears satis then we will rec resuming her anticoag rx at that time...   ~  Nov 09, 2015:  85mo ROV & pulmonary/medical recheck>  Her pelvic hematoma has resolved and she is back on Coumadin- followed closely in the CC, labs reviewed;  She has noted some swelling in feet & ankles, incr DOE, some orthopnea;  She is on Lasix40-2Qam + KCl Bid but she is NOT restricting dietary sodium...     EXAM shows Afeb, VSS, O2sat=92% on 2L/min pulse dose;  HEENT- neg, mallampati2;  Chest- clear w/o w/r/r;  Heart- irreg Afib, rate60, gr1/6 SEM w/o r/g;  Abd- soft nontender, bruising in LLQ;  Ext= VI, 2+ edema... IMP/PLAN>>  Cheyenne [name] is generally stable but has incr edema & needs NO SALT diet, continue Lasix80Qam;  She wants to restart exercise bike & has new seat- so- OK... we plan ROV in 44mo w/ labs etc...            Problem List:  ALLERGIC RHINITIS (ICD-477.9) - on FLONASE Qhs & ZYRTEK QAM Prn.  Hx of HYPOXEMIA (ICD-799.02) - full eval for hypoxemia in [place] by 2DEcho ~  7/08:  CXR= cardiomegaly, ectatic Ao, pulm ven HTN... CTAngio= neg, without PE, min scarring in lingula... 2DEcho= norm LVF w/ EF=55%, LVwall thickness at upper limits, mild ca++ AoV w/ mild AI,  PA sys pressure = 62mmHg... O2 sats 90-94 at rest on RA... w/ ambulation in the office sats drop to 85%... she didn't yet want to go on oxygen at home... just occas SOB w/ activity- she is too sedentary and needs to incr her exercise program. ~  CTChest 6/09 showed bilat mastectomies, no adenopathy, no fluid, 2 sm nodules 4-33mm size RML/LLL, NAD... ~  6/10: c/o increased DOE & no energy- O2 sat= 91% rest & 86% w/activ... rec> home O2 1L/min rest, 2L/min exercise. ~  4/11 hosp> V/Q scan w/ normal vent & norm perfusion... CXR 4/11 showed cardiomeg, clear lungs, no CHF, surg clips in right axilla... ~  8/11:  pt requesting change to LiqO2 to incr mobility. ~  11/12:  O2 sat= 94% at rest on 2L/min oxygen via [place]... remains stable. ~  11/13:  O2 sat= 100% on 2L/min at rest... ~  CXR 5/14 showed cardiomeg, clear lungs, DJD spine, NAD ~  99991111:  O2 recert today> O2 sat on RA at rest=96% w/ HR=65/min;  Ambulated 3 laps on RA w/ nadir O2 sat=88% w/ HR=116; Rec to continue O2 w/ exercise & Qhs... ~  CXR 5/15 showed mild cardiomeg, clear lungs, DJD spine, surg clips right axilla, NAD.Marland Kitchen. ~  5/15: Ambulate on RA:  96% O2sat on RA at rest w/ HR=52;  87% ob RA after 1lap w/ HR=109 => continue same O2 therapy ~  11/15: she remains on O2 at 2-3L/min flow; O2sats on 3L/min today =  98%... ~  5/16: she has port O2 at home but states not using it now; O2sat=94% on RA at rest today...  ~  11/16: she remains on O2 at 2L/min pulse dose w/ exercise...  HYPERTENSION (ICD-401.9) - controlled on PROPRANOLOL 80mg Bid, QUINAPRIL 40mg /d; and  back on LASIX 40mg AM, KCL 64mEqAM, & ACETAZOLAMIDE 250mg - 2tabsPM... she knows to avoid sodium etc... ~  labs 2/09 showed Na143, K3.6, CL101, CO2 36, BUN 15, Cr0.9.Marland KitchenMarland Kitchen BNP was 194... ~  labs 6/09 showed lytes normal x TCO2= 37, & BNP= 512... ~  labs 12/09 showed HCO3- 36,  BNP= 586... rec- same meds, no salt! ~  labs 6/10 showed Na= 146, K= 3.6, CO2= 36, BUN= 14, Creat= 0.9, BNP= 336. ~  labs 9/10 showed Na-147, K=3.7, HCO3=36, BUN=18, Creat=0.9, BNP=484 ~  labs 1/11 showed Na=144, K=3.9, HCO3=35, BUN=17, Creat=0.9, BNP=488 ~  [facility] 4/11 & meds changed by TH> off diuretics & KCl... CXR showed cardiomeg, clear lungs, clips right axilla. ~  labs 10/15/09 showed Na=143, K=4.0, HCO3=37, BUN=12, Creat=0.8, BNP=761... restart Lasix, Diamox, KCl (one each). ~  labs 10/25/09 showed HCO3=44, BNP=588... rec> incr Diamox to 2Qpm... ~  labs 8/11 showed HCO3=39, BNP=481... continue same. ~  Labs 5/12 showed HCO3=35, BNP=373... Improved, continue same Rx. ~  Labs 11/12 showed HCO3=37, BNP=351... Stable continue same meds. ~  5/13: BP= 138/88 & HCO3=39, BUN=17, Creat=0.9, BNP=534... Reminded to take meds daily. ~  11/3: on ASA81, Propran80Bid, Quinapril40, Lasix40, Diamox250-2/d, & K20; BP= 128/82 & she denies CP, palpit, ch in SOB or edema...  ~  5/14: on ASA81, Propran80Bid, Quinapril40, Lasix40, Diamox250-2/d, & K20Bid; BP= 136/80 & she denies CP, palpit, ch in SOB or edema ~  11/14: on ASA81, Propran80-1/2Bid, Quinapril40, Lasix40, Diamox250-2/d, & K20Bid; BP= 142/80 & she remains essentially asymptomatic... ~  5/15: on ASA81, Propran80-1/2Bid, Quinapril40, Lasix40, Diamox250-2/d, & K20Bid; BP= 144/80 & she denies CP, palpit, ch in SOB, but has developed ankle edema & 5# wt gain ~  11/15: on ASA81, Propran80-1/2Bid, Quinapril40, Lasix40, Diamox250-2/d, & K20Bid; BP= 120/80 & she remains minimally symptomatic but minimally active as well... ~  5/16: on ASA81, Propran80-1/2Bid, Quinapril40, Lasix40-2Qam,  Diamox250-2Qpm, & K20Bid; BP= 118/70 & she denies CP, palpit, ch in SOB, & decr edema w/ 5# wt loss.  ATRIAL FIBRILLATION (ICD-427.31) & SICK SINUS/ TACHY-BRADY SYNDROME (ICD-427.81) - new prob 1/10 w/ eval by DrKlein- rate control strategy on Propranolol w/ [facility]mad[facility]. ~  7/08:  2DEcho= norm LVF w/ EF=55%, LVwall thickness at upper limits, mild ca++ AoV w/ mild AI,  PA sys pressure = 19mmHg... ~  EKG 1/10 showed AFib, rate~ 60, NSSTTWA... ~  2DEcho 3/10 showed norm LVF w/ EF= 60% & no regional wall motion abn, mild calcif AoV w/ mild AI, mild LA & RA dil w/ incr thickness of interatrial septum c/w lipomatous hypertrophy, PA sys est at 50... ~  Holter monitor 3/10 = AFib w/ rate 45-97... ~  2DEcho 4/11 in hosp showed norm LVwall thickness & EF=55%, mild RVdil w/ mod decr RV function, mod TR & PAsys~69... ~  2DEcho 5/13 showed normal LVF w/ EF=55%, mild AI & MR, mild LA & RA dilatation, PAsys=49... Stable. ~  Stable on Coumadin & followed by DrKlein w/ rate control strategy...  PULMONARY HYPERTENSION >> Serial 2DEchos showed +- stable PA sys estimates (see above). ~  V/Q lung scan 4/11 was neg for PE... ~  2DEcho 5/13 showed normal LVF w/ EF=55%, mild AI & MR, mild LA &  RA dilatation, PAsys=49... Stable. ~  She is way too sedentary but not motivated to exercise etc... ~  Subsequently started exercising on a stationary bike at home...  VENOUS INSUFFICIENCY (ICD-459.81) - mild persist edema despite the sodium restriction, Lasix and Diamox... reviewed recommendation for No Salt, Elevation, TED's, etc... ~  11/15: she presented w/ 10# further wt gain and 4+edema in feet; BMet was OK & we decided to incr Lasix40 to 2tabsQam, continue other meds 7 recheck labs in 6wks... ~  6/15: she had incr ankle edema & improved w/ elim sodium, elev legs, support hose & continue same diuretics  DIABETES MELLITUS, BORDERLINE (ICD-790.29) - prev on Metformin 500mg /d (held after 4/11 hosp)... ~   last BS=181, HgA1c=6.4 in 11/08... ~  labs 6/09 showed BS= 101, HgA1c= 6.4 ~  labs 12/09 showed BS= 139,  HgA1c= 6.1 ~  labs 6/10 showed BS= 123, A1c= 6.1 ~  7/10: neg ophthal f/u DrHecker- no retinopathy, no macular edema. ~  labs 9/10 showed BS= 93 ~  labs 1/11 showed BS=146, A1c=5.7 ~  labs 5/11 showed BS= 100 on diet alone... ~  labs 8/11 showed BS= 96 ~  Labs 5/12 showed BS= 113, A1c= 5.8 ~  9/12:  Ophthalmology check by DrHecker- no DM retinopathy... ~  Labs 11/12 showed BS= 104 ~  Labs 5/13 showed BS= 118 ~  Labs 11/13 showed BS= 97 ~  2/14: she had a neg Ophthalmology eval by DrHecker... ~  Labs 5/14 showed BS= 102 on diet alone... ~  On diet alone, BS ~100 and stable...  DIVERTICULOSIS OF COLON, IRRITABLE BOWEL SYNDROME & COLONIC POLYPS (ICD-211.3) ~  colonoscopy was 11/06 by DrStark showing divertics and several polyps (one 64mm adenoma)... f/u 67yrs. ~  colonoscopy 1/10 by DrStark showed divertics (severe in sigmoid), 37mm polyp= tub adenoma... f/u 3 yrs. ~  12/13:  She had GI f/u DrStark> IBS w/ freq loose stools, occas BRB, hx extensive divertics & adenomatous polyps, on coumadin for AFib;   URINARY FREQ&URGENCY >>  ~  11/13: she had a GU eval by DrManny> c/o freq & urgency assoc w/ Lasix, some leakage & managing w/ one pad/d, some better w/ Oxybutynin... ~  5/14: she had f/u DrManny, Urology> urinary freq & urgency; worse w/ Lasix; mild leakage & uses 1 pad/d; Oxybutynin caused dry mouth; situation is acceptable & no further meds rec...  BILAT BREAST CANCERS - resected via mastectomies in 1996 and 2002... followed by [facility] annually & seen 8/12> note reviewed. ~  1/15: she had her yearly f/u by [facility]- his note is reviewed, stable, wound over sternum is resolved...   DEGENERATIVE JOINT DISEASE (ICD-715.90) - w/ hx hyperuricemia on ALLOPURINOL 300mg /d & has TRAMADOL 50mg  prn pain...  Hx of HEADACHE (ICD-784.0) ~  MRI Brain 4/11 showed atrophy & sm vessel dis, degen  changes at C1-2 w/ some sp stenosis caused part by ant slip of the C1 ring, incidental part empty sella...  ANXIETY (ICD-300.00) - prev rec to take Alpraz 0,5mg  Prn but she never filled the Rx. ~  Remeron 7.5mg /d started 4/11 hosp & stopped 5/11 due to lethargy, sleepiness...   Past Surgical History  Procedure Laterality Date  . Double mastectomy    . Knee surgery  2004    RIGHT  . Cataract extraction    . Cholecystectomy    . Mastectomy  1996    right breast for breast cancer  . Mastectomy  1/02    left - Dr Lucia Gaskins  .  Total knee arthroplasty  6/04    right - Dr Telford Nab  . Breast surgery      Outpatient Encounter Prescriptions as of 11/09/2015  Medication Sig  . acetaZOLAMIDE (DIAMOX) 250 MG tablet TAKE 2 TABLETS BY MOUTH DAILY AT 4PM (Patient taking differently: TAKE 500 MG BY MOUTH DAILY AT 4PM)  . albuterol (PROVENTIL HFA;VENTOLIN HFA) 108 (90 BASE) MCG/ACT inhaler Inhale 1-2 puffs into the lungs every 6 (six) hours as needed for wheezing or shortness of breath.  . allopurinol (ZYLOPRIM) 300 MG tablet TAKE 1 TABLET (300 MG TOTAL) BY MOUTH DAILY.  Marland Kitchen aspirin 81 MG tablet Take 81 mg by mouth daily.    . Cholecalciferol (VITAMIN D3) 5000 units CAPS Take 1 capsule by mouth daily.  . furosemide (LASIX) 40 MG tablet TAKE 2 TABLET EVERY MORNING (Patient taking differently: TAKE 80 MG BY MOUTH EVERY MORNING)  . loperamide (IMODIUM) 2 MG capsule Take 2 mg by mouth as needed for diarrhea or loose stools.   . Multiple Vitamin (MULTIVITAMIN) capsule Take 1 capsule by mouth daily.    . Multiple Vitamins-Minerals (PRESERVISION AREDS PO) Take 1 tablet by mouth daily.  Marland Kitchen oxybutynin (DITROPAN-XL) 10 MG 24 hr tablet TAKE 1 TABLET EVERY DAY FOR BLADDER SPASMS (Patient taking differently: TAKE 10 MG BY MOUTH EVERY DAY FOR BLADDER SPASMS)  . potassium chloride SA (KLOR-CON M20) 20 MEQ tablet Take 1 tablet (20 mEq total) by mouth 2 (two) times daily.  . propranolol (INDERAL) 80 MG tablet TAKE 1/2  TABLET BY MOUTH TWO TIMES DAILY (Patient taking differently: TAKE 40 MG BY MOUTH TWO TIMES DAILY)  . quinapril (ACCUPRIL) 40 MG tablet TAKE 1 TABLET (40 MG TOTAL) BY MOUTH DAILY.  Marland Kitchen warfarin (COUMADIN) 5 MG tablet TAKE AS DIRECTED BY COUMADIN CLINIC  . traMADol (ULTRAM) 50 MG tablet TAKE 1 TABLET THREE TIMES A DAY AS NEEDED FOR PAIN (Patient not taking: Reported on 11/09/2015)  . [DISCONTINUED] Cholecalciferol (VITAMIN D) 2000 UNITS CAPS Take 2,000 Units by mouth daily.    No facility-administered encounter medications on file as of 11/09/2015.    Allergies  Allergen Reactions  . Iohexol      Desc: IV History sheet from prior CT states IV Contrast allergy- 13 hour prep given.     Immunization History  Administered Date(s) Administered  . H1N1 06/07/2008  . Influenza Split 04/24/2011, 04/22/2012  . Influenza Whole 03/08/2009, 06/26/2010  . Influenza,inj,Quad PF,36+ Mos 04/28/2013, 04/30/2014, 05/10/2015  . Pneumococcal Conjugate-13 11/09/2014  . Pneumococcal Polysaccharide-23 10/10/2009    Current Medications, Allergies, Past Medical History, Past Surgical History, Family History, and Social History were reviewed in [employer] record.    Review of Systems         See HPI - all other systems neg except as noted...  The patient complains of dyspnea on exertion and peripheral edema.  The patient denies anorexia, fever, weight loss, weight gain, vision loss, decreased hearing, hoarseness, chest pain, syncope, prolonged cough, headaches, hemoptysis, abdominal pain, melena, hematochezia, severe indigestion/heartburn, hematuria, incontinence, muscle weakness, suspicious skin lesions, transient blindness, difficulty walking, depression, unusual weight change, abnormal bleeding, enlarged lymph nodes, and angioedema.     Objective:   Physical Exam      WD, WN, 83 y/o BF in NAD... GENERAL:  Alert & oriented; pleasant & cooperative... HEENT:  [place]/AT, EOM-wnl, PERRLA,  EACs-clear, TMs-wnl, NOSE-clear, THROAT-clear & wnl. NECK:  Supple w/ fairROM; no JVD; normal carotid impulses w/o bruits; no thyromegaly or nodules palpated; no  lymphadenopathy. CHEST:  Clear to P & A; without wheezes/ rales/ or rhonchi heard... HEART:  irregular rhythm; gr 1/6 SEM without rubs or gallops appreciated... ABDOMEN:  Soft & nontender; normal bowel sounds; no organomegaly or masses detected.    Bruising noted onver LLQ down towards the mons pubis EXT: without deformities, mild arthritic changes; no varicose veins/ +venous insuffic/ tr+edema today... NEURO:  CN's intact; no focal neuro deficits... DERM:  There is a rounded lesion left calf c/w ringworm...  RADIOLOGY DATA:  Reviewed in the EPIC EMR & discussed w/ the patient...  LABORATORY DATA:  Reviewed in the EPIC EMR & discussed w/ the patient...   Assessment & Plan:    HYPOXEMIA & CO2 retention>  Stable on her O2 (but only using it prn) & w/ diuretic regimen including Acetazolamide, continue same... PULM HYPERTENSION ?Etiology>  We rechecked 2DEcho 5/13 & everything looks stable at age 85 compared to 54yrs prev, continue same meds for now.  HBP>  Controlled on Propranolol, Quinapril, Lasix, & Diamox; continue same...  AFib>  Stable on rate control strategy, no apparent prob w/ tachy or brady; continue same meds...  Ven Insuffic & Edema>  She is on Lasix40-2Qam, Diamox250-2Qpm, K20Bid; Chems are ok & reminded NO SALT, elevate, TED hose etc...  DM>  Well maintained on diet Rx...  GI>  Divertics, IBS, Polyps>  Stable, we discussed Simethacone Rx for gas...  GU> trial Oxybutynin10 for bladder symptoms and refer to Urology per daugh request...  Hx bilat breast cancers>  Stable, followed by  [facility], no known recurrence... Skin lesion on chest wall resolved w/ Rx [facility]...  Hx PERINEUM HEMATOMA, likely due to exercise bike seat trauma + her coumadin therapy> Coumadin on hold til she can be checked by Gyn; needs a  better more comfortable bike seat!  DJD>  Hx hyperuricemia on Allopurinol Rx & no clinical attacks...  Anxiety>  Stable on Prn Alprazolam Rx...   Patient's Medications  New Prescriptions   No medications on file  Previous Medications   ACETAZOLAMIDE (DIAMOX) 250 MG TABLET    TAKE 2 TABLETS BY MOUTH DAILY AT 4PM   ALBUTEROL (PROVENTIL HFA;VENTOLIN HFA) 108 (90 BASE) MCG/ACT INHALER    Inhale 1-2 puffs into the lungs every 6 (six) hours as needed for wheezing or shortness of breath.   ALLOPURINOL (ZYLOPRIM) 300 MG TABLET    TAKE 1 TABLET (300 MG TOTAL) BY MOUTH DAILY.   ASPIRIN 81 MG TABLET    Take 81 mg by mouth daily.     CHOLECALCIFEROL (VITAMIN D3) 5000 UNITS CAPS    Take 1 capsule by mouth daily.   FUROSEMIDE (LASIX) 40 MG TABLET    TAKE 2 TABLET EVERY MORNING   LOPERAMIDE (IMODIUM) 2 MG CAPSULE    Take 2 mg by mouth as needed for diarrhea or loose stools.    MULTIPLE VITAMIN (MULTIVITAMIN) CAPSULE    Take 1 capsule by mouth daily.     MULTIPLE VITAMINS-MINERALS (PRESERVISION AREDS PO)    Take 1 tablet by mouth daily.   OXYBUTYNIN (DITROPAN-XL) 10 MG 24 HR TABLET    TAKE 1 TABLET EVERY DAY FOR BLADDER SPASMS   POTASSIUM CHLORIDE SA (KLOR-CON M20) 20 MEQ TABLET    Take 1 tablet (20 mEq total) by mouth 2 (two) times daily.   PROPRANOLOL (INDERAL) 80 MG TABLET    TAKE 1/2 TABLET BY MOUTH TWO TIMES DAILY   QUINAPRIL (ACCUPRIL) 40 MG TABLET    TAKE 1 TABLET (40 MG TOTAL) BY  MOUTH DAILY.   TRAMADOL (ULTRAM) 50 MG TABLET    TAKE 1 TABLET THREE TIMES A DAY AS NEEDED FOR PAIN   WARFARIN (COUMADIN) 5 MG TABLET    TAKE AS DIRECTED BY COUMADIN CLINIC  Modified Medications   No medications on file  Discontinued Medications   CHOLECALCIFEROL (VITAMIN D) 2000 UNITS CAPS    Take 2,000 Units by mouth daily.

## 2015-11-09 NOTE — Progress Notes (Signed)
Subjective:    Patient ID: Cheyenne [name], female    DOB: 1925-08-13, 82 y.o.   MRN: [id number]  HPI 82 y/o BF here for a follow up visit... she has multiple medical problems as noted below>> ~  SEE PREV EPIC NOTES FOR OLDER DATA >>    LABS 11/13:  Chems- ok w/ K=3.7 CO2=34 BUN=23 Creat=0.9;  BNP=470...   CXR 5/14 showed cardiomeg, clear lungs, DJD spine, NAD...  LABS 5/14:  Chems- ok x K=3.4;  CBC- ok w/ Hg=12.3;  TSH=1.38;  VitD=31;  BNP=463...  CXR 5/15 showed mild cardiomeg, clear lungs, DJD spine, surg clips right axilla, NAD...  LABS 5/15:  Chems- ok x K=3.4 on K20/d & asked to incr back to Bid;    Ambulate on RA:  96% O2sat on RA at rest w/ HR=52;  87% on RA after 1lap w/ HR=109 => continue same O2 therapy...   LABS 11/15:  BMet- ok w/ K=4.1, HCO3=33, BUN=16, Cr=0.9.Marland Kitchen.  ~  Nov 03, 2014:  33mo ROV & Cheyenne [name] has had a stable interval- CC is itching on her back, no rash, no new meds etc; states her breathing is ok, DOE w/o change, no CP, min cough & Hycodan prn helps; weight is down 5# to 151# & less edema, reminded to elim all salt from her diet... We reviewed the following medical problems during today's office visit >>     Hypoxemia, PulmHTN> she has HomeO2 (not using it all the time), ProairHFA prn, & Hycodan cough syrup; she feels her breathing is at baseline; last 2DEcho 5/13 w/ PAsys~50 & stable...    HBP> on ASA81, Propran80-1/2Bid, Quinapril40, Lasix40-2Qam, Diamox250-2Qpm, & K20Bid; BP= 118/70 & she denies CP, palpit, ch in SOB, & decr edema w/ 5# wt loss.    AFib, SSS, Tachy-brady> on Coumadin (via CC); seen by Cheyenne [name] for Cards/EP in the past...    Venous Insuffic & edema> she had incr ankle edema & improved w/ elim sodium, elev legs, support hose & continue same diuretics...    DM> on diet alone; wt is down to 151# today; BS has been normal ~100...    GI- Divertics, IBS, Polyps> on Immodium prn; she saw Cheyenne [name] 12/13> IBS, hx divertics & colon polyps, he did not rec  surveillance colonoscopy for her...    GU- bladder symptoms> she has freq & urge/stress incont- trial Oxybutynin10mg  & eval by Urology Cheyenne [name]> rec changing diuretics if poss but it is not poss...    Hx bilat breast cancers> s/p mastectomies in 1996 & 2002, followed by [facility] & doing well w/o known recurrence...    DJD> on Allopurinol 300, Tramadol50; stable w/ mod DJD & limited mobility (knees, hips)...    Early dementia, anxiety> Aware- family helps w/ her meds & helps at home... We reviewed prob list, meds, xrays and labs> see below for updates >> she is due for PREVNAR-13 shot today...  CXR 5/16 showed mild cardiomeg, clear lungs, no effusion, NAD...   LABS 5/16>  Chems- wnl w/ HCO3=34, Cr=0.90;  CBC- wnl w/ Hg=12.1, Fe=65 (22%sat), Ferritin=113;  TSH=2.65...   IMP/PLAN >> Cheyenne [name] is reasonably stable at age 56- given Prevnar-69 today, meds refilled by request, same meds...   ~  May 10, 2015:  51mo ROV & Cheyenne [name] reports doing well- no new complaints or concerns; Epic review shows several calls for refill Hycodan cough syrup (says she uses it for sleep!=> rec to try TylenolPM & Delsym, not the Hydrocodon);  She says her breathing is improved,  denies SOB/ cough/ sput, CP, palpit, etc;  She uses her O2 w/ activity & she exercises w/ Silver Sneakers once per wk & exercise bike at home...    Hypoxemia, PulmHTN> she has HomeO2 (not using it now), ProairHFA prn, & prev Hycodan cough syrup; she feels her breathing is at baseline; last 2DEcho 5/13 w/ PAsys~50 & stable...    HBP> on ASA81, Propran80-1/2Bid, Accupril40, Lasix40-2Qam, Diamox250-2Qpm, & K20Bid; BP= 134/76 & she denies CP, palpit, ch in SOB, & decr edema w/ 5# wt loss.    AFib, SSS, Tachy-brady> on Coumadin (via CC); seen by Cheyenne [name] for Cards/EP in the past; denies CP, palpit, change in SOB/edema...    Venous Insuffic & edema> she had incr ankle edema & improved w/ elim sodium, elev legs, support hose & continue same diuretics...     DM> on diet alone; wt is down to 144# today; BS has been normal ~100...    GI- Divertics, IBS, Polyps> on Immodium prn; she saw Cheyenne [name] 12/13> IBS, hx divertics & colon polyps, he did not rec surveillance colonoscopy for her...    GU- bladder symptoms> she has freq & urge/stress incont- trial Oxybutynin10mg  & eval by Urology Cheyenne [name]> rec changing diuretics if poss but it is not poss...    Hx bilat breast cancers> s/p mastectomies in 1996 & 2002, followed by [facility] & doing well w/o known recurrence...    DJD> on Allopurinol 300, Tramadol50, VitD2000; stable w/ mod DJD & limited mobility (knees, hips); she saw Cheyenne [name] for neck pain, DDD, musc spasm, decr ROM- given exercises, ice/heat, Pennsaid, Tylenol, VitD rx.    Early dementia, anxiety> Aware- family helps w/ her meds & helps at home... EXAM shows Afeb, VSS, O2sat=96% on RA at rest;  Heent- neg, mallampati2;  Chest- clear w/o w/r/r; Heart- RR gr1/6 w/o r/g; Abd- soft, nontender, neg; Ext- VI, trace edema; Neuro- memory prob, no focal abn... IMP/PLAN>>  Cheyenne [name] is stable, same meds, OK 2016 Flu vaccine today, we plan ROV 18mo...   ~  September 02, 2015:  62mo ROV & add-on appt requested post ER visit> Cheyenne [name] went to the ER 08/29/15 w/ a "bump" on her vagina- it had been many yrs since she saw a Gyn, note made of her AFib hx and coumadin clinic rx; they noted bruising in her lower abd wall towards the left side & in her vaginal area- initially w/ some mild discomfort but no severe pain or bleeding; she denied any injury but later mentioned her exercise bike w/ uncomfortable seat that comes to a point near her pubis (she rides for 1H nightly while watching Cheyenne [name]!);  She was evaluated by Cheyenne [name] including EXAM that showed neg abd exam x ecchymosis over lower left abd wall and mons pubis, +hematoma described in left labial fold, remainder of exam neg;  LABS showed Hg=11.5, Protime=23.9/ INR=2.15, CT Abd&Pelvis showed 5.2 x 2.7 cm intermediate soft tissue  density in the left perineum deep to the labia suggesting a hematoma;  Coumadin was held & she was asked to follow up w/ Gyn regarding the hematoma & here regarding the coumadin rx...    She has held the Coumadin since 3/20, not riding the bike since then either, and she notes that the bruising is slowly diminishing, the "knot" seems to be getting smaller, bruising about the same- she denies any pain at present;  Plan is to continue off Coumadin until her Gyn check on 09/05/15, and as long as the deep hematoma appears to be resolving  then we will resume her anticoag on a sl lower dose to start... EXAM shows Afeb, VSS, O2sat=97% on 2L/min pulse dose;  HEENT- neg, mallampati2;  Chest- clear w/o w/r/r;  Heart- irreg Afib, rate60, gr1/6 SEM w/o r/g;  Abd- soft nontender, bruising in LLQ;  Ext= VI, trace edema;  Neuro- nonfocal... IMP/PLAN>>  Perineum hematoma likely from exercise bike seat trauma + her Coumadin therapy for AFib;  She appears stable off the Coumadin for the past 4d & notes that there is no pain & the "knot" appears to be getting smaller;  Plan is to remain off Coumadin until her Gyn check on 3/27 & if appears satis then we will rec resuming her anticoag rx at that time...            Problem List:  ALLERGIC RHINITIS (ICD-477.9) - on FLONASE Qhs & ZYRTEK QAM Prn.  Hx of HYPOXEMIA (ICD-799.02) - full eval for hypoxemia in [place] by 2DEcho ~  7/08:  CXR= cardiomegaly, ectatic Ao, pulm ven HTN... CTAngio= neg, without PE, min scarring in lingula... 2DEcho= norm LVF w/ EF=55%, LVwall thickness at upper limits, mild ca++ AoV w/ mild AI,  PA sys pressure = 64mmHg... O2 sats 90-94 at rest on RA... w/ ambulation in the office sats drop to 85%... she didn't yet want to go on oxygen at home... just occas SOB w/ activity- she is too sedentary and needs to incr her exercise program. ~  CTChest 6/09 showed bilat mastectomies, no adenopathy, no fluid, 2 sm nodules 4-8mm  size RML/LLL, NAD... ~  6/10: c/o increased DOE & no energy- O2 sat= 91% rest & 86% w/activ... rec> home O2 1L/min rest, 2L/min exercise. ~  4/11 hosp> V/Q scan w/ normal vent & norm perfusion... CXR 4/11 showed cardiomeg, clear lungs, no CHF, surg clips in right axilla... ~  8/11:  pt requesting change to LiqO2 to incr mobility. ~  11/12:  O2 sat= 94% at rest on 2L/min oxygen via [place]... remains stable. ~  11/13:  O2 sat= 100% on 2L/min at rest... ~  CXR 5/14 showed cardiomeg, clear lungs, DJD spine, NAD ~  99991111:  O2 recert today> O2 sat on RA at rest=96% w/ HR=65/min;  Ambulated 3 laps on RA w/ nadir O2 sat=88% w/ HR=116; Rec to continue O2 w/ exercise & Qhs... ~  CXR 5/15 showed mild cardiomeg, clear lungs, DJD spine, surg clips right axilla, NAD.Marland Kitchen. ~  5/15: Ambulate on RA:  96% O2sat on RA at rest w/ HR=52;  87% ob RA after 1lap w/ HR=109 => continue same O2 therapy ~  11/15: she remains on O2 at 2-3L/min flow; O2sats on 3L/min today = 98%... ~  5/16: she has port O2 at home but states not using it now; O2sat=94% on RA at rest today...  ~  11/16: she remains on O2 at 2L/min pulse dose w/ exercise...  HYPERTENSION (ICD-401.9) - controlled on PROPRANOLOL 80mg Bid, QUINAPRIL 40mg /d; and back on LASIX 40mg AM, KCL 74mEqAM, & ACETAZOLAMIDE 250mg - 2tabsPM... she knows to avoid sodium etc... ~  labs 2/09 showed Na143, K3.6, CL101, CO2 36, BUN 15, Cr0.9.Marland KitchenMarland Kitchen BNP was 194... ~  labs 6/09 showed lytes normal x TCO2= 37, & BNP= 512... ~  labs 12/09 showed HCO3- 36,  BNP= 586... rec- same meds, no salt! ~  labs 6/10 showed Na= 146, K= 3.6, CO2= 36, BUN= 14, Creat= 0.9, BNP= 336. ~  labs 9/10 showed Na-147, K=3.7, HCO3=36, BUN=18, Creat=0.9, BNP=484 ~  labs 1/11  showed Na=144, K=3.9, HCO3=35, BUN=17, Creat=0.9, BNP=488 ~  [facility] 4/11 & meds changed by TH> off diuretics & KCl... CXR showed cardiomeg, clear lungs, clips right axilla. ~  labs 10/15/09 showed Na=143, K=4.0, HCO3=37, BUN=12, Creat=0.8, BNP=761...  restart Lasix, Diamox, KCl (one each). ~  labs 10/25/09 showed HCO3=44, BNP=588... rec> incr Diamox to 2Qpm... ~  labs 8/11 showed HCO3=39, BNP=481... continue same. ~  Labs 5/12 showed HCO3=35, BNP=373... Improved, continue same Rx. ~  Labs 11/12 showed HCO3=37, BNP=351... Stable continue same meds. ~  5/13: BP= 138/88 & HCO3=39, BUN=17, Creat=0.9, BNP=534... Reminded to take meds daily. ~  11/3: on ASA81, Propran80Bid, Quinapril40, Lasix40, Diamox250-2/d, & K20; BP= 128/82 & she denies CP, palpit, ch in SOB or edema...  ~  5/14: on ASA81, Propran80Bid, Quinapril40, Lasix40, Diamox250-2/d, & K20Bid; BP= 136/80 & she denies CP, palpit, ch in SOB or edema ~  11/14: on ASA81, Propran80-1/2Bid, Quinapril40, Lasix40, Diamox250-2/d, & K20Bid; BP= 142/80 & she remains essentially asymptomatic... ~  5/15: on ASA81, Propran80-1/2Bid, Quinapril40, Lasix40, Diamox250-2/d, & K20Bid; BP= 144/80 & she denies CP, palpit, ch in SOB, but has developed ankle edema & 5# wt gain ~  11/15: on ASA81, Propran80-1/2Bid, Quinapril40, Lasix40, Diamox250-2/d, & K20Bid; BP= 120/80 & she remains minimally symptomatic but minimally active as well... ~  5/16: on ASA81, Propran80-1/2Bid, Quinapril40, Lasix40-2Qam, Diamox250-2Qpm, & K20Bid; BP= 118/70 & she denies CP, palpit, ch in SOB, & decr edema w/ 5# wt loss.  ATRIAL FIBRILLATION (ICD-427.31) & SICK SINUS/ TACHY-BRADY SYNDROME (ICD-427.81) - new prob 1/10 w/ eval by Cheyenne [name]- rate control strategy on Propranolol w/ [facility]. ~  7/08:  2DEcho= norm LVF w/ EF=55%, LVwall thickness at upper limits, mild ca++ AoV w/ mild AI,  PA sys pressure = 60mmHg... ~  EKG 1/10 showed AFib, rate~ 60, NSSTTWA... ~  2DEcho 3/10 showed norm LVF w/ EF= 60% & no regional wall motion abn, mild calcif AoV w/ mild AI, mild LA & RA dil w/ incr thickness of interatrial septum c/w lipomatous hypertrophy, PA sys est at 50... ~  Holter monitor 3/10 = AFib w/ rate 45-97... ~  2DEcho  4/11 in hosp showed norm LVwall thickness & EF=55%, mild RVdil w/ mod decr RV function, mod TR & PAsys~69... ~  2DEcho 5/13 showed normal LVF w/ EF=55%, mild AI & MR, mild LA & RA dilatation, PAsys=49... Stable. ~  Stable on Coumadin & followed by Cheyenne [name] w/ rate control strategy...  PULMONARY HYPERTENSION >> Serial 2DEchos showed +- stable PA sys estimates (see above). ~  V/Q lung scan 4/11 was neg for PE... ~  2DEcho 5/13 showed normal LVF w/ EF=55%, mild AI & MR, mild LA & RA dilatation, PAsys=49... Stable. ~  She is way too sedentary but not motivated to exercise etc... ~  Subsequently started exercising on a stationary bike at home...  VENOUS INSUFFICIENCY (ICD-459.81) - mild persist edema despite the sodium restriction, Lasix and Diamox... reviewed recommendation for No Salt, Elevation, TED's, etc... ~  11/15: she presented w/ 10# further wt gain and 4+edema in feet; BMet was OK & we decided to incr Lasix40 to 2tabsQam, continue other meds 7 recheck labs in 6wks... ~  6/15: she had incr ankle edema & improved w/ elim sodium, elev legs, support hose & continue same diuretics  DIABETES MELLITUS, BORDERLINE (ICD-790.29) - prev on Metformin 500mg /d (held after 4/11 hosp)... ~  last BS=181, HgA1c=6.4 in 11/08... ~  labs 6/09 showed BS= 101, HgA1c= 6.4 ~  labs 12/09  showed BS= 139,  HgA1c= 6.1 ~  labs 6/10 showed BS= 123, A1c= 6.1 ~  7/10: neg ophthal f/u DrHecker- no retinopathy, no macular edema. ~  labs 9/10 showed BS= 93 ~  labs 1/11 showed BS=146, A1c=5.7 ~  labs 5/11 showed BS= 100 on diet alone... ~  labs 8/11 showed BS= 96 ~  Labs 5/12 showed BS= 113, A1c= 5.8 ~  9/12:  Ophthalmology check by DrHecker- no DM retinopathy... ~  Labs 11/12 showed BS= 104 ~  Labs 5/13 showed BS= 118 ~  Labs 11/13 showed BS= 97 ~  2/14: she had a neg Ophthalmology eval by DrHecker... ~  Labs 5/14 showed BS= 102 on diet alone... ~  On diet alone, BS ~100 and stable...  DIVERTICULOSIS OF COLON,  IRRITABLE BOWEL SYNDROME & COLONIC POLYPS (ICD-211.3) ~  colonoscopy was 11/06 by Cheyenne [name] showing divertics and several polyps (one 22mm adenoma)... f/u 27yrs. ~  colonoscopy 1/10 by Cheyenne [name] showed divertics (severe in sigmoid), 41mm polyp= tub adenoma... f/u 3 yrs. ~  12/13:  She had GI f/u Cheyenne [name]> IBS w/ freq loose stools, occas BRB, hx extensive divertics & adenomatous polyps, on coumadin for AFib;   URINARY FREQ&URGENCY >>  ~  11/13: she had a GU eval by Cheyenne [name]> c/o freq & urgency assoc w/ Lasix, some leakage & managing w/ one pad/d, some better w/ Oxybutynin... ~  5/14: she had f/u Cheyenne [name], Urology> urinary freq & urgency; worse w/ Lasix; mild leakage & uses 1 pad/d; Oxybutynin caused dry mouth; situation is acceptable & no further meds rec...  BILAT BREAST CANCERS - resected via mastectomies in 1996 and 2002... followed by [facility] annually & seen 8/12> note reviewed. ~  1/15: she had her yearly f/u by [facility]- his note is reviewed, stable, wound over sternum is resolved...   DEGENERATIVE JOINT DISEASE (ICD-715.90) - w/ hx hyperuricemia on ALLOPURINOL 300mg /d & has TRAMADOL 50mg  prn pain...  Hx of HEADACHE (ICD-784.0) ~  MRI Brain 4/11 showed atrophy & sm vessel dis, degen changes at C1-2 w/ some sp stenosis caused part by ant slip of the C1 ring, incidental part empty sella...  ANXIETY (ICD-300.00) - prev rec to take Alpraz 0,5mg  Prn but she never filled the Rx. ~  Remeron 7.5mg /d started 4/11 hosp & stopped 5/11 due to lethargy, sleepiness...   Past Surgical History  Procedure Laterality Date  . Double mastectomy    . Knee surgery  2004    RIGHT  . Cataract extraction    . Cholecystectomy    . Mastectomy  1996    right breast for breast cancer  . Mastectomy  1/02    left - Dr 14/02  . Total knee arthroplasty  6/04    right - Dr 19/04  . Breast surgery      Outpatient Encounter Prescriptions as of 11/09/2015  Medication Sig  . acetaZOLAMIDE (DIAMOX) 250 MG tablet TAKE  2 TABLETS BY MOUTH DAILY AT 4PM (Patient taking differently: TAKE 500 MG BY MOUTH DAILY AT 4PM)  . albuterol (PROVENTIL HFA;VENTOLIN HFA) 108 (90 BASE) MCG/ACT inhaler Inhale 1-2 puffs into the lungs every 6 (six) hours as needed for wheezing or shortness of breath.  . allopurinol (ZYLOPRIM) 300 MG tablet TAKE 1 TABLET (300 MG TOTAL) BY MOUTH DAILY.  11/22/2015 aspirin 81 MG tablet Take 81 mg by mouth daily.    . Cholecalciferol (VITAMIN D3) 5000 units CAPS Take 1 capsule by mouth daily.  . furosemide (LASIX) 40 MG tablet TAKE 2 TABLET  EVERY MORNING (Patient taking differently: TAKE 80 MG BY MOUTH EVERY MORNING)  . loperamide (IMODIUM) 2 MG capsule Take 2 mg by mouth as needed for diarrhea or loose stools.   . Multiple Vitamin (MULTIVITAMIN) capsule Take 1 capsule by mouth daily.    . Multiple Vitamins-Minerals (PRESERVISION AREDS PO) Take 1 tablet by mouth daily.  Marland Kitchen oxybutynin (DITROPAN-XL) 10 MG 24 hr tablet TAKE 1 TABLET EVERY DAY FOR BLADDER SPASMS (Patient taking differently: TAKE 10 MG BY MOUTH EVERY DAY FOR BLADDER SPASMS)  . potassium chloride SA (KLOR-CON M20) 20 MEQ tablet Take 1 tablet (20 mEq total) by mouth 2 (two) times daily.  . propranolol (INDERAL) 80 MG tablet TAKE 1/2 TABLET BY MOUTH TWO TIMES DAILY (Patient taking differently: TAKE 40 MG BY MOUTH TWO TIMES DAILY)  . quinapril (ACCUPRIL) 40 MG tablet TAKE 1 TABLET (40 MG TOTAL) BY MOUTH DAILY.  Marland Kitchen warfarin (COUMADIN) 5 MG tablet TAKE AS DIRECTED BY COUMADIN CLINIC  . traMADol (ULTRAM) 50 MG tablet TAKE 1 TABLET THREE TIMES A DAY AS NEEDED FOR PAIN (Patient not taking: Reported on 11/09/2015)  . [DISCONTINUED] Cholecalciferol (VITAMIN D) 2000 UNITS CAPS Take 2,000 Units by mouth daily.    No facility-administered encounter medications on file as of 11/09/2015.    Allergies  Allergen Reactions  . Iohexol      Desc: IV History sheet from prior CT states IV Contrast allergy- 13 hour prep given.     Immunization History  Administered  Date(s) Administered  . H1N1 06/07/2008  . Influenza Split 04/24/2011, 04/22/2012  . Influenza Whole 03/08/2009, 06/26/2010  . Influenza,inj,Quad PF,36+ Mos 04/28/2013, 04/30/2014, 05/10/2015  . Pneumococcal Conjugate-13 11/09/2014  . Pneumococcal Polysaccharide-23 10/10/2009    Current Medications, Allergies, Past Medical History, Past Surgical History, Family History, and Social History were reviewed in [employer] record.    Review of Systems         See HPI - all other systems neg except as noted...  The patient complains of dyspnea on exertion and peripheral edema.  The patient denies anorexia, fever, weight loss, weight gain, vision loss, decreased hearing, hoarseness, chest pain, syncope, prolonged cough, headaches, hemoptysis, abdominal pain, melena, hematochezia, severe indigestion/heartburn, hematuria, incontinence, muscle weakness, suspicious skin lesions, transient blindness, difficulty walking, depression, unusual weight change, abnormal bleeding, enlarged lymph nodes, and angioedema.     Objective:   Physical Exam      WD, WN, 82 y/o BF in NAD... GENERAL:  Alert & oriented; pleasant & cooperative... HEENT:  [place]/AT, EOM-wnl, PERRLA, EACs-clear, TMs-wnl, NOSE-clear, THROAT-clear & wnl. NECK:  Supple w/ fairROM; no JVD; normal carotid impulses w/o bruits; no thyromegaly or nodules palpated; no lymphadenopathy. CHEST:  Clear to P & A; without wheezes/ rales/ or rhonchi heard... HEART:  irregular rhythm; gr 1/6 SEM without rubs or gallops appreciated... ABDOMEN:  Soft & nontender; normal bowel sounds; no organomegaly or masses detected.    Bruising noted onver LLQ down towards the mons pubis EXT: without deformities, mild arthritic changes; no varicose veins/ +venous insuffic/ tr+edema today... NEURO:  CN's intact; no focal neuro deficits... DERM:  There is a rounded lesion left calf c/w ringworm...  RADIOLOGY DATA:  Reviewed in the EPIC EMR & discussed  w/ the patient...  LABORATORY DATA:  Reviewed in the EPIC EMR & discussed w/ the patient...   Assessment & Plan:    PERINEUM HEMATOMA, likely due to exercise bike seat trauma + her coumadin therapy> Coumadin on hold til she can be  checked by Gyn; needs a better more comfortable bike seat!   HYPOXEMIA & CO2 retention>  Stable on her O2 (but only using it prn) & w/ diuretic regimen including Acetazolamide, continue same... PULM HYPERTENSION ?Etiology>  We rechecked 2DEcho 5/13 & everything looks stable at age 79 compared to 67yrs prev, continue same meds for now.  HBP>  Controlled on Propranolol, Quinapril, Lasix, & Diamox; continue same...  AFib>  Stable on rate control strategy, no apparent prob w/ tachy or brady; continue same meds...  Ven Insuffic & Edema>  She is on Lasix40-2Qam, Diamox250-2Qpm, K20Bid; Chems are ok & reminded NO SALT, elevate, TED hose etc...  DM>  Well maintained on diet Rx...  GI>  Divertics, IBS, Polyps>  Stable, we discussed Simethacone Rx for gas...  GU> trial Oxybutynin10 for bladder symptoms and refer to Urology per daugh request...  Hx bilat breast cancers>  Stable, followed by [facility], no known recurrence... Skin lesion on chest wall resolved w/ Rx from [facility]...  DJD>  Hx hyperuricemia on Allopurinol Rx & no clinical attacks...  Anxiety>  Stable on Prn Alprazolam Rx...   Patient's Medications  New Prescriptions   No medications on file  Previous Medications   ACETAZOLAMIDE (DIAMOX) 250 MG TABLET    TAKE 2 TABLETS BY MOUTH DAILY AT 4PM   ALBUTEROL (PROVENTIL HFA;VENTOLIN HFA) 108 (90 BASE) MCG/ACT INHALER    Inhale 1-2 puffs into the lungs every 6 (six) hours as needed for wheezing or shortness of breath.   ALLOPURINOL (ZYLOPRIM) 300 MG TABLET    TAKE 1 TABLET (300 MG TOTAL) BY MOUTH DAILY.   ASPIRIN 81 MG TABLET    Take 81 mg by mouth daily.     CHOLECALCIFEROL (VITAMIN D3) 5000 UNITS CAPS    Take 1 capsule by mouth daily.   FUROSEMIDE  (LASIX) 40 MG TABLET    TAKE 2 TABLET EVERY MORNING   LOPERAMIDE (IMODIUM) 2 MG CAPSULE    Take 2 mg by mouth as needed for diarrhea or loose stools.    MULTIPLE VITAMIN (MULTIVITAMIN) CAPSULE    Take 1 capsule by mouth daily.     MULTIPLE VITAMINS-MINERALS (PRESERVISION AREDS PO)    Take 1 tablet by mouth daily.   OXYBUTYNIN (DITROPAN-XL) 10 MG 24 HR TABLET    TAKE 1 TABLET EVERY DAY FOR BLADDER SPASMS   POTASSIUM CHLORIDE SA (KLOR-CON M20) 20 MEQ TABLET    Take 1 tablet (20 mEq total) by mouth 2 (two) times daily.   PROPRANOLOL (INDERAL) 80 MG TABLET    TAKE 1/2 TABLET BY MOUTH TWO TIMES DAILY   QUINAPRIL (ACCUPRIL) 40 MG TABLET    TAKE 1 TABLET (40 MG TOTAL) BY MOUTH DAILY.   TRAMADOL (ULTRAM) 50 MG TABLET    TAKE 1 TABLET THREE TIMES A DAY AS NEEDED FOR PAIN   WARFARIN (COUMADIN) 5 MG TABLET    TAKE AS DIRECTED BY COUMADIN CLINIC  Modified Medications   No medications on file  Discontinued Medications   CHOLECALCIFEROL (VITAMIN D) 2000 UNITS CAPS    Take 2,000 Units by mouth daily.

## 2015-11-09 NOTE — Patient Instructions (Signed)
Today we updated your med list in our EPIC system...    Continue your current medications the same...  Be sure to eliminate the salt/ sodium from your diet...    Wear the support hose...    Keep your legs elevated...  Call for any questions...  Let's plan a follow up visit in 26mo, sooner if needed for problems.Marland KitchenMarland Kitchen

## 2015-11-16 ENCOUNTER — Ambulatory Visit (INDEPENDENT_AMBULATORY_CARE_PROVIDER_SITE_OTHER): Payer: Medicare Other | Admitting: *Deleted

## 2015-11-16 DIAGNOSIS — I481 Persistent atrial fibrillation: Secondary | ICD-10-CM | POA: Diagnosis not present

## 2015-11-16 DIAGNOSIS — I4891 Unspecified atrial fibrillation: Secondary | ICD-10-CM

## 2015-11-16 DIAGNOSIS — Z5181 Encounter for therapeutic drug level monitoring: Secondary | ICD-10-CM

## 2015-11-16 DIAGNOSIS — Z7901 Long term (current) use of anticoagulants: Secondary | ICD-10-CM

## 2015-11-16 DIAGNOSIS — I4819 Other persistent atrial fibrillation: Secondary | ICD-10-CM

## 2015-11-16 LAB — POCT INR: INR: 2.2

## 2015-11-18 ENCOUNTER — Other Ambulatory Visit: Payer: Self-pay | Admitting: Internal Medicine

## 2015-11-21 ENCOUNTER — Other Ambulatory Visit: Payer: Self-pay | Admitting: Pulmonary Disease

## 2015-11-30 ENCOUNTER — Other Ambulatory Visit: Payer: Self-pay | Admitting: Pulmonary Disease

## 2015-12-12 ENCOUNTER — Ambulatory Visit (INDEPENDENT_AMBULATORY_CARE_PROVIDER_SITE_OTHER): Payer: Medicare Other | Admitting: *Deleted

## 2015-12-12 DIAGNOSIS — I481 Persistent atrial fibrillation: Secondary | ICD-10-CM | POA: Diagnosis not present

## 2015-12-12 DIAGNOSIS — I4891 Unspecified atrial fibrillation: Secondary | ICD-10-CM | POA: Diagnosis not present

## 2015-12-12 DIAGNOSIS — Z7901 Long term (current) use of anticoagulants: Secondary | ICD-10-CM | POA: Diagnosis not present

## 2015-12-12 DIAGNOSIS — Z5181 Encounter for therapeutic drug level monitoring: Secondary | ICD-10-CM | POA: Diagnosis not present

## 2015-12-12 DIAGNOSIS — I4819 Other persistent atrial fibrillation: Secondary | ICD-10-CM

## 2015-12-12 LAB — POCT INR: INR: 2.5

## 2015-12-16 ENCOUNTER — Other Ambulatory Visit: Payer: Self-pay | Admitting: Pulmonary Disease

## 2015-12-16 MED ORDER — POTASSIUM CHLORIDE CRYS ER 20 MEQ PO TBCR: 20.0000 meq | EXTENDED_RELEASE_TABLET | Freq: Two times a day (BID) | ORAL | Status: DC

## 2015-12-16 MED ORDER — FUROSEMIDE 40 MG PO TABS: ORAL_TABLET | ORAL | Status: DC

## 2015-12-16 NOTE — Telephone Encounter (Signed)
Received faxes from [facility] for:  Furosemide 40mg , 2 every morning #60 with 5 refills [place] Con 35meq, 1po BID #180 with 2 refills  Last ov 5.31.17 Refills sent Will sign off

## 2016-01-10 LAB — HM DIABETES EYE EXAM

## 2016-01-23 ENCOUNTER — Encounter: Payer: Self-pay | Admitting: Pulmonary Disease

## 2016-01-24 ENCOUNTER — Ambulatory Visit (INDEPENDENT_AMBULATORY_CARE_PROVIDER_SITE_OTHER): Payer: Medicare Other | Admitting: Pharmacist

## 2016-01-24 ENCOUNTER — Other Ambulatory Visit: Payer: Self-pay | Admitting: Pulmonary Disease

## 2016-01-24 DIAGNOSIS — I4891 Unspecified atrial fibrillation: Secondary | ICD-10-CM | POA: Diagnosis not present

## 2016-01-24 DIAGNOSIS — Z5181 Encounter for therapeutic drug level monitoring: Secondary | ICD-10-CM | POA: Diagnosis not present

## 2016-01-24 DIAGNOSIS — Z7901 Long term (current) use of anticoagulants: Secondary | ICD-10-CM | POA: Diagnosis not present

## 2016-01-24 LAB — POCT INR: INR: 2

## 2016-02-19 ENCOUNTER — Other Ambulatory Visit: Payer: Self-pay | Admitting: Pulmonary Disease

## 2016-03-06 ENCOUNTER — Ambulatory Visit (INDEPENDENT_AMBULATORY_CARE_PROVIDER_SITE_OTHER): Payer: Medicare Other | Admitting: *Deleted

## 2016-03-06 DIAGNOSIS — Z5181 Encounter for therapeutic drug level monitoring: Secondary | ICD-10-CM

## 2016-03-06 DIAGNOSIS — Z7901 Long term (current) use of anticoagulants: Secondary | ICD-10-CM | POA: Diagnosis not present

## 2016-03-06 DIAGNOSIS — I4891 Unspecified atrial fibrillation: Secondary | ICD-10-CM

## 2016-03-06 LAB — POCT INR: INR: 2.1

## 2016-03-12 ENCOUNTER — Ambulatory Visit (INDEPENDENT_AMBULATORY_CARE_PROVIDER_SITE_OTHER): Payer: Medicare Other | Admitting: Pulmonary Disease

## 2016-03-12 VITALS — BP 124/78 | HR 87 | Temp 98.7°F | Ht 63.0 in | Wt 140.0 lb

## 2016-03-12 DIAGNOSIS — Z23 Encounter for immunization: Secondary | ICD-10-CM | POA: Diagnosis not present

## 2016-03-12 DIAGNOSIS — I872 Venous insufficiency (chronic) (peripheral): Secondary | ICD-10-CM | POA: Diagnosis not present

## 2016-03-12 DIAGNOSIS — I481 Persistent atrial fibrillation: Secondary | ICD-10-CM | POA: Diagnosis not present

## 2016-03-12 DIAGNOSIS — R609 Edema, unspecified: Secondary | ICD-10-CM

## 2016-03-12 DIAGNOSIS — I272 Pulmonary hypertension, unspecified: Secondary | ICD-10-CM

## 2016-03-12 DIAGNOSIS — I10 Essential (primary) hypertension: Secondary | ICD-10-CM

## 2016-03-12 DIAGNOSIS — I4819 Other persistent atrial fibrillation: Secondary | ICD-10-CM

## 2016-03-12 NOTE — Patient Instructions (Signed)
Today we updated your med list in our EPIC system...    Continue your current medications the same...  Today we gave you the 2017 FLU vaccine...  Call for any questions...  Let's plan a follow up visit in 4-79mo, sooner if needed for problems.Marland KitchenMarland Kitchen

## 2016-03-12 NOTE — Progress Notes (Signed)
Subjective:    Patient ID: Cheyenne [name], female    DOB: 1926/02/09, 82 y.o.   MRN: [id number]  HPI 82 y/o BF here for a follow up visit... she has multiple medical problems as noted below>> ~  SEE PREV EPIC NOTES FOR OLDER DATA >>    LABS 11/13:  Chems- ok w/ K=3.7 CO2=34 BUN=23 Creat=0.9;  BNP=470...   CXR 5/14 showed cardiomeg, clear lungs, DJD spine, NAD...  LABS 5/14:  Chems- ok x K=3.4;  CBC- ok w/ Hg=12.3;  TSH=1.38;  VitD=31;  BNP=463...  CXR 5/15 showed mild cardiomeg, clear lungs, DJD spine, surg clips right axilla, NAD...  LABS 5/15:  Chems- ok x K=3.4 on K20/d & asked to incr back to Bid;    Ambulate on RA:  96% O2sat on RA at rest w/ HR=52;  87% on RA after 1lap w/ HR=109 => continue same O2 therapy...   LABS 11/15:  BMet- ok w/ K=4.1, HCO3=33, BUN=16, Cr=0.9.Marland Kitchen.  ~  Nov 03, 2014:  96mo ROV & Hend has had a stable interval- CC is itching on her back, no rash, no new meds etc; states her breathing is ok, DOE w/o change, no CP, min cough & Hycodan prn helps; weight is down 5# to 151# & less edema, reminded to elim all salt from her diet... We reviewed the following medical problems during today's office visit >>     Hypoxemia, PulmHTN> she has HomeO2 (not using it all the time), ProairHFA prn, & Hycodan cough syrup; she feels her breathing is at baseline; last 2DEcho 5/13 w/ PAsys~50 & stable...    HBP> on ASA81, Propran80-1/2Bid, Quinapril40, Lasix40-2Qam, Diamox250-2Qpm, & K20Bid; BP= 118/70 & she denies CP, palpit, ch in SOB, & decr edema w/ 5# wt loss.    AFib, SSS, Tachy-brady> on Coumadin (via CC); seen by DrKlein for Cards/EP in the past...    Venous Insuffic & edema> she had incr ankle edema & improved w/ elim sodium, elev legs, support hose & continue same diuretics...    DM> on diet alone; wt is down to 151# today; BS has been normal ~100...    GI- Divertics, IBS, Polyps> on Immodium prn; she saw DrStark 12/13> IBS, hx divertics & colon polyps, he did not rec  surveillance colonoscopy for her...    GU- bladder symptoms> she has freq & urge/stress incont- trial Oxybutynin10mg  & eval by Urology DrManny> rec changing diuretics if poss but it is not poss...    Hx bilat breast cancers> s/p mastectomies in 1996 & 2002, followed by [facility] & doing well w/o known recurrence...    DJD> on Allopurinol 300, Tramadol50; stable w/ mod DJD & limited mobility (knees, hips)...    Early dementia, anxiety> Aware- family helps w/ her meds & helps at home... We reviewed prob list, meds, xrays and labs> see below for updates >> she is due for PREVNAR-13 shot today...  CXR 5/16 showed mild cardiomeg, clear lungs, no effusion, NAD...   LABS 5/16>  Chems- wnl w/ HCO3=34, Cr=0.90;  CBC- wnl w/ Hg=12.1, Fe=65 (22%sat), Ferritin=113;  TSH=2.65...  IMP/PLAN >> Cheyenne [name] is reasonably stable at age 48- given Prevnar-34 today, meds refilled by request, same meds...   ~  May 10, 2015:  79mo ROV & Cheyenne [name] reports doing well- no new complaints or concerns; Epic review shows several calls for refill Hycodan cough syrup (says she uses it for sleep!=> rec to try TylenolPM & Delsym, not the Hydrocodon);  She says her breathing is improved, denies  SOB/ cough/ sput, CP, palpit, etc;  She uses her O2 w/ activity & she exercises w/ Silver Sneakers once per wk & exercise bike at home...    Hypoxemia, PulmHTN> she has HomeO2 (not using it now), ProairHFA prn, & prev Hycodan cough syrup; she feels her breathing is at baseline; last 2DEcho 5/13 w/ PAsys~50 & stable...    HBP> on ASA81, Propran80-1/2Bid, Accupril40, Lasix40-2Qam, Diamox250-2Qpm, & K20Bid; BP= 134/76 & she denies CP, palpit, ch in SOB, & decr edema w/ 5# wt loss.    AFib, SSS, Tachy-brady> on Coumadin (via CC); seen by DrKlein for Cards/EP in the past; denies CP, palpit, change in SOB/edema...    Venous Insuffic & edema> she had incr ankle edema & improved w/ elim sodium, elev legs, support hose & continue same diuretics...    DM>  on diet alone; wt is down to 144# today; BS has been normal ~100...    GI- Divertics, IBS, Polyps> on Immodium prn; she saw DrStark 12/13> IBS, hx divertics & colon polyps, he did not rec surveillance colonoscopy for her...    GU- bladder symptoms> she has freq & urge/stress incont- trial Oxybutynin10mg  & eval by Urology DrManny> rec changing diuretics if poss but it is not poss...    Hx bilat breast cancers> s/p mastectomies in 1996 & 2002, followed by [facility] & doing well w/o known recurrence...    DJD> on Allopurinol 300, Tramadol50, VitD2000; stable w/ mod DJD & limited mobility (knees, hips); she saw DrZSmith for neck pain, DDD, musc spasm, decr ROM- given exercises, ice/heat, Pennsaid, Tylenol, VitD rx.    Early dementia, anxiety> Aware- family helps w/ her meds & helps at home... EXAM shows Afeb, VSS, O2sat=96% on RA at rest;  Heent- neg, mallampati2;  Chest- clear w/o w/r/r; Heart- RR gr1/6 w/o r/g; Abd- soft, nontender, neg; Ext- VI, trace edema; Neuro- memory prob, no focal abn... IMP/PLAN>>  Cheyenne [name] is stable, same meds, OK 2016 Flu vaccine today, we plan ROV 68mo...   ~  September 02, 2015:  41mo ROV & add-on appt requested post ER visit> Cheyenne [name] went to the ER 08/29/15 w/ a "bump" on her vagina- it had been many yrs since she saw a Gyn, note made of her AFib hx and coumadin clinic rx; they noted bruising in her lower abd wall towards the left side & in her vaginal area- initially w/ some mild discomfort but no severe pain or bleeding; she denied any injury but later mentioned her exercise bike w/ uncomfortable seat that comes to a point near her pubis (she rides for 1H nightly while watching Cheyenne [name]!);  She was evaluated by DrZammit including EXAM that showed neg abd exam x ecchymosis over lower left abd wall and mons pubis, +hematoma described in left labial fold, remainder of exam neg;  LABS showed Hg=11.5, Protime=23.9/ INR=2.15, CT Abd&Pelvis showed 5.2 x 2.7 cm intermediate soft tissue  density in the left perineum deep to the labia suggesting a hematoma;  Coumadin was held & she was asked to follow up w/ Gyn regarding the hematoma & here regarding the coumadin rx...    She has held the Coumadin since 3/20, not riding the bike since then either, and she notes that the bruising is slowly diminishing, the "knot" seems to be getting smaller, bruising about the same- she denies any pain at present;  Plan is to continue off Coumadin until her Gyn check on 09/05/15, and as long as the deep hematoma appears to be resolving then  we will resume her anticoag on a sl lower dose to start... EXAM shows Afeb, VSS, O2sat=97% on 2L/min pulse dose;  HEENT- neg, mallampati2;  Chest- clear w/o w/r/r;  Heart- irreg Afib, rate60, gr1/6 SEM w/o r/g;  Abd- soft nontender, bruising in LLQ;  Ext= VI, trace edema;  Neuro- nonfocal... IMP/PLAN>>  Perineum hematoma likely from exercise bike seat trauma + her Coumadin therapy for AFib;  She appears stable off the Coumadin for the past 4d & notes that there is no pain & the "knot" appears to be getting smaller;  Plan is to remain off Coumadin until her Gyn check on 3/27 & if appears satis then we will rec resuming her anticoag rx at that time...   ~  Nov 09, 2015:  72mo ROV & pulmonary/medical recheck>  Her pelvic hematoma has resolved and she is back on Coumadin- followed closely in the CC, labs reviewed;  She has noted some swelling in feet & ankles, incr DOE, some orthopnea;  She is on Lasix40-2Qam + KCl Bid but she is NOT restricting dietary sodium...     EXAM shows Afeb, VSS, O2sat=92% on 2L/min pulse dose;  HEENT- neg, mallampati2;  Chest- clear w/o w/r/r;  Heart- irreg Afib, rate60, gr1/6 SEM w/o r/g;  Abd- soft nontender, bruising in LLQ;  Ext= VI, 2+ edema... IMP/PLAN>>  Cheyenne [name] is generally stable but has incr edema & needs NO SALT diet, continue Lasix80Qam;  She wants to restart exercise bike & has new seat- so- OK... we plan ROV in 19mo w/ labs etc...  ~   March 12, 2016:  44mo ROV & Cheyenne [name] reports doing well- no new complaints or concerns;  Her breathing is good, uses her port O2 just as needed, notes swelling is diminished  & she's using the support hose    Hypoxemia, PulmHTN> she has HomeO2 (using it prn), ProairHFA prn, & prev Hycodan cough syrup; she feels her breathing is at baseline; last 2DEcho 5/13 w/ PAsys~50 & stable...    HBP> on ASA81, Propran80-1/2Bid, Accupril40, Lasix40-2Qam, Diamox250-2Qpm, & K20Bid; BP= 124/78 & she denies CP, palpit, ch in SOB, & decr edema w/ 5# wt loss; K~3.5-4.1, PM:[id number]    AFib, SSS, Tachy-brady> on Coumadin (via CC); seen by DrKlein for Cards/EP in the past; denies CP, palpit, change in SOB/edema...    Venous Insuffic & edema> she had incr ankle edema & improved w/ elim sodium, elev legs, support hose & continue same diuretics...    DM> on diet alone; wt is down to 144# today; BS has been normal ~100...    GI- Divertics, IBS, Polyps> on Immodium prn; she saw DrStark 12/13> IBS, hx divertics & colon polyps, he did not rec surveillance colonoscopy for her...    GU- bladder symptoms> she has freq & urge/stress incont- trial Oxybutynin10mg  & eval by Urology DrManny> rec changing diuretics if poss but it is not poss...    Hx bilat breast cancers> s/p mastectomies in 1996 & 2002, followed by [facility] & doing well w/o known recurrence...    DJD> on Allopurinol 300, Tramadol50, VitD2000; stable w/ mod DJD & limited mobility (knees, hips); she saw DrZSmith for neck pain, DDD, musc spasm, decr ROM- given exercises, ice/heat, Pennsaid, Tylenol, VitD rx.    Early dementia, anxiety> Aware- family helps w/ her meds & helps at home... EXAM shows Afeb, VSS, O2sat=96% on RA at rest;  Heent- neg, mallampati2;  Chest- clear w/o w/r/r; Heart- RR gr1/6 w/o r/g; Abd- soft, nontender, neg; Ext- VI, 1+edema;  Neuro- memory prob, no focal abn... IMP/PLAN>>  Cheyenne [name] is stable, continue same meds, OK Flu shot today...            Problem List:  ALLERGIC RHINITIS (ICD-477.9) - on FLONASE Qhs & ZYRTEK QAM Prn.  Hx of HYPOXEMIA (ICD-799.02) - full eval for hypoxemia in [place] by 2DEcho ~  7/08:  CXR= cardiomegaly, ectatic Ao, pulm ven HTN... CTAngio= neg, without PE, min scarring in lingula... 2DEcho= norm LVF w/ EF=55%, LVwall thickness at upper limits, mild ca++ AoV w/ mild AI,  PA sys pressure = 60mmHg... O2 sats 90-94 at rest on RA... w/ ambulation in the office sats drop to 85%... she didn't yet want to go on oxygen at home... just occas SOB w/ activity- she is too sedentary and needs to incr her exercise program. ~  CTChest 6/09 showed bilat mastectomies, no adenopathy, no fluid, 2 sm nodules 4-33mm size RML/LLL, NAD... ~  6/10: c/o increased DOE & no energy- O2 sat= 91% rest & 86% w/activ... rec> home O2 1L/min rest, 2L/min exercise. ~  4/11 hosp> V/Q scan w/ normal vent & norm perfusion... CXR 4/11 showed cardiomeg, clear lungs, no CHF, surg clips in right axilla... ~  8/11:  pt requesting change to LiqO2 to incr mobility. ~  11/12:  O2 sat= 94% at rest on 2L/min oxygen via [place]... remains stable. ~  11/13:  O2 sat= 100% on 2L/min at rest... ~  CXR 5/14 showed cardiomeg, clear lungs, DJD spine, NAD ~  99991111:  O2 recert today> O2 sat on RA at rest=96% w/ HR=65/min;  Ambulated 3 laps on RA w/ nadir O2 sat=88% w/ HR=116; Rec to continue O2 w/ exercise & Qhs... ~  CXR 5/15 showed mild cardiomeg, clear lungs, DJD spine, surg clips right axilla, NAD.Marland Kitchen. ~  5/15: Ambulate on RA:  96% O2sat on RA at rest w/ HR=52;  87% ob RA after 1lap w/ HR=109 => continue same O2 therapy ~  11/15: she remains on O2 at 2-3L/min flow; O2sats on 3L/min today = 98%... ~  5/16: she has port O2 at home but states not using it now; O2sat=94% on RA at rest today...  ~  11/16: she remains on O2 at 2L/min pulse dose w/ exercise...  HYPERTENSION (ICD-401.9) - controlled on PROPRANOLOL 80mg Bid, QUINAPRIL 40mg /d; and back  on LASIX 40mg AM, KCL 67mEqAM, & ACETAZOLAMIDE 250mg - 2tabsPM... she knows to avoid sodium etc... ~  labs 2/09 showed Na143, K3.6, CL101, CO2 36, BUN 15, Cr0.9.Marland KitchenMarland Kitchen BNP was 194... ~  labs 6/09 showed lytes normal x TCO2= 37, & BNP= 512... ~  labs 12/09 showed HCO3- 36,  BNP= 586... rec- same meds, no salt! ~  labs 6/10 showed Na= 146, K= 3.6, CO2= 36, BUN= 14, Creat= 0.9, BNP= 336. ~  labs 9/10 showed Na-147, K=3.7, HCO3=36, BUN=18, Creat=0.9, BNP=484 ~  labs 1/11 showed Na=144, K=3.9, HCO3=35, BUN=17, Creat=0.9, BNP=488 ~  [facility] 4/11 & meds changed by TH> off diuretics & KCl... CXR showed cardiomeg, clear lungs, clips right axilla. ~  labs 10/15/09 showed Na=143, K=4.0, HCO3=37, BUN=12, Creat=0.8, BNP=761... restart Lasix, Diamox, KCl (one each). ~  labs 10/25/09 showed HCO3=44, BNP=588... rec> incr Diamox to 2Qpm... ~  labs 8/11 showed HCO3=39, BNP=481... continue same. ~  Labs 5/12 showed HCO3=35, BNP=373... Improved, continue same Rx. ~  Labs 11/12 showed HCO3=37, BNP=351... Stable continue same meds. ~  5/13: BP= 138/88 & HCO3=39, BUN=17, Creat=0.9, BNP=534... Reminded to take meds daily. ~  11/3: on  ASA81, Propran80Bid, Quinapril40, Lasix40, Diamox250-2/d, & K20; BP= 128/82 & she denies CP, palpit, ch in SOB or edema...  ~  5/14: on ASA81, Propran80Bid, Quinapril40, Lasix40, Diamox250-2/d, & K20Bid; BP= 136/80 & she denies CP, palpit, ch in SOB or edema ~  11/14: on ASA81, Propran80-1/2Bid, Quinapril40, Lasix40, Diamox250-2/d, & K20Bid; BP= 142/80 & she remains essentially asymptomatic... ~  5/15: on ASA81, Propran80-1/2Bid, Quinapril40, Lasix40, Diamox250-2/d, & K20Bid; BP= 144/80 & she denies CP, palpit, ch in SOB, but has developed ankle edema & 5# wt gain ~  11/15: on ASA81, Propran80-1/2Bid, Quinapril40, Lasix40, Diamox250-2/d, & K20Bid; BP= 120/80 & she remains minimally symptomatic but minimally active as well... ~  5/16: on ASA81, Propran80-1/2Bid, Quinapril40, Lasix40-2Qam, Diamox250-2Qpm,  & K20Bid; BP= 118/70 & she denies CP, palpit, ch in SOB, & decr edema w/ 5# wt loss.  ATRIAL FIBRILLATION (ICD-427.31) & SICK SINUS/ TACHY-BRADY SYNDROME (ICD-427.81) - new prob 1/10 w/ eval by DrKlein- rate control strategy on Propranolol w/ [facility]. ~  7/08:  2DEcho= norm LVF w/ EF=55%, LVwall thickness at upper limits, mild ca++ AoV w/ mild AI,  PA sys pressure = 26mmHg... ~  EKG 1/10 showed AFib, rate~ 60, NSSTTWA... ~  2DEcho 3/10 showed norm LVF w/ EF= 60% & no regional wall motion abn, mild calcif AoV w/ mild AI, mild LA & RA dil w/ incr thickness of interatrial septum c/w lipomatous hypertrophy, PA sys est at 50... ~  Holter monitor 3/10 = AFib w/ rate 45-97... ~  2DEcho 4/11 in hosp showed norm LVwall thickness & EF=55%, mild RVdil w/ mod decr RV function, mod TR & PAsys~69... ~  2DEcho 5/13 showed normal LVF w/ EF=55%, mild AI & MR, mild LA & RA dilatation, PAsys=49... Stable. ~  Stable on Coumadin & followed by DrKlein w/ rate control strategy...  PULMONARY HYPERTENSION >> Serial 2DEchos showed +- stable PA sys estimates (see above). ~  V/Q lung scan 4/11 was neg for PE... ~  2DEcho 5/13 showed normal LVF w/ EF=55%, mild AI & MR, mild LA & RA dilatation, PAsys=49... Stable. ~  She is way too sedentary but not motivated to exercise etc... ~  Subsequently started exercising on a stationary bike at home...  VENOUS INSUFFICIENCY (ICD-459.81) - mild persist edema despite the sodium restriction, Lasix and Diamox... reviewed recommendation for No Salt, Elevation, TED's, etc... ~  11/15: she presented w/ 10# further wt gain and 4+edema in feet; BMet was OK & we decided to incr Lasix40 to 2tabsQam, continue other meds 7 recheck labs in 6wks... ~  6/15: she had incr ankle edema & improved w/ elim sodium, elev legs, support hose & continue same diuretics  DIABETES MELLITUS, BORDERLINE (ICD-790.29) - prev on Metformin 500mg /d (held after 4/11 hosp)... ~  last BS=181,  HgA1c=6.4 in 11/08... ~  labs 6/09 showed BS= 101, HgA1c= 6.4 ~  labs 12/09 showed BS= 139,  HgA1c= 6.1 ~  labs 6/10 showed BS= 123, A1c= 6.1 ~  7/10: neg ophthal f/u DrHecker- no retinopathy, no macular edema. ~  labs 9/10 showed BS= 93 ~  labs 1/11 showed BS=146, A1c=5.7 ~  labs 5/11 showed BS= 100 on diet alone... ~  labs 8/11 showed BS= 96 ~  Labs 5/12 showed BS= 113, A1c= 5.8 ~  9/12:  Ophthalmology check by DrHecker- no DM retinopathy... ~  Labs 11/12 showed BS= 104 ~  Labs 5/13 showed BS= 118 ~  Labs 11/13 showed BS= 97 ~  2/14: she had a neg Ophthalmology  eval by DrHecker... ~  Labs 5/14 showed BS= 102 on diet alone... ~  On diet alone, BS ~100 and stable...  DIVERTICULOSIS OF COLON, IRRITABLE BOWEL SYNDROME & COLONIC POLYPS (ICD-211.3) ~  colonoscopy was 11/06 by DrStark showing divertics and several polyps (one 61mm adenoma)... f/u 17yrs. ~  colonoscopy 1/10 by DrStark showed divertics (severe in sigmoid), 79mm polyp= tub adenoma... f/u 3 yrs. ~  12/13:  She had GI f/u DrStark> IBS w/ freq loose stools, occas BRB, hx extensive divertics & adenomatous polyps, on coumadin for AFib;   URINARY FREQ&URGENCY >>  ~  11/13: she had a GU eval by DrManny> c/o freq & urgency assoc w/ Lasix, some leakage & managing w/ one pad/d, some better w/ Oxybutynin... ~  5/14: she had f/u DrManny, Urology> urinary freq & urgency; worse w/ Lasix; mild leakage & uses 1 pad/d; Oxybutynin caused dry mouth; situation is acceptable & no further meds rec...  BILAT BREAST CANCERS - resected via mastectomies in 1996 and 2002... followed by [facility] annually & seen 8/12> note reviewed. ~  1/15: she had her yearly f/u by [facility]- his note is reviewed, stable, wound over sternum is resolved...   DEGENERATIVE JOINT DISEASE (ICD-715.90) - w/ hx hyperuricemia on ALLOPURINOL 300mg /d & has TRAMADOL 50mg  prn pain...  Hx of HEADACHE (ICD-784.0) ~  MRI Brain 4/11 showed atrophy & sm vessel dis, degen changes at C1-2  w/ some sp stenosis caused part by ant slip of the C1 ring, incidental part empty sella...  ANXIETY (ICD-300.00) - prev rec to take Alpraz 0,5mg  Prn but she never filled the Rx. ~  Remeron 7.5mg /d started 4/11 hosp & stopped 5/11 due to lethargy, sleepiness...   Past Surgical History:  Procedure Laterality Date  . BREAST SURGERY    . CATARACT EXTRACTION    . CHOLECYSTECTOMY    . DOUBLE MASTECTOMY    . KNEE SURGERY  2004   RIGHT  . MASTECTOMY  1996   right breast for breast cancer  . MASTECTOMY  1/02   left - Dr Lucia Gaskins  . TOTAL KNEE ARTHROPLASTY  6/04   right - Dr Telford Nab    Outpatient Encounter Prescriptions as of 03/12/2016  Medication Sig Dispense Refill  . acetaZOLAMIDE (DIAMOX) 250 MG tablet TAKE 2 TABLETS BY MOUTH DAILY AT 4PM 60 tablet 6  . albuterol (PROVENTIL HFA;VENTOLIN HFA) 108 (90 BASE) MCG/ACT inhaler Inhale 1-2 puffs into the lungs every 6 (six) hours as needed for wheezing or shortness of breath.    . allopurinol (ZYLOPRIM) 300 MG tablet TAKE 1 TABLET (300 MG TOTAL) BY MOUTH DAILY. 30 tablet 5  . aspirin 81 MG tablet Take 81 mg by mouth daily.      . Cholecalciferol (VITAMIN D3) 5000 units CAPS Take 1 capsule by mouth daily.    . furosemide (LASIX) 40 MG tablet TAKE 2 TABLET EVERY MORNING 60 tablet 5  . loperamide (IMODIUM) 2 MG capsule Take 2 mg by mouth as needed for diarrhea or loose stools.     . Multiple Vitamin (MULTIVITAMIN) capsule Take 1 capsule by mouth daily.      . Multiple Vitamins-Minerals (PRESERVISION AREDS PO) Take 1 tablet by mouth daily.    Marland Kitchen oxybutynin (DITROPAN-XL) 10 MG 24 hr tablet TAKE 1 TABLET EVERY DAY FOR BLADDER SPASMS 30 tablet 5  . potassium chloride SA (KLOR-CON M20) 20 MEQ tablet Take 1 tablet (20 mEq total) by mouth 2 (two) times daily. 180 tablet 2  . propranolol (INDERAL) 80 MG  tablet TAKE 1/2 TABLET BY MOUTH TWO TIMES DAILY 90 tablet 2  . quinapril (ACCUPRIL) 40 MG tablet TAKE 1 TABLET (40 MG TOTAL) BY MOUTH DAILY. 90 tablet 0  .  traMADol (ULTRAM) 50 MG tablet TAKE 1 TABLET THREE TIMES A DAY AS NEEDED FOR PAIN 50 tablet 0  . warfarin (COUMADIN) 5 MG tablet TAKE AS DIRECTED BY COUMADIN CLINIC 50 tablet 3   No facility-administered encounter medications on file as of 03/12/2016.     Allergies  Allergen Reactions  . Iohexol      Desc: IV History sheet from prior CT states IV Contrast allergy- 13 hour prep given.     Immunization History  Administered Date(s) Administered  . H1N1 06/07/2008  . Influenza Split 04/24/2011, 04/22/2012  . Influenza Whole 03/08/2009, 06/26/2010  . Influenza,inj,Quad PF,36+ Mos 04/28/2013, 04/30/2014, 05/10/2015  . Pneumococcal Conjugate-13 11/09/2014  . Pneumococcal Polysaccharide-23 10/10/2009    Current Medications, Allergies, Past Medical History, Past Surgical History, Family History, and Social History were reviewed in [employer] record.    Review of Systems         See HPI - all other systems neg except as noted...  The patient complains of dyspnea on exertion and peripheral edema.  The patient denies anorexia, fever, weight loss, weight gain, vision loss, decreased hearing, hoarseness, chest pain, syncope, prolonged cough, headaches, hemoptysis, abdominal pain, melena, hematochezia, severe indigestion/heartburn, hematuria, incontinence, muscle weakness, suspicious skin lesions, transient blindness, difficulty walking, depression, unusual weight change, abnormal bleeding, enlarged lymph nodes, and angioedema.     Objective:   Physical Exam      WD, WN, 82 y/o BF in NAD... GENERAL:  Alert & oriented; pleasant & cooperative... HEENT:  /AT, EOM-wnl, PERRLA, EACs-clear, TMs-wnl, NOSE-clear, THROAT-clear & wnl. NECK:  Supple w/ fairROM; no JVD; normal carotid impulses w/o bruits; no thyromegaly or nodules palpated; no lymphadenopathy. CHEST:  Clear to P & A; without wheezes/ rales/ or rhonchi heard... HEART:  irregular rhythm; gr 1/6 SEM without rubs  or gallops appreciated... ABDOMEN:  Soft & nontender; normal bowel sounds; no organomegaly or masses detected.    Bruising noted onver LLQ down towards the mons pubis EXT: without deformities, mild arthritic changes; no varicose veins/ +venous insuffic/ tr+edema today... NEURO:  CN's intact; no focal neuro deficits... DERM:  There is a rounded lesion left calf c/w ringworm...  RADIOLOGY DATA:  Reviewed in the EPIC EMR & discussed w/ the patient...  LABORATORY DATA:  Reviewed in the EPIC EMR & discussed w/ the patient...   Assessment & Plan:    HYPOXEMIA & CO2 retention>  Stable on her O2 (but only using it prn) & w/ diuretic regimen including Acetazolamide, continue same... PULM HYPERTENSION ?Etiology>  We rechecked 2DEcho 5/13 & everything looks stable at age 80 compared to 89yrs prev, continue same meds for now. ~  Cheyenne [name] continues to remain stable on curren meds; she doesn't want repeat 2DEcho etc since she is doing so well & stable overall...  HBP>  Controlled on Propranolol, Quinapril, Lasix, & Diamox; continue same...  AFib>  Stable on rate control strategy, no apparent prob w/ tachy or brady; continue same meds...  Ven Insuffic & Edema>  She is on Lasix40-2Qam, Diamox250-2Qpm, K20Bid; Chems are ok & reminded NO SALT, elevate, TED hose etc...  DM>  Well maintained on diet Rx...  GI>  Divertics, IBS, Polyps>  Stable, we discussed Simethacone Rx for gas...  GU> trial Oxybutynin10 for bladder symptoms and refer to Urology  per daugh request...  Hx bilat breast cancers>  Stable, followed by [facility], no known recurrence... Skin lesion on chest wall resolved w/ Rx from [facility]...  Hx PERINEUM HEMATOMA, likely due to exercise bike seat trauma + her coumadin therapy> Coumadin on hold til she can be checked by Gyn; needs a better more comfortable bike seat!  DJD>  Hx hyperuricemia on Allopurinol Rx & no clinical attacks...  Anxiety>  Stable on Prn Alprazolam Rx...   Patient's  Medications  New Prescriptions   No medications on file  Previous Medications   ACETAZOLAMIDE (DIAMOX) 250 MG TABLET    TAKE 2 TABLETS BY MOUTH DAILY AT 4PM   ALBUTEROL (PROVENTIL HFA;VENTOLIN HFA) 108 (90 BASE) MCG/ACT INHALER    Inhale 1-2 puffs into the lungs every 6 (six) hours as needed for wheezing or shortness of breath.   ALLOPURINOL (ZYLOPRIM) 300 MG TABLET    TAKE 1 TABLET (300 MG TOTAL) BY MOUTH DAILY.   ASPIRIN 81 MG TABLET    Take 81 mg by mouth daily.     CHOLECALCIFEROL (VITAMIN D3) 5000 UNITS CAPS    Take 1 capsule by mouth daily.   FUROSEMIDE (LASIX) 40 MG TABLET    TAKE 2 TABLET EVERY MORNING   LOPERAMIDE (IMODIUM) 2 MG CAPSULE    Take 2 mg by mouth as needed for diarrhea or loose stools.    MULTIPLE VITAMIN (MULTIVITAMIN) CAPSULE    Take 1 capsule by mouth daily.     MULTIPLE VITAMINS-MINERALS (PRESERVISION AREDS PO)    Take 1 tablet by mouth daily.   OXYBUTYNIN (DITROPAN-XL) 10 MG 24 HR TABLET    TAKE 1 TABLET EVERY DAY FOR BLADDER SPASMS   POTASSIUM CHLORIDE SA (KLOR-CON M20) 20 MEQ TABLET    Take 1 tablet (20 mEq total) by mouth 2 (two) times daily.   PROPRANOLOL (INDERAL) 80 MG TABLET    TAKE 1/2 TABLET BY MOUTH TWO TIMES DAILY   QUINAPRIL (ACCUPRIL) 40 MG TABLET    TAKE 1 TABLET (40 MG TOTAL) BY MOUTH DAILY.   TRAMADOL (ULTRAM) 50 MG TABLET    TAKE 1 TABLET THREE TIMES A DAY AS NEEDED FOR PAIN   WARFARIN (COUMADIN) 5 MG TABLET    TAKE AS DIRECTED BY COUMADIN CLINIC  Modified Medications   No medications on file  Discontinued Medications   No medications on file

## 2016-03-16 ENCOUNTER — Other Ambulatory Visit: Payer: Self-pay | Admitting: *Deleted

## 2016-03-16 MED ORDER — WARFARIN SODIUM 5 MG PO TABS: ORAL_TABLET | ORAL | 3 refills | Status: DC

## 2016-04-02 ENCOUNTER — Other Ambulatory Visit: Payer: Self-pay

## 2016-04-02 MED ORDER — WARFARIN SODIUM 5 MG PO TABS: ORAL_TABLET | ORAL | 3 refills | Status: DC

## 2016-04-03 ENCOUNTER — Other Ambulatory Visit: Payer: Self-pay | Admitting: *Deleted

## 2016-04-03 MED ORDER — WARFARIN SODIUM 5 MG PO TABS: ORAL_TABLET | ORAL | 0 refills | Status: DC

## 2016-04-19 ENCOUNTER — Ambulatory Visit (INDEPENDENT_AMBULATORY_CARE_PROVIDER_SITE_OTHER): Payer: Medicare Other | Admitting: *Deleted

## 2016-04-19 DIAGNOSIS — Z7901 Long term (current) use of anticoagulants: Secondary | ICD-10-CM

## 2016-04-19 DIAGNOSIS — Z5181 Encounter for therapeutic drug level monitoring: Secondary | ICD-10-CM

## 2016-04-19 DIAGNOSIS — I4891 Unspecified atrial fibrillation: Secondary | ICD-10-CM

## 2016-04-19 LAB — POCT INR: INR: 2.3

## 2016-04-29 ENCOUNTER — Other Ambulatory Visit: Payer: Self-pay | Admitting: Pulmonary Disease

## 2016-05-02 ENCOUNTER — Other Ambulatory Visit: Payer: Self-pay

## 2016-05-02 MED ORDER — ALLOPURINOL 300 MG PO TABS: ORAL_TABLET | ORAL | 2 refills | Status: DC

## 2016-05-13 ENCOUNTER — Other Ambulatory Visit: Payer: Self-pay | Admitting: Pulmonary Disease

## 2016-05-29 ENCOUNTER — Other Ambulatory Visit: Payer: Self-pay | Admitting: Pulmonary Disease

## 2016-06-01 ENCOUNTER — Ambulatory Visit (INDEPENDENT_AMBULATORY_CARE_PROVIDER_SITE_OTHER): Payer: Medicare Other | Admitting: *Deleted

## 2016-06-01 DIAGNOSIS — Z7901 Long term (current) use of anticoagulants: Secondary | ICD-10-CM | POA: Diagnosis not present

## 2016-06-01 DIAGNOSIS — Z5181 Encounter for therapeutic drug level monitoring: Secondary | ICD-10-CM

## 2016-06-01 DIAGNOSIS — I4891 Unspecified atrial fibrillation: Secondary | ICD-10-CM | POA: Diagnosis not present

## 2016-06-01 LAB — POCT INR: INR: 2.9

## 2016-07-13 ENCOUNTER — Ambulatory Visit (INDEPENDENT_AMBULATORY_CARE_PROVIDER_SITE_OTHER): Payer: Medicare Other

## 2016-07-13 DIAGNOSIS — Z5181 Encounter for therapeutic drug level monitoring: Secondary | ICD-10-CM

## 2016-07-13 DIAGNOSIS — Z7901 Long term (current) use of anticoagulants: Secondary | ICD-10-CM | POA: Diagnosis not present

## 2016-07-13 DIAGNOSIS — I4891 Unspecified atrial fibrillation: Secondary | ICD-10-CM | POA: Diagnosis not present

## 2016-07-13 LAB — POCT INR: INR: 2.6

## 2016-07-19 ENCOUNTER — Other Ambulatory Visit: Payer: Self-pay | Admitting: Pulmonary Disease

## 2016-07-30 ENCOUNTER — Telehealth: Payer: Self-pay | Admitting: Pulmonary Disease

## 2016-07-30 MED ORDER — QUINAPRIL HCL 40 MG PO TABS: ORAL_TABLET | ORAL | 1 refills | Status: DC

## 2016-07-30 NOTE — Telephone Encounter (Signed)
Spoke with pt. She is needing a refill on Quinapril. Rx has been sent in. Nothing further was needed.

## 2016-08-20 ENCOUNTER — Other Ambulatory Visit: Payer: Self-pay | Admitting: Pulmonary Disease

## 2016-08-23 ENCOUNTER — Ambulatory Visit (INDEPENDENT_AMBULATORY_CARE_PROVIDER_SITE_OTHER): Payer: Medicare Other | Admitting: *Deleted

## 2016-08-23 DIAGNOSIS — I4891 Unspecified atrial fibrillation: Secondary | ICD-10-CM

## 2016-08-23 DIAGNOSIS — Z5181 Encounter for therapeutic drug level monitoring: Secondary | ICD-10-CM | POA: Diagnosis not present

## 2016-08-23 DIAGNOSIS — Z7901 Long term (current) use of anticoagulants: Secondary | ICD-10-CM

## 2016-08-23 LAB — POCT INR: INR: 3.2

## 2016-08-28 ENCOUNTER — Other Ambulatory Visit: Payer: Self-pay | Admitting: Pulmonary Disease

## 2016-09-01 ENCOUNTER — Other Ambulatory Visit: Payer: Self-pay | Admitting: Internal Medicine

## 2016-09-06 ENCOUNTER — Ambulatory Visit (INDEPENDENT_AMBULATORY_CARE_PROVIDER_SITE_OTHER): Payer: Medicare Other

## 2016-09-06 DIAGNOSIS — Z7901 Long term (current) use of anticoagulants: Secondary | ICD-10-CM | POA: Diagnosis not present

## 2016-09-06 DIAGNOSIS — Z5181 Encounter for therapeutic drug level monitoring: Secondary | ICD-10-CM

## 2016-09-06 DIAGNOSIS — I4891 Unspecified atrial fibrillation: Secondary | ICD-10-CM

## 2016-09-06 LAB — POCT INR: INR: 2.2

## 2016-09-10 ENCOUNTER — Ambulatory Visit (INDEPENDENT_AMBULATORY_CARE_PROVIDER_SITE_OTHER): Payer: Medicare Other | Admitting: Pulmonary Disease

## 2016-09-10 ENCOUNTER — Encounter: Payer: Self-pay | Admitting: Pulmonary Disease

## 2016-09-10 ENCOUNTER — Other Ambulatory Visit (INDEPENDENT_AMBULATORY_CARE_PROVIDER_SITE_OTHER): Payer: Medicare Other

## 2016-09-10 VITALS — BP 120/68 | HR 73 | Temp 98.0°F | Ht 63.0 in | Wt 140.0 lb

## 2016-09-10 DIAGNOSIS — I10 Essential (primary) hypertension: Secondary | ICD-10-CM

## 2016-09-10 DIAGNOSIS — I4819 Other persistent atrial fibrillation: Secondary | ICD-10-CM

## 2016-09-10 DIAGNOSIS — I272 Pulmonary hypertension, unspecified: Secondary | ICD-10-CM

## 2016-09-10 DIAGNOSIS — R609 Edema, unspecified: Secondary | ICD-10-CM

## 2016-09-10 DIAGNOSIS — I872 Venous insufficiency (chronic) (peripheral): Secondary | ICD-10-CM

## 2016-09-10 DIAGNOSIS — I481 Persistent atrial fibrillation: Secondary | ICD-10-CM | POA: Diagnosis not present

## 2016-09-10 LAB — CBC WITH DIFFERENTIAL/PLATELET
Basophils Absolute: 0 K/uL (ref 0.0–0.1)
Basophils Relative: 0.6 % (ref 0.0–3.0)
Eosinophils Absolute: 0.1 K/uL (ref 0.0–0.7)
Eosinophils Relative: 2.6 % (ref 0.0–5.0)
HCT: 38.5 % (ref 36.0–46.0)
Hemoglobin: 12.2 g/dL (ref 12.0–15.0)
Lymphocytes Relative: 18.1 % (ref 12.0–46.0)
Lymphs Abs: 1 K/uL (ref 0.7–4.0)
MCHC: 31.5 g/dL (ref 30.0–36.0)
MCV: 95.8 fl (ref 78.0–100.0)
Monocytes Absolute: 0.5 K/uL (ref 0.1–1.0)
Monocytes Relative: 9.4 % (ref 3.0–12.0)
Neutro Abs: 3.8 K/uL (ref 1.4–7.7)
Neutrophils Relative %: 69.3 % (ref 43.0–77.0)
Platelets: 153 K/uL (ref 150.0–400.0)
RBC: 4.02 Mil/uL (ref 3.87–5.11)
RDW: 15.3 % (ref 11.5–15.5)
WBC: 5.5 K/uL (ref 4.0–10.5)

## 2016-09-10 LAB — COMPREHENSIVE METABOLIC PANEL
ALT: 13 U/L (ref 0–35)
AST: 26 U/L (ref 0–37)
Albumin: 4 g/dL (ref 3.5–5.2)
Alkaline Phosphatase: 68 U/L (ref 39–117)
BUN: 23 mg/dL (ref 6–23)
CHLORIDE: 104 meq/L (ref 96–112)
CO2: 33 mEq/L — ABNORMAL HIGH (ref 19–32)
CREATININE: 0.98 mg/dL (ref 0.40–1.20)
Calcium: 9.7 mg/dL (ref 8.4–10.5)
GFR: 68.77 mL/min (ref >60.00)
Glucose, Bld: 95 mg/dL (ref 70–99)
Potassium: 3.9 mEq/L (ref 3.5–5.1)
Sodium: 142 mEq/L (ref 135–145)
Total Bilirubin: 0.8 mg/dL (ref 0.2–1.2)
Total Protein: 7.2 g/dL (ref 6.0–8.3)

## 2016-09-10 LAB — TSH: TSH: 2.79 u[IU]/mL (ref 0.35–4.50)

## 2016-09-10 LAB — BRAIN NATRIURETIC PEPTIDE: Pro B Natriuretic peptide (BNP): 393 pg/mL — ABNORMAL HIGH (ref 0.0–100.0)

## 2016-09-10 NOTE — Patient Instructions (Signed)
Today we updated your med list in our EPIC system...    Continue your current medications the same...  Today we did your follow up blood work...    We will contact you w/ the results when available...   Remember-- NO SALT/ sodium, and continue exercising...  Call for any questions...  Let's plan a follow up visit in 48mo, sooner if needed for problems.Marland KitchenMarland Kitchen

## 2016-09-10 NOTE — Progress Notes (Signed)
Subjective:    Patient ID: Cheyenne [name], female    DOB: 1926/02/09, 83 y.o.   MRN: [id number]  HPI 83 y/o BF here for a follow up visit... she has multiple medical problems as noted below>> ~  SEE PREV EPIC NOTES FOR OLDER DATA >>    LABS 11/13:  Chems- ok w/ K=3.7 CO2=34 BUN=23 Creat=0.9;  BNP=470...   CXR 5/14 showed cardiomeg, clear lungs, DJD spine, NAD...  LABS 5/14:  Chems- ok x K=3.4;  CBC- ok w/ Hg=12.3;  TSH=1.38;  VitD=31;  BNP=463...  CXR 5/15 showed mild cardiomeg, clear lungs, DJD spine, surg clips right axilla, NAD...  LABS 5/15:  Chems- ok x K=3.4 on K20/d & asked to incr back to Bid;    Ambulate on RA:  96% O2sat on RA at rest w/ HR=52;  87% on RA after 1lap w/ HR=109 => continue same O2 therapy...   LABS 11/15:  BMet- ok w/ K=4.1, HCO3=33, BUN=16, Cr=0.9.Marland Kitchen.  ~  Nov 03, 2014:  96mo ROV & Hend has had a stable interval- CC is itching on her back, no rash, no new meds etc; states her breathing is ok, DOE w/o change, no CP, min cough & Hycodan prn helps; weight is down 5# to 151# & less edema, reminded to elim all salt from her diet... We reviewed the following medical problems during today's office visit >>     Hypoxemia, PulmHTN> she has HomeO2 (not using it all the time), ProairHFA prn, & Hycodan cough syrup; she feels her breathing is at baseline; last 2DEcho 5/13 w/ PAsys~50 & stable...    HBP> on ASA81, Propran80-1/2Bid, Quinapril40, Lasix40-2Qam, Diamox250-2Qpm, & K20Bid; BP= 118/70 & she denies CP, palpit, ch in SOB, & decr edema w/ 5# wt loss.    AFib, SSS, Tachy-brady> on Coumadin (via CC); seen by DrKlein for Cards/EP in the past...    Venous Insuffic & edema> she had incr ankle edema & improved w/ elim sodium, elev legs, support hose & continue same diuretics...    DM> on diet alone; wt is down to 151# today; BS has been normal ~100...    GI- Divertics, IBS, Polyps> on Immodium prn; she saw DrStark 12/13> IBS, hx divertics & colon polyps, he did not rec  surveillance colonoscopy for her...    GU- bladder symptoms> she has freq & urge/stress incont- trial Oxybutynin10mg  & eval by Urology DrManny> rec changing diuretics if poss but it is not poss...    Hx bilat breast cancers> s/p mastectomies in 1996 & 2002, followed by [facility] & doing well w/o known recurrence...    DJD> on Allopurinol 300, Tramadol50; stable w/ mod DJD & limited mobility (knees, hips)...    Early dementia, anxiety> Aware- family helps w/ her meds & helps at home... We reviewed prob list, meds, xrays and labs> see below for updates >> she is due for PREVNAR-13 shot today...  CXR 5/16 showed mild cardiomeg, clear lungs, no effusion, NAD...   LABS 5/16>  Chems- wnl w/ HCO3=34, Cr=0.90;  CBC- wnl w/ Hg=12.1, Fe=65 (22%sat), Ferritin=113;  TSH=2.65...  IMP/PLAN >> Cheyenne [name] is reasonably stable at age 83- given Prevnar-34 today, meds refilled by request, same meds...   ~  May 10, 2015:  79mo ROV & Cheyenne [name] reports doing well- no new complaints or concerns; Epic review shows several calls for refill Hycodan cough syrup (says she uses it for sleep!=> rec to try TylenolPM & Delsym, not the Hydrocodon);  She says her breathing is improved, denies  SOB/ cough/ sput, CP, palpit, etc;  She uses her O2 w/ activity & she exercises w/ Silver Sneakers once per wk & exercise bike at home...    Hypoxemia, PulmHTN> she has HomeO2 (not using it now), ProairHFA prn, & prev Hycodan cough syrup; she feels her breathing is at baseline; last 2DEcho 5/13 w/ PAsys~50 & stable...    HBP> on ASA81, Propran80-1/2Bid, Accupril40, Lasix40-2Qam, Diamox250-2Qpm, & K20Bid; BP= 134/76 & she denies CP, palpit, ch in SOB, & decr edema w/ 5# wt loss.    AFib, SSS, Tachy-brady> on Coumadin (via CC); seen by DrKlein for Cards/EP in the past; denies CP, palpit, change in SOB/edema...    Venous Insuffic & edema> she had incr ankle edema & improved w/ elim sodium, elev legs, support hose & continue same diuretics...    DM>  on diet alone; wt is down to 144# today; BS has been normal ~100...    GI- Divertics, IBS, Polyps> on Immodium prn; she saw DrStark 12/13> IBS, hx divertics & colon polyps, he did not rec surveillance colonoscopy for her...    GU- bladder symptoms> she has freq & urge/stress incont- trial Oxybutynin10mg  & eval by Urology DrManny> rec changing diuretics if poss but it is not poss...    Hx bilat breast cancers> s/p mastectomies in 1996 & 2002, followed by [facility] & doing well w/o known recurrence...    DJD> on Allopurinol 300, Tramadol50, VitD2000; stable w/ mod DJD & limited mobility (knees, hips); she saw DrZSmith for neck pain, DDD, musc spasm, decr ROM- given exercises, ice/heat, Pennsaid, Tylenol, VitD rx.    Early dementia, anxiety> Aware- family helps w/ her meds & helps at home... EXAM shows Afeb, VSS, O2sat=96% on RA at rest;  Heent- neg, mallampati2;  Chest- clear w/o w/r/r; Heart- RR gr1/6 w/o r/g; Abd- soft, nontender, neg; Ext- VI, trace edema; Neuro- memory prob, no focal abn... IMP/PLAN>>  Cheyenne [name] is stable, same meds, OK 83 Flu vaccine today, we plan ROV 50mo...   ~  September 02, 2015:  61mo ROV & add-on appt requested post ER visit> Cheyenne [name] went to the ER 08/29/15 w/ a "bump" on her vagina- it had been many yrs since she saw a Gyn, note made of her AFib hx and coumadin clinic rx; they noted bruising in her lower abd wall towards the left side & in her vaginal area- initially w/ some mild discomfort but no severe pain or bleeding; she denied any injury but later mentioned her exercise bike w/ uncomfortable seat that comes to a point near her pubis (she rides for 1H nightly while watching [employer]!);  She was evaluated by DrZammit including EXAM that showed neg abd exam x ecchymosis over lower left abd wall and mons pubis, +hematoma described in left labial fold, remainder of exam neg;  LABS showed Hg=11.5, Protime=23.9/ INR=2.15, CT Abd&Pelvis showed 5.2 x 2.7 cm intermediate soft tissue  density in the left perineum deep to the labia suggesting a hematoma;  Coumadin was held & she was asked to follow up w/ Gyn regarding the hematoma & here regarding the coumadin rx...    She saw Dr. Linda Hedges w/ vulvar hematoma resolving on conservative management...    She has held the Coumadin since 3/20, not riding the bike since then either, and she notes that the bruising is slowly diminishing, the "knot" seems to be getting smaller, bruising about the same- she denies any pain at present;  Plan is to continue off Coumadin until her Gyn  check on 09/05/15, and as long as the deep hematoma appears to be resolving then we will resume her anticoag on a sl lower dose to start... EXAM shows Afeb, VSS, O2sat=97% on 2L/min pulse dose;  HEENT- neg, mallampati2;  Chest- clear w/o w/r/r;  Heart- irreg Afib, rate60, gr1/6 SEM w/o r/g;  Abd- soft nontender, bruising in LLQ;  Ext= VI, trace edema;  Neuro- nonfocal... IMP/PLAN>>  Perineum hematoma likely from exercise bike seat trauma + her Coumadin therapy for AFib;  She appears stable off the Coumadin for the past 4d & notes that there is no pain & the "knot" appears to be getting smaller;  Plan is to remain off Coumadin until her Gyn check on 3/27 & if appears satis then we will rec resuming her anticoag rx at that time...   ~  Nov 09, 2015:  63mo ROV & pulmonary/medical recheck>  Her pelvic hematoma has resolved and she is back on Coumadin- followed closely in the CC, labs reviewed;  She has noted some swelling in feet & ankles, incr DOE, some orthopnea;  She is on Lasix40-2Qam + KCl Bid but she is NOT restricting dietary sodium...     EXAM shows Afeb, VSS, O2sat=92% on 2L/min pulse dose;  HEENT- neg, mallampati2;  Chest- clear w/o w/r/r;  Heart- irreg Afib, rate60, gr1/6 SEM w/o r/g;  Abd- soft nontender, bruising in LLQ;  Ext= VI, 2+ edema... IMP/PLAN>>  Cheyenne [name] is generally stable but has incr edema & needs NO SALT diet, continue Lasix80Qam;  She wants to  restart exercise bike & has new seat- so- OK... we plan ROV in 54mo w/ labs etc...  ~  March 12, 2016:  81mo ROV & Cheyenne [name] reports doing well- no new complaints or concerns;  Her breathing is good, uses her port O2 just as needed, notes swelling is diminished  & she's using the support hose    Hypoxemia, PulmHTN> she has HomeO2 (using it prn), ProairHFA prn, & prev Hycodan cough syrup; she feels her breathing is at baseline; last 2DEcho 5/13 w/ PAsys~50 & stable...    HBP> on ASA81, Propran80-1/2Bid, Accupril40, Lasix40-2Qam, Diamox250-2Qpm, & K20Bid; BP= 124/78 & she denies CP, palpit, ch in SOB, & decr edema w/ 5# wt loss; K~3.5-4.1, BWI2~03-55    AFib, SSS, Tachy-brady> on Coumadin (via CC); seen by DrKlein for Cards/EP in the past; denies CP, palpit, change in SOB/edema...    Venous Insuffic & edema> she had incr ankle edema & improved w/ elim sodium, elev legs, support hose & continue same diuretics...    DM> on diet alone; wt is down to 144# today; BS has been normal ~100...    GI- Divertics, IBS, Polyps> on Immodium prn; she saw DrStark 12/13> IBS, hx divertics & colon polyps, he did not rec surveillance colonoscopy for her...    GU- bladder symptoms> she has freq & urge/stress incont- trial Oxybutynin10mg  & eval by Urology DrManny> rec changing diuretics if poss but it is not poss...    Hx bilat breast cancers> s/p mastectomies in 1996 & 2002, followed by [facility] & doing well w/o known recurrence...    DJD> on Allopurinol 300, Tramadol50, VitD2000; stable w/ mod DJD & limited mobility (knees, hips); she saw DrZSmith for neck pain, DDD, musc spasm, decr ROM- given exercises, ice/heat, Pennsaid, Tylenol, VitD rx.    Early dementia, anxiety> Aware- family helps w/ her meds & helps at home... EXAM shows Afeb, VSS, O2sat=96% on RA at rest;  Heent- neg, mallampati2;  Chest-  clear w/o w/r/r; Heart- RR gr1/6 w/o r/g; Abd- soft, nontender, neg; Ext- VI, 1+edema; Neuro- memory prob, no focal  abn... IMP/PLAN>>  Cheyenne [name] is stable, continue same meds, OK Flu shot today...   ~  September 10, 2016:  83mo ROV & Cheyenne [name] reports a quiet interval- breathing well, no new complaints or concerns;  She has portable O2 that she uses w/ activity & exertion but takes it off at rest w/ O2sats in the 90s;  She denies CP, palpit, dizzy, etc but she has VI & +pitting edema despite low sodium diet, support hose, elevation and Demadex/ Diamox diuretic regimen... Problem list reviewed, doing satis & no active issues...     EXAM shows Afeb, VSS, O2sat=99% on RA at rest;  Heent- neg, mallampati2;  Chest- clear w/o w/r/r; Heart- RR gr1/6 w/o r/g; Abd- soft, nontender, neg; Ext- VI, 1+edema; Neuro- memory prob, no focal abn...  LABS 09/10/16>  Chems- ok w/ K=3.9, HCO3=33, BS=95, Cr=0.98;  CBC- wnl w/ Hg=12.2;  TSH=2.79;  BNP=393 IMP/PLAN>>  Cheyenne [name] is remarkably stable given her medical issues;  Rec to continue same meds & call for any problems...           Problem List:  ALLERGIC RHINITIS (ICD-477.9) - on FLONASE Qhs & ZYRTEK QAM Prn.  Hx of HYPOXEMIA (ICD-799.02) - full eval for hypoxemia in [place] by 2DEcho ~  7/08:  CXR= cardiomegaly, ectatic Ao, pulm ven HTN... CTAngio= neg, without PE, min scarring in lingula... 2DEcho= norm LVF w/ EF=55%, LVwall thickness at upper limits, mild ca++ AoV w/ mild AI,  PA sys pressure = 39mmHg... O2 sats 90-94 at rest on RA... w/ ambulation in the office sats drop to 85%... she didn't yet want to go on oxygen at home... just occas SOB w/ activity- she is too sedentary and needs to incr her exercise program. ~  CTChest 6/09 showed bilat mastectomies, no adenopathy, no fluid, 2 sm nodules 4-35mm size RML/LLL, NAD... ~  6/10: c/o increased DOE & no energy- O2 sat= 91% rest & 86% w/activ... rec> home O2 1L/min rest, 2L/min exercise. ~  4/11 hosp> V/Q scan w/ normal vent & norm perfusion... CXR 4/11 showed cardiomeg, clear lungs, no CHF, surg clips in  right axilla... ~  8/11:  pt requesting change to LiqO2 to incr mobility. ~  11/12:  O2 sat= 94% at rest on 2L/min oxygen via ... remains stable. ~  11/13:  O2 sat= 100% on 2L/min at rest... ~  CXR 5/14 showed cardiomeg, clear lungs, DJD spine, NAD ~  08/65:  O2 recert today> O2 sat on RA at rest=96% w/ HR=65/min;  Ambulated 3 laps on RA w/ nadir O2 sat=88% w/ HR=116; Rec to continue O2 w/ exercise & Qhs... ~  CXR 5/15 showed mild cardiomeg, clear lungs, DJD spine, surg clips right axilla, NAD.Marland Kitchen. ~  5/15: Ambulate on RA:  96% O2sat on RA at rest w/ HR=52;  87% ob RA after 1lap w/ HR=109 => continue same O2 therapy ~  11/15: she remains on O2 at 2-3L/min flow; O2sats on 3L/min today = 98%... ~  5/16: she has port O2 at home but states not using it now; O2sat=94% on RA at rest today...  ~  11/16: she remains on O2 at 2L/min pulse dose w/ exercise...  HYPERTENSION (ICD-401.9) - controlled on PROPRANOLOL 80mg Bid, QUINAPRIL 40mg /d; and back on LASIX 40mg AM, KCL 63mEqAM, & ACETAZOLAMIDE 250mg - 2tabsPM... she knows to avoid sodium etc... ~  labs 2/09 showed Na143,  K3.6, CL101, CO2 36, BUN 15, Cr0.9.Marland KitchenMarland Kitchen BNP was 194... ~  labs 6/09 showed lytes normal x TCO2= 37, & BNP= 512... ~  labs 12/09 showed HCO3- 36,  BNP= 586... rec- same meds, no salt! ~  labs 6/10 showed Na= 146, K= 3.6, CO2= 36, BUN= 14, Creat= 0.9, BNP= 336. ~  labs 9/10 showed Na-147, K=3.7, HCO3=36, BUN=18, Creat=0.9, BNP=484 ~  labs 1/11 showed Na=144, K=3.9, HCO3=35, BUN=17, Creat=0.9, BNP=488 ~  [facility] 4/11 & meds changed by TH> off diuretics & KCl... CXR showed cardiomeg, clear lungs, clips right axilla. ~  labs 10/15/09 showed Na=143, K=4.0, HCO3=37, BUN=12, Creat=0.8, BNP=761... restart Lasix, Diamox, KCl (one each). ~  labs 10/25/09 showed HCO3=44, BNP=588... rec> incr Diamox to 2Qpm... ~  labs 8/11 showed HCO3=39, BNP=481... continue same. ~  Labs 5/12 showed HCO3=35, BNP=373... Improved, continue same Rx. ~  Labs 11/12 showed  HCO3=37, BNP=351... Stable continue same meds. ~  5/13: BP= 138/88 & HCO3=39, BUN=17, Creat=0.9, BNP=534... Reminded to take meds daily. ~  11/3: on ASA81, Propran80Bid, Quinapril40, Lasix40, Diamox250-2/d, & K20; BP= 128/82 & she denies CP, palpit, ch in SOB or edema...  ~  5/14: on ASA81, Propran80Bid, Quinapril40, Lasix40, Diamox250-2/d, & K20Bid; BP= 136/80 & she denies CP, palpit, ch in SOB or edema ~  11/14: on ASA81, Propran80-1/2Bid, Quinapril40, Lasix40, Diamox250-2/d, & K20Bid; BP= 142/80 & she remains essentially asymptomatic... ~  5/15: on ASA81, Propran80-1/2Bid, Quinapril40, Lasix40, Diamox250-2/d, & K20Bid; BP= 144/80 & she denies CP, palpit, ch in SOB, but has developed ankle edema & 5# wt gain ~  11/15: on ASA81, Propran80-1/2Bid, Quinapril40, Lasix40, Diamox250-2/d, & K20Bid; BP= 120/80 & she remains minimally symptomatic but minimally active as well... ~  5/16: on ASA81, Propran80-1/2Bid, Quinapril40, Lasix40-2Qam, Diamox250-2Qpm, & K20Bid; BP= 118/70 & she denies CP, palpit, ch in SOB, & decr edema w/ 5# wt loss.  ATRIAL FIBRILLATION (ICD-427.31) & SICK SINUS/ TACHY-BRADY SYNDROME (ICD-427.81) - new prob 1/10 w/ eval by DrKlein- rate control strategy on Propranolol w/ [facility]. ~  7/08:  2DEcho= norm LVF w/ EF=55%, LVwall thickness at upper limits, mild ca++ AoV w/ mild AI,  PA sys pressure = 36mmHg... ~  EKG 1/10 showed AFib, rate~ 60, NSSTTWA... ~  2DEcho 3/10 showed norm LVF w/ EF= 60% & no regional wall motion abn, mild calcif AoV w/ mild AI, mild LA & RA dil w/ incr thickness of interatrial septum c/w lipomatous hypertrophy, PA sys est at 50... ~  Holter monitor 3/10 = AFib w/ rate 45-97... ~  2DEcho 4/11 in hosp showed norm LVwall thickness & EF=55%, mild RVdil w/ mod decr RV function, mod TR & PAsys~69... ~  2DEcho 5/13 showed normal LVF w/ EF=55%, mild AI & MR, mild LA & RA dilatation, PAsys=49... Stable. ~  Stable on Coumadin & followed by DrKlein  w/ rate control strategy...  PULMONARY HYPERTENSION >> Serial 2DEchos showed +- stable PA sys estimates (see above). ~  V/Q lung scan 4/11 was neg for PE... ~  2DEcho 5/13 showed normal LVF w/ EF=55%, mild AI & MR, mild LA & RA dilatation, PAsys=49... Stable. ~  She is way too sedentary but not motivated to exercise etc... ~  Subsequently started exercising on a stationary bike at home...  VENOUS INSUFFICIENCY (ICD-459.81) - mild persist edema despite the sodium restriction, Lasix and Diamox... reviewed recommendation for No Salt, Elevation, TED's, etc... ~  11/15: she presented w/ 10# further wt gain and 4+edema in feet; BMet was OK &  we decided to incr Lasix40 to 2tabsQam, continue other meds 7 recheck labs in 6wks... ~  6/15: she had incr ankle edema & improved w/ elim sodium, elev legs, support hose & continue same diuretics  DIABETES MELLITUS, BORDERLINE (ICD-790.29) - prev on Metformin 500mg /d (held after 4/11 hosp)... ~  last BS=181, HgA1c=6.4 in 11/08... ~  labs 6/09 showed BS= 101, HgA1c= 6.4 ~  labs 12/09 showed BS= 139,  HgA1c= 6.1 ~  labs 6/10 showed BS= 123, A1c= 6.1 ~  7/10: neg ophthal f/u DrHecker- no retinopathy, no macular edema. ~  labs 9/10 showed BS= 93 ~  labs 1/11 showed BS=146, A1c=5.7 ~  labs 5/11 showed BS= 100 on diet alone... ~  labs 8/11 showed BS= 96 ~  Labs 5/12 showed BS= 113, A1c= 5.8 ~  9/12:  Ophthalmology check by DrHecker- no DM retinopathy... ~  Labs 11/12 showed BS= 104 ~  Labs 5/13 showed BS= 118 ~  Labs 11/13 showed BS= 97 ~  2/14: she had a neg Ophthalmology eval by DrHecker... ~  Labs 5/14 showed BS= 102 on diet alone... ~  On diet alone, BS ~100 and stable...  DIVERTICULOSIS OF COLON, IRRITABLE BOWEL SYNDROME & COLONIC POLYPS (ICD-211.3) ~  colonoscopy was 11/06 by DrStark showing divertics and several polyps (one 49mm adenoma)... f/u 22yrs. ~  colonoscopy 1/10 by DrStark showed divertics (severe in sigmoid), 48mm polyp= tub adenoma... f/u 3  yrs. ~  12/13:  She had GI f/u DrStark> IBS w/ freq loose stools, occas BRB, hx extensive divertics & adenomatous polyps, on coumadin for AFib;   URINARY FREQ&URGENCY >>  ~  11/13: she had a GU eval by DrManny> c/o freq & urgency assoc w/ Lasix, some leakage & managing w/ one pad/d, some better w/ Oxybutynin... ~  5/14: she had f/u DrManny, Urology> urinary freq & urgency; worse w/ Lasix; mild leakage & uses 1 pad/d; Oxybutynin caused dry mouth; situation is acceptable & no further meds rec...  BILAT BREAST CANCERS - resected via mastectomies in 1996 and 2002... followed by [facility] annually & seen 8/12> note reviewed. ~  1/15: she had her yearly f/u by [facility]- his note is reviewed, stable, wound over sternum is resolved...   DEGENERATIVE JOINT DISEASE (ICD-715.90) - w/ hx hyperuricemia on ALLOPURINOL 300mg /d & has TRAMADOL 50mg  prn pain...  Hx of HEADACHE (ICD-784.0) ~  MRI Brain 4/11 showed atrophy & sm vessel dis, degen changes at C1-2 w/ some sp stenosis caused part by ant slip of the C1 ring, incidental part empty sella...  ANXIETY (ICD-300.00) - prev rec to take Alpraz 0,5mg  Prn but she never filled the Rx. ~  Remeron 7.5mg /d started 4/11 hosp & stopped 5/11 due to lethargy, sleepiness...   Past Surgical History:  Procedure Laterality Date  . BREAST SURGERY    . CATARACT EXTRACTION    . CHOLECYSTECTOMY    . DOUBLE MASTECTOMY    . KNEE SURGERY  2004   RIGHT  . MASTECTOMY  1996   right breast for breast cancer  . MASTECTOMY  1/02   left - Dr Lucia Gaskins  . TOTAL KNEE ARTHROPLASTY  6/04   right - Dr Telford Nab    Outpatient Encounter Prescriptions as of 09/10/2016  Medication Sig  . acetaZOLAMIDE (DIAMOX) 250 MG tablet TAKE 2 TABLETS BY MOUTH DAILY AT 4PM  . albuterol (PROVENTIL HFA;VENTOLIN HFA) 108 (90 BASE) MCG/ACT inhaler Inhale 1-2 puffs into the lungs every 6 (six) hours as needed for wheezing or shortness of breath.  Marland Kitchen  allopurinol (ZYLOPRIM) 300 MG tablet TAKE 1 TABLET (300  MG TOTAL) BY MOUTH DAILY.  Marland Kitchen aspirin 81 MG tablet Take 81 mg by mouth daily.    . Cholecalciferol (VITAMIN D3) 5000 units CAPS Take 1 capsule by mouth daily.  . furosemide (LASIX) 40 MG tablet TAKE 2 TABLET EVERY MORNING  . KLOR-CON M20 20 MEQ tablet TAKE 1 TABLET BY MOUTH TWICE A DAY  . loperamide (IMODIUM) 2 MG capsule Take 2 mg by mouth as needed for diarrhea or loose stools.   . Multiple Vitamin (MULTIVITAMIN) capsule Take 1 capsule by mouth daily.    . Multiple Vitamins-Minerals (PRESERVISION AREDS PO) Take 1 tablet by mouth daily.  Marland Kitchen oxybutynin (DITROPAN-XL) 10 MG 24 hr tablet TAKE 1 TABLET EVERY DAY FOR BLADDER SPASMS  . propranolol (INDERAL) 80 MG tablet TAKE 1/2 TABLET BY MOUTH TWO TIMES DAILY  . quinapril (ACCUPRIL) 40 MG tablet TAKE 1 TABLET (40 MG TOTAL) BY MOUTH DAILY.  . traMADol (ULTRAM) 50 MG tablet TAKE 1 TABLET THREE TIMES A DAY AS NEEDED FOR PAIN  . warfarin (COUMADIN) 5 MG tablet TAKE AS DIRECTED BY COUMADIN CLINIC  . [DISCONTINUED] warfarin (COUMADIN) 5 MG tablet TAKE AS DIRECTED BY COUMADIN CLINIC   No facility-administered encounter medications on file as of 09/10/2016.     Allergies  Allergen Reactions  . Iohexol      Desc: IV History sheet from prior CT states IV Contrast allergy- 13 hour prep given.     Immunization History  Administered Date(s) Administered  . H1N1 06/07/2008  . Influenza Split 04/24/2011, 04/22/2012  . Influenza Whole 03/08/2009, 06/26/2010  . Influenza, High Dose Seasonal PF 03/12/2016  . Influenza,inj,Quad PF,36+ Mos 04/28/2013, 04/30/2014, 05/10/2015  . Pneumococcal Conjugate-13 11/09/2014  . Pneumococcal Polysaccharide-23 10/10/2009    Current Medications, Allergies, Past Medical History, Past Surgical History, Family History, and Social History were reviewed in [employer] record.    Review of Systems         See HPI - all other systems neg except as noted...  The patient complains of dyspnea on exertion  and peripheral edema.  The patient denies anorexia, fever, weight loss, weight gain, vision loss, decreased hearing, hoarseness, chest pain, syncope, prolonged cough, headaches, hemoptysis, abdominal pain, melena, hematochezia, severe indigestion/heartburn, hematuria, incontinence, muscle weakness, suspicious skin lesions, transient blindness, difficulty walking, depression, unusual weight change, abnormal bleeding, enlarged lymph nodes, and angioedema.     Objective:   Physical Exam      WD, WN, 83 y/o BF in NAD... GENERAL:  Alert & oriented; pleasant & cooperative... HEENT:  [place]/AT, EOM-wnl, PERRLA, EACs-clear, TMs-wnl, NOSE-clear, THROAT-clear & wnl. NECK:  Supple w/ fairROM; no JVD; normal carotid impulses w/o bruits; no thyromegaly or nodules palpated; no lymphadenopathy. CHEST:  Clear to P & A; without wheezes/ rales/ or rhonchi heard... HEART:  irregular rhythm; gr 1/6 SEM without rubs or gallops appreciated... ABDOMEN:  Soft & nontender; normal bowel sounds; no organomegaly or masses detected. EXT: without deformities, mild arthritic changes; no varicose veins/ +venous insuffic/ tr+edema today... NEURO:  CN's intact; no focal neuro deficits... DERM:  There is a rounded lesion left calf c/w ringworm...  RADIOLOGY DATA:  Reviewed in the EPIC EMR & discussed w/ the patient...  LABORATORY DATA:  Reviewed in the EPIC EMR & discussed w/ the patient...   Assessment & Plan:    HYPOXEMIA & CO2 retention>  Stable on her O2 (but only using it prn) & w/ diuretic regimen including Acetazolamide,  continue same... PULM HYPERTENSION ?Etiology>  We rechecked 2DEcho 5/13 & everything looks stable at age 29 compared to 67yrs prev, continue same meds for now. ~  Cheyenne [name] continues to remain stable on curren meds; she doesn't want repeat 2DEcho etc since she is doing so well & stable overall...  HBP>  Controlled on Propranolol, Quinapril, Lasix, & Diamox; continue same...  AFib>  Stable on rate  control strategy, no apparent prob w/ tachy or brady; continue same meds...  Ven Insuffic & Edema>  She is on Lasix40-2Qam, Diamox250-2Qpm, K20Bid; Chems are ok & reminded NO SALT, elevate, TED hose etc...  DM>  Well maintained on diet Rx...  GI>  Divertics, IBS, Polyps>  Stable, we discussed Simethacone Rx for gas...  GU> trial Oxybutynin10 for bladder symptoms and refer to Urology per daugh request...  Hx bilat breast cancers>  Stable, followed by [facility], no known recurrence... Skin lesion on chest wall resolved w/ Rx from [facility]...  Hx PERINEUM HEMATOMA, likely due to exercise bike seat trauma + her coumadin therapy> Coumadin on hold til she can be checked by Gyn; needs a better more comfortable bike seat!  DJD>  Hx hyperuricemia on Allopurinol Rx & no clinical attacks...  Anxiety>  Stable on Prn Alprazolam Rx...   Patient's Medications  New Prescriptions   No medications on file  Previous Medications   ACETAZOLAMIDE (DIAMOX) 250 MG TABLET    TAKE 2 TABLETS BY MOUTH DAILY AT 4PM   ALBUTEROL (PROVENTIL HFA;VENTOLIN HFA) 108 (90 BASE) MCG/ACT INHALER    Inhale 1-2 puffs into the lungs every 6 (six) hours as needed for wheezing or shortness of breath.   ALLOPURINOL (ZYLOPRIM) 300 MG TABLET    TAKE 1 TABLET (300 MG TOTAL) BY MOUTH DAILY.   ASPIRIN 81 MG TABLET    Take 81 mg by mouth daily.     CHOLECALCIFEROL (VITAMIN D3) 5000 UNITS CAPS    Take 1 capsule by mouth daily.   FUROSEMIDE (LASIX) 40 MG TABLET    TAKE 2 TABLET EVERY MORNING   KLOR-CON M20 20 MEQ TABLET    TAKE 1 TABLET BY MOUTH TWICE A DAY   LOPERAMIDE (IMODIUM) 2 MG CAPSULE    Take 2 mg by mouth as needed for diarrhea or loose stools.    MULTIPLE VITAMIN (MULTIVITAMIN) CAPSULE    Take 1 capsule by mouth daily.     MULTIPLE VITAMINS-MINERALS (PRESERVISION AREDS PO)    Take 1 tablet by mouth daily.   OXYBUTYNIN (DITROPAN-XL) 10 MG 24 HR TABLET    TAKE 1 TABLET EVERY DAY FOR BLADDER SPASMS   PROPRANOLOL (INDERAL) 80 MG  TABLET    TAKE 1/2 TABLET BY MOUTH TWO TIMES DAILY   QUINAPRIL (ACCUPRIL) 40 MG TABLET    TAKE 1 TABLET (40 MG TOTAL) BY MOUTH DAILY.   TRAMADOL (ULTRAM) 50 MG TABLET    TAKE 1 TABLET THREE TIMES A DAY AS NEEDED FOR PAIN   WARFARIN (COUMADIN) 5 MG TABLET    TAKE AS DIRECTED BY COUMADIN CLINIC  Modified Medications   No medications on file  Discontinued Medications   WARFARIN (COUMADIN) 5 MG TABLET    TAKE AS DIRECTED BY COUMADIN CLINIC

## 2016-09-13 ENCOUNTER — Other Ambulatory Visit: Payer: Self-pay | Admitting: Pulmonary Disease

## 2016-09-25 MED ORDER — OXYBUTYNIN CHLORIDE ER 10 MG PO TB24: ORAL_TABLET | ORAL | 3 refills | Status: DC

## 2016-09-25 MED ORDER — FUROSEMIDE 40 MG PO TABS: ORAL_TABLET | ORAL | 3 refills | Status: DC

## 2016-10-03 ENCOUNTER — Ambulatory Visit (INDEPENDENT_AMBULATORY_CARE_PROVIDER_SITE_OTHER): Payer: Medicare Other | Admitting: *Deleted

## 2016-10-03 DIAGNOSIS — Z5181 Encounter for therapeutic drug level monitoring: Secondary | ICD-10-CM | POA: Diagnosis not present

## 2016-10-03 DIAGNOSIS — I4891 Unspecified atrial fibrillation: Secondary | ICD-10-CM

## 2016-10-03 DIAGNOSIS — Z7901 Long term (current) use of anticoagulants: Secondary | ICD-10-CM | POA: Diagnosis not present

## 2016-10-03 LAB — POCT INR: INR: 2.2

## 2016-10-27 ENCOUNTER — Other Ambulatory Visit: Payer: Self-pay | Admitting: Pulmonary Disease

## 2016-10-31 ENCOUNTER — Telehealth: Payer: Self-pay | Admitting: *Deleted

## 2016-10-31 NOTE — Telephone Encounter (Signed)
Spoke with pt and she states she is dizzy this morning and does not feel like coming to her appt today Pt instructed to call PCP regarding the dizziness and her appt  was changed in coumadin clinic to Friday May 25th and she states understanding

## 2016-11-02 ENCOUNTER — Ambulatory Visit (INDEPENDENT_AMBULATORY_CARE_PROVIDER_SITE_OTHER): Payer: Medicare Other | Admitting: Pharmacist

## 2016-11-02 DIAGNOSIS — I4891 Unspecified atrial fibrillation: Secondary | ICD-10-CM

## 2016-11-02 DIAGNOSIS — Z5181 Encounter for therapeutic drug level monitoring: Secondary | ICD-10-CM | POA: Diagnosis not present

## 2016-11-02 DIAGNOSIS — Z7901 Long term (current) use of anticoagulants: Secondary | ICD-10-CM | POA: Diagnosis not present

## 2016-11-02 LAB — POCT INR: INR: 2.3

## 2016-12-10 ENCOUNTER — Telehealth: Payer: Self-pay | Admitting: Pulmonary Disease

## 2016-12-10 DIAGNOSIS — H9190 Unspecified hearing loss, unspecified ear: Secondary | ICD-10-CM

## 2016-12-10 NOTE — Telephone Encounter (Signed)
Spoke with pt. Pt is requesting to be referred to an ENT or an Audiologist. Reports having hearing loss.  SN - please advise. Thanks.

## 2016-12-10 NOTE — Telephone Encounter (Signed)
[id number] x1 

## 2016-12-10 NOTE — Telephone Encounter (Signed)
Per SN---  Ok to refer to ENT for hearing eval.  Thanks

## 2016-12-11 NOTE — Telephone Encounter (Signed)
Referral placed to ENT.  Pt aware.  Nothing further needed.

## 2016-12-13 ENCOUNTER — Ambulatory Visit (INDEPENDENT_AMBULATORY_CARE_PROVIDER_SITE_OTHER): Payer: Medicare Other | Admitting: *Deleted

## 2016-12-13 DIAGNOSIS — Z5181 Encounter for therapeutic drug level monitoring: Secondary | ICD-10-CM | POA: Diagnosis not present

## 2016-12-13 DIAGNOSIS — I4891 Unspecified atrial fibrillation: Secondary | ICD-10-CM

## 2016-12-13 DIAGNOSIS — Z7901 Long term (current) use of anticoagulants: Secondary | ICD-10-CM | POA: Diagnosis not present

## 2016-12-13 LAB — POCT INR: INR: 3.5

## 2016-12-18 ENCOUNTER — Encounter: Payer: Self-pay | Admitting: Pulmonary Disease

## 2016-12-18 ENCOUNTER — Ambulatory Visit (INDEPENDENT_AMBULATORY_CARE_PROVIDER_SITE_OTHER): Payer: Medicare Other | Admitting: Pulmonary Disease

## 2016-12-18 DIAGNOSIS — R58 Hemorrhage, not elsewhere classified: Secondary | ICD-10-CM

## 2016-12-18 NOTE — Progress Notes (Signed)
Subjective:    Patient ID: Cheyenne [name], female    DOB: 01-07-1926, 83 y.o.   MRN: [id number]  HPI 83 y/o BF here for a follow up visit... she has multiple medical problems as noted below>> ~  SEE PREV EPIC NOTES FOR OLDER DATA >>    LABS 11/13:  Chems- ok w/ K=3.7 CO2=34 BUN=23 Creat=0.9;  BNP=470...   CXR 5/14 showed cardiomeg, clear lungs, DJD spine, NAD...  LABS 5/14:  Chems- ok x K=3.4;  CBC- ok w/ Hg=12.3;  TSH=1.38;  VitD=31;  BNP=463...  CXR 5/15 showed mild cardiomeg, clear lungs, DJD spine, surg clips right axilla, NAD...  LABS 5/15:  Chems- ok x K=3.4 on K20/d & asked to incr back to Bid;    Ambulate on RA:  96% O2sat on RA at rest w/ HR=52;  87% on RA after 1lap w/ HR=109 => continue same O2 therapy...   LABS 11/15:  BMet- ok w/ K=4.1, HCO3=33, BUN=16, Cr=0.9.Marland Kitchen.  ~  Nov 03, 2014:  61mo ROV & Cheyenne [name] has had a stable interval- CC is itching on her back, no rash, no new meds etc; states her breathing is ok, DOE w/o change, no CP, min cough & Hycodan prn helps; weight is down 5# to 151# & less edema, reminded to elim all salt from her diet... We reviewed the following medical problems during today's office visit >>     Hypoxemia, PulmHTN> she has HomeO2 (not using it all the time), ProairHFA prn, & Hycodan cough syrup; she feels her breathing is at baseline; last 2DEcho 5/13 w/ PAsys~50 & stable...    HBP> on ASA81, Propran80-1/2Bid, Quinapril40, Lasix40-2Qam, Diamox250-2Qpm, & K20Bid; BP= 118/70 & she denies CP, palpit, ch in SOB, & decr edema w/ 5# wt loss.    AFib, SSS, Tachy-brady> on Coumadin (via CC); seen by DrKlein for Cards/EP in the past...    Venous Insuffic & edema> she had incr ankle edema & improved w/ elim sodium, elev legs, support hose & continue same diuretics...    DM> on diet alone; wt is down to 151# today; BS has been normal ~100...    GI- Divertics, IBS, Polyps> on Immodium prn; she saw DrStark 12/13> IBS, hx divertics & colon polyps, he did not rec  surveillance colonoscopy for her...    GU- bladder symptoms> she has freq & urge/stress incont- trial Oxybutynin10mg  & eval by Urology DrManny> rec changing diuretics if poss but it is not poss...    Hx bilat breast cancers> s/p mastectomies in 1996 & 2002, followed by [facility] & doing well w/o known recurrence...    DJD> on Allopurinol 300, Tramadol50; stable w/ mod DJD & limited mobility (knees, hips)...    Early dementia, anxiety> Aware- family helps w/ her meds & helps at home... We reviewed prob list, meds, xrays and labs> see below for updates >> she is due for PREVNAR-13 shot today...  CXR 5/16 showed mild cardiomeg, clear lungs, no effusion, NAD...   LABS 5/16>  Chems- wnl w/ HCO3=34, Cr=0.90;  CBC- wnl w/ Hg=12.1, Fe=65 (22%sat), Ferritin=113;  TSH=2.65...  IMP/PLAN >> Yocelin is reasonably stable at age 49- given Prevnar-78 today, meds refilled by request, same meds...   ~  May 10, 2015:  64mo ROV & Cheyenne [name] reports doing well- no new complaints or concerns; Epic review shows several calls for refill Hycodan cough syrup (says she uses it for sleep!=> rec to try TylenolPM & Delsym, not the Hydrocodon);  She says her breathing is improved, denies  SOB/ cough/ sput, CP, palpit, etc;  She uses her O2 w/ activity & she exercises w/ Silver Sneakers once per wk & exercise bike at home...    Hypoxemia, PulmHTN> she has HomeO2 (not using it now), ProairHFA prn, & prev Hycodan cough syrup; she feels her breathing is at baseline; last 2DEcho 5/13 w/ PAsys~50 & stable...    HBP> on ASA81, Propran80-1/2Bid, Accupril40, Lasix40-2Qam, Diamox250-2Qpm, & K20Bid; BP= 134/76 & she denies CP, palpit, ch in SOB, & decr edema w/ 5# wt loss.    AFib, SSS, Tachy-brady> on Coumadin (via CC); seen by DrKlein for Cards/EP in the past; denies CP, palpit, change in SOB/edema...    Venous Insuffic & edema> she had incr ankle edema & improved w/ elim sodium, elev legs, support hose & continue same diuretics...    DM>  on diet alone; wt is down to 144# today; BS has been normal ~100...    GI- Divertics, IBS, Polyps> on Immodium prn; she saw DrStark 12/13> IBS, hx divertics & colon polyps, he did not rec surveillance colonoscopy for her...    GU- bladder symptoms> she has freq & urge/stress incont- trial Oxybutynin10mg  & eval by Urology DrManny> rec changing diuretics if poss but it is not poss...    Hx bilat breast cancers> s/p mastectomies in 1996 & 2002, followed by [facility] & doing well w/o known recurrence...    DJD> on Allopurinol 300, Tramadol50, VitD2000; stable w/ mod DJD & limited mobility (knees, hips); she saw DrZSmith for neck pain, DDD, musc spasm, decr ROM- given exercises, ice/heat, Pennsaid, Tylenol, VitD rx.    Early dementia, anxiety> Aware- family helps w/ her meds & helps at home... EXAM shows Afeb, VSS, O2sat=96% on RA at rest;  Heent- neg, mallampati2;  Chest- clear w/o w/r/r; Heart- RR gr1/6 w/o r/g; Abd- soft, nontender, neg; Ext- VI, trace edema; Neuro- memory prob, no focal abn... IMP/PLAN>>  Cheyenne [name] is stable, same meds, OK 2016 Flu vaccine today, we plan ROV 50mo...   ~  September 02, 2015:  61mo ROV & add-on appt requested post ER visit> Cheyenne [name] went to the ER 08/29/15 w/ a "bump" on her vagina- it had been many yrs since she saw a Gyn, note made of her AFib hx and coumadin clinic rx; they noted bruising in her lower abd wall towards the left side & in her vaginal area- initially w/ some mild discomfort but no severe pain or bleeding; she denied any injury but later mentioned her exercise bike w/ uncomfortable seat that comes to a point near her pubis (she rides for 1H nightly while watching Cheyenne [name]!);  She was evaluated by DrZammit including EXAM that showed neg abd exam x ecchymosis over lower left abd wall and mons pubis, +hematoma described in left labial fold, remainder of exam neg;  LABS showed Hg=11.5, Protime=23.9/ INR=2.15, CT Abd&Pelvis showed 5.2 x 2.7 cm intermediate soft tissue  density in the left perineum deep to the labia suggesting a hematoma;  Coumadin was held & she was asked to follow up w/ Gyn regarding the hematoma & here regarding the coumadin rx...    She saw Dr. Linda Hedges w/ vulvar hematoma resolving on conservative management...    She has held the Coumadin since 3/20, not riding the bike since then either, and she notes that the bruising is slowly diminishing, the "knot" seems to be getting smaller, bruising about the same- she denies any pain at present;  Plan is to continue off Coumadin until her Gyn  check on 09/05/15, and as long as the deep hematoma appears to be resolving then we will resume her anticoag on a sl lower dose to start... EXAM shows Afeb, VSS, O2sat=97% on 2L/min pulse dose;  HEENT- neg, mallampati2;  Chest- clear w/o w/r/r;  Heart- irreg Afib, rate60, gr1/6 SEM w/o r/g;  Abd- soft nontender, bruising in LLQ;  Ext= VI, trace edema;  Neuro- nonfocal... IMP/PLAN>>  Perineum hematoma likely from exercise bike seat trauma + her Coumadin therapy for AFib;  She appears stable off the Coumadin for the past 4d & notes that there is no pain & the "knot" appears to be getting smaller;  Plan is to remain off Coumadin until her Gyn check on 3/27 & if appears satis then we will rec resuming her anticoag rx at that time...   ~  Nov 09, 2015:  63mo ROV & pulmonary/medical recheck>  Her pelvic hematoma has resolved and she is back on Coumadin- followed closely in the CC, labs reviewed;  She has noted some swelling in feet & ankles, incr DOE, some orthopnea;  She is on Lasix40-2Qam + KCl Bid but she is NOT restricting dietary sodium...     EXAM shows Afeb, VSS, O2sat=92% on 2L/min pulse dose;  HEENT- neg, mallampati2;  Chest- clear w/o w/r/r;  Heart- irreg Afib, rate60, gr1/6 SEM w/o r/g;  Abd- soft nontender, bruising in LLQ;  Ext= VI, 2+ edema... IMP/PLAN>>  Cheyenne [name] is generally stable but has incr edema & needs NO SALT diet, continue Lasix80Qam;  She wants to  restart exercise bike & has new seat- so- OK... we plan ROV in 54mo w/ labs etc...  ~  March 12, 2016:  81mo ROV & Cheyenne [name] reports doing well- no new complaints or concerns;  Her breathing is good, uses her port O2 just as needed, notes swelling is diminished  & she's using the support hose    Hypoxemia, PulmHTN> she has HomeO2 (using it prn), ProairHFA prn, & prev Hycodan cough syrup; she feels her breathing is at baseline; last 2DEcho 5/13 w/ PAsys~50 & stable...    HBP> on ASA81, Propran80-1/2Bid, Accupril40, Lasix40-2Qam, Diamox250-2Qpm, & K20Bid; BP= 124/78 & she denies CP, palpit, ch in SOB, & decr edema w/ 5# wt loss; K~3.5-4.1, BWI2~03-55    AFib, SSS, Tachy-brady> on Coumadin (via CC); seen by DrKlein for Cards/EP in the past; denies CP, palpit, change in SOB/edema...    Venous Insuffic & edema> she had incr ankle edema & improved w/ elim sodium, elev legs, support hose & continue same diuretics...    DM> on diet alone; wt is down to 144# today; BS has been normal ~100...    GI- Divertics, IBS, Polyps> on Immodium prn; she saw DrStark 12/13> IBS, hx divertics & colon polyps, he did not rec surveillance colonoscopy for her...    GU- bladder symptoms> she has freq & urge/stress incont- trial Oxybutynin10mg  & eval by Urology DrManny> rec changing diuretics if poss but it is not poss...    Hx bilat breast cancers> s/p mastectomies in 1996 & 2002, followed by [facility] & doing well w/o known recurrence...    DJD> on Allopurinol 300, Tramadol50, VitD2000; stable w/ mod DJD & limited mobility (knees, hips); she saw DrZSmith for neck pain, DDD, musc spasm, decr ROM- given exercises, ice/heat, Pennsaid, Tylenol, VitD rx.    Early dementia, anxiety> Aware- family helps w/ her meds & helps at home... EXAM shows Afeb, VSS, O2sat=96% on RA at rest;  Heent- neg, mallampati2;  Chest-  clear w/o w/r/r; Heart- RR gr1/6 w/o r/g; Abd- soft, nontender, neg; Ext- VI, 1+edema; Neuro- memory prob, no focal  abn... IMP/PLAN>>  Cheyenne [name] is stable, continue same meds, OK Flu shot today...  ~  September 10, 2016:  66mo ROV & Cheyenne [name] reports a quiet interval- breathing well, no new complaints or concerns;  She has portable O2 that she uses w/ activity & exertion but takes it off at rest w/ O2sats in the 90s;  She denies CP, palpit, dizzy, etc but she has VI & +pitting edema despite low sodium diet, support hose, elevation and Demadex/ Diamox diuretic regimen... Problem list reviewed, doing satis & no active issues...     EXAM shows Afeb, VSS, O2sat=99% on RA at rest;  Heent- neg, mallampati2;  Chest- clear w/o w/r/r; Heart- RR gr1/6 w/o r/g; Abd- soft, nontender, neg; Ext- VI, 1+edema; Neuro- memory prob, no focal abn...  LABS 09/10/16>  Chems- ok w/ K=3.9, HCO3=33, BS=95, Cr=0.98;  CBC- wnl w/ Hg=12.2;  TSH=2.79;  BNP=393 IMP/PLAN>>  Markelle is remarkably stable given her medical issues;  Rec to continue same meds & call for any problems...   ~  December 18, 2016:  70mo ROV & add-on appt requested for bruising on left hand>  Pt tells me that about 1wk ago she noticed a knot on the dorsum of her left hand, then developed a bruise that extended down her fingers & covered the back of her hand; she denies known trauma but the knot sounds like a small hematoma=> ecchmosis;  She denies pain, warmth, drainage, etc;  Recall hx vulvar hematoma 08/2015 due to trauma from exercise bike seat, it resolved w/ conservative measures;  She is on Coumadin for AFib & her last 3 protimes showed INR 2.2-2.3, but late last week it was 3.5 & dose decreased by the clinic staff; pt/ daugh report otherw she is stable- doing satis...    EXAM shows Afeb, VSS, O2sat=96% on RA at rest;  Wt decr to 134#;  Heent- neg, mallampati2;  Chest- clear w/o w/r/r; Heart- RR gr1/6 w/o r/g; Abd- soft, nontender, neg; Ext- VI, 1+edema; Neuro- memory prob, no focal abn...  LAB 12/13/16>  Coumadin clinic PT/INR=3.5 and her dose was decr to 1x5d=MWThFSu, and 1.5tabs  x2d=TSa... IMP/PLAN>>  She has had another mild bleeding episode- this one an ecchymosis on her left hand, no known trauma; she is asymptomatic- discussed cool compresses etc if needed; Coumadin clinic will watch her carefully & I have asked her to maintain her regular f/u appts...            Problem List:  ALLERGIC RHINITIS (ICD-477.9) - on FLONASE Qhs & ZYRTEK QAM Prn.  Hx of HYPOXEMIA (ICD-799.02) - full eval for hypoxemia in [place] by 2DEcho ~  7/08:  CXR= cardiomegaly, ectatic Ao, pulm ven HTN... CTAngio= neg, without PE, min scarring in lingula... 2DEcho= norm LVF w/ EF=55%, LVwall thickness at upper limits, mild ca++ AoV w/ mild AI,  PA sys pressure = 71mmHg... O2 sats 90-94 at rest on RA... w/ ambulation in the office sats drop to 85%... she didn't yet want to go on oxygen at home... just occas SOB w/ activity- she is too sedentary and needs to incr her exercise program. ~  CTChest 6/09 showed bilat mastectomies, no adenopathy, no fluid, 2 sm nodules 4-64mm size RML/LLL, NAD... ~  6/10: c/o increased DOE & no energy- O2 sat= 91% rest & 86% w/activ... rec> home O2 1L/min rest, 2L/min exercise. ~  4/11 hosp> V/Q scan w/ normal vent & norm perfusion... CXR 4/11 showed cardiomeg, clear lungs, no CHF, surg clips in right axilla... ~  8/11:  pt requesting change to LiqO2 to incr mobility. ~  11/12:  O2 sat= 94% at rest on 2L/min oxygen via [place]... remains stable. ~  11/13:  O2 sat= 100% on 2L/min at rest... ~  CXR 5/14 showed cardiomeg, clear lungs, DJD spine, NAD ~  16/10:  O2 recert today> O2 sat on RA at rest=96% w/ HR=65/min;  Ambulated 3 laps on RA w/ nadir O2 sat=88% w/ HR=116; Rec to continue O2 w/ exercise & Qhs... ~  CXR 5/15 showed mild cardiomeg, clear lungs, DJD spine, surg clips right axilla, NAD.Marland Kitchen. ~  5/15: Ambulate on RA:  96% O2sat on RA at rest w/ HR=52;  87% ob RA after 1lap w/ HR=109 => continue same O2 therapy ~  11/15: she remains on O2 at  2-3L/min flow; O2sats on 3L/min today = 98%... ~  5/16: she has port O2 at home but states not using it now; O2sat=94% on RA at rest today...  ~  11/16: she remains on O2 at 2L/min pulse dose w/ exercise...  HYPERTENSION (ICD-401.9) - controlled on PROPRANOLOL 80mg Bid, QUINAPRIL 40mg /d; and back on LASIX 40mg AM, KCL 69mEqAM, & ACETAZOLAMIDE 250mg - 2tabsPM... she knows to avoid sodium etc... ~  labs 2/09 showed Na143, K3.6, CL101, CO2 36, BUN 15, Cr0.9.Marland KitchenMarland Kitchen BNP was 194... ~  labs 6/09 showed lytes normal x TCO2= 37, & BNP= 512... ~  labs 12/09 showed HCO3- 36,  BNP= 586... rec- same meds, no salt! ~  labs 6/10 showed Na= 146, K= 3.6, CO2= 36, BUN= 14, Creat= 0.9, BNP= 336. ~  labs 9/10 showed Na-147, K=3.7, HCO3=36, BUN=18, Creat=0.9, BNP=484 ~  labs 1/11 showed Na=144, K=3.9, HCO3=35, BUN=17, Creat=0.9, BNP=488 ~  [facility] 4/11 & meds changed by TH> off diuretics & KCl... CXR showed cardiomeg, clear lungs, clips right axilla. ~  labs 10/15/09 showed Na=143, K=4.0, HCO3=37, BUN=12, Creat=0.8, BNP=761... restart Lasix, Diamox, KCl (one each). ~  labs 10/25/09 showed HCO3=44, BNP=588... rec> incr Diamox to 2Qpm... ~  labs 8/11 showed HCO3=39, BNP=481... continue same. ~  Labs 5/12 showed HCO3=35, BNP=373... Improved, continue same Rx. ~  Labs 11/12 showed HCO3=37, BNP=351... Stable continue same meds. ~  5/13: BP= 138/88 & HCO3=39, BUN=17, Creat=0.9, BNP=534... Reminded to take meds daily. ~  11/3: on ASA81, Propran80Bid, Quinapril40, Lasix40, Diamox250-2/d, & K20; BP= 128/82 & she denies CP, palpit, ch in SOB or edema...  ~  5/14: on ASA81, Propran80Bid, Quinapril40, Lasix40, Diamox250-2/d, & K20Bid; BP= 136/80 & she denies CP, palpit, ch in SOB or edema ~  11/14: on ASA81, Propran80-1/2Bid, Quinapril40, Lasix40, Diamox250-2/d, & K20Bid; BP= 142/80 & she remains essentially asymptomatic... ~  5/15: on ASA81, Propran80-1/2Bid, Quinapril40, Lasix40, Diamox250-2/d, & K20Bid; BP= 144/80 & she denies CP,  palpit, ch in SOB, but has developed ankle edema & 5# wt gain ~  11/15: on ASA81, Propran80-1/2Bid, Quinapril40, Lasix40, Diamox250-2/d, & K20Bid; BP= 120/80 & she remains minimally symptomatic but minimally active as well... ~  5/16: on ASA81, Propran80-1/2Bid, Quinapril40, Lasix40-2Qam, Diamox250-2Qpm, & K20Bid; BP= 118/70 & she denies CP, palpit, ch in SOB, & decr edema w/ 5# wt loss.  ATRIAL FIBRILLATION (ICD-427.31) & SICK SINUS/ TACHY-BRADY SYNDROME (ICD-427.81) - new prob 1/10 w/ eval by DrKlein- rate control strategy on Propranolol w/ Coumadin via [facility]. ~  7/08:  2DEcho= norm LVF w/ EF=55%, LVwall thickness at upper limits, mild  ca++ AoV w/ mild AI,  PA sys pressure = 40mmHg... ~  EKG 1/10 showed AFib, rate~ 60, NSSTTWA... ~  2DEcho 3/10 showed norm LVF w/ EF= 60% & no regional wall motion abn, mild calcif AoV w/ mild AI, mild LA & RA dil w/ incr thickness of interatrial septum c/w lipomatous hypertrophy, PA sys est at 50... ~  Holter monitor 3/10 = AFib w/ rate 45-97... ~  2DEcho 4/11 in hosp showed norm LVwall thickness & EF=55%, mild RVdil w/ mod decr RV function, mod TR & PAsys~69... ~  2DEcho 5/13 showed normal LVF w/ EF=55%, mild AI & MR, mild LA & RA dilatation, PAsys=49... Stable. ~  Stable on Coumadin & followed by DrKlein w/ rate control strategy... ~  08/2015> Cheyenne [name] had a vulvar hematoma caused by trauma from an exercise bike seat in conjunction w/ her coumadin=> it was held tempoarily & later restarted... ~  12/2016> She developed an ecchymosis on dorsum of left hand, no known trauma but INR was 3.5;  Coumadin adjusted by CC...  PULMONARY HYPERTENSION >> Serial 2DEchos showed +- stable PA sys estimates (see above). ~  V/Q lung scan 4/11 was neg for PE... ~  2DEcho 5/13 showed normal LVF w/ EF=55%, mild AI & MR, mild LA & RA dilatation, PAsys=49... Stable. ~  She is way too sedentary but not motivated to exercise etc... ~  Subsequently started exercising on a  stationary bike at home...  VENOUS INSUFFICIENCY (ICD-459.81) - mild persist edema despite the sodium restriction, Lasix and Diamox... reviewed recommendation for No Salt, Elevation, TED's, etc... ~  11/15: she presented w/ 10# further wt gain and 4+edema in feet; BMet was OK & we decided to incr Lasix40 to 2tabsQam, continue other meds 7 recheck labs in 6wks... ~  6/15: she had incr ankle edema & improved w/ elim sodium, elev legs, support hose & continue same diuretics  DIABETES MELLITUS, BORDERLINE (ICD-790.29) - prev on Metformin 500mg /d (held after 4/11 hosp)... ~  last BS=181, HgA1c=6.4 in 11/08... ~  labs 6/09 showed BS= 101, HgA1c= 6.4 ~  labs 12/09 showed BS= 139,  HgA1c= 6.1 ~  labs 6/10 showed BS= 123, A1c= 6.1 ~  7/10: neg ophthal f/u DrHecker- no retinopathy, no macular edema. ~  labs 9/10 showed BS= 93 ~  labs 1/11 showed BS=146, A1c=5.7 ~  labs 5/11 showed BS= 100 on diet alone... ~  labs 8/11 showed BS= 96 ~  Labs 5/12 showed BS= 113, A1c= 5.8 ~  9/12:  Ophthalmology check by DrHecker- no DM retinopathy... ~  Labs 11/12 showed BS= 104 ~  Labs 5/13 showed BS= 118 ~  Labs 11/13 showed BS= 97 ~  2/14: she had a neg Ophthalmology eval by DrHecker... ~  Labs 5/14 showed BS= 102 on diet alone... ~  On diet alone, BS ~100 and stable...  DIVERTICULOSIS OF COLON, IRRITABLE BOWEL SYNDROME & COLONIC POLYPS (ICD-211.3) ~  colonoscopy was 11/06 by DrStark showing divertics and several polyps (one 44mm adenoma)... f/u 85yrs. ~  colonoscopy 1/10 by DrStark showed divertics (severe in sigmoid), 35mm polyp= tub adenoma... f/u 3 yrs. ~  12/13:  She had GI f/u DrStark> IBS w/ freq loose stools, occas BRB, hx extensive divertics & adenomatous polyps, on coumadin for AFib;   URINARY FREQ&URGENCY >>  ~  11/13: she had a GU eval by DrManny> c/o freq & urgency assoc w/ Lasix, some leakage & managing w/ one pad/d, some better w/ Oxybutynin... ~  5/14: she had f/u  DrManny, Urology> urinary freq &  urgency; worse w/ Lasix; mild leakage & uses 1 pad/d; Oxybutynin caused dry mouth; situation is acceptable & no further meds rec...  BILAT BREAST CANCERS - resected via mastectomies in 1996 and 2002... followed by [facility] annually & seen 8/12> note reviewed. ~  1/15: she had her yearly f/u by [facility]- his note is reviewed, stable, wound over sternum is resolved...   DEGENERATIVE JOINT DISEASE (ICD-715.90) - w/ hx hyperuricemia on ALLOPURINOL 300mg /d & has TRAMADOL 50mg  prn pain...  Hx of HEADACHE (ICD-784.0) ~  MRI Brain 4/11 showed atrophy & sm vessel dis, degen changes at C1-2 w/ some sp stenosis caused part by ant slip of the C1 ring, incidental part empty sella...  ANXIETY (ICD-300.00) - prev rec to take Alpraz 0,5mg  Prn but she never filled the Rx. ~  Remeron 7.5mg /d started 4/11 hosp & stopped 5/11 due to lethargy, sleepiness...   Past Surgical History:  Procedure Laterality Date  . BREAST SURGERY    . CATARACT EXTRACTION    . CHOLECYSTECTOMY    . DOUBLE MASTECTOMY    . KNEE SURGERY  2004   RIGHT  . MASTECTOMY  1996   right breast for breast cancer  . MASTECTOMY  1/02   left - Dr Lucia Gaskins  . TOTAL KNEE ARTHROPLASTY  6/04   right - Dr Telford Nab    Outpatient Encounter Prescriptions as of 12/18/2016  Medication Sig  . acetaZOLAMIDE (DIAMOX) 250 MG tablet TAKE 2 TABLETS BY MOUTH DAILY AT 4PM  . albuterol (PROVENTIL HFA;VENTOLIN HFA) 108 (90 BASE) MCG/ACT inhaler Inhale 1-2 puffs into the lungs every 6 (six) hours as needed for wheezing or shortness of breath.  . allopurinol (ZYLOPRIM) 300 MG tablet TAKE 1 TABLET (300 MG TOTAL) BY MOUTH DAILY.  Marland Kitchen aspirin 81 MG tablet Take 81 mg by mouth daily.    . Cholecalciferol (VITAMIN D3) 5000 units CAPS Take 1 capsule by mouth daily.  . furosemide (LASIX) 40 MG tablet TAKE 2 TABLET EVERY MORNING  . KLOR-CON M20 20 MEQ tablet TAKE 1 TABLET BY MOUTH TWICE A DAY  . loperamide (IMODIUM) 2 MG capsule Take 2 mg by mouth as needed for diarrhea or  loose stools.   . Multiple Vitamin (MULTIVITAMIN) capsule Take 1 capsule by mouth daily.    . Multiple Vitamins-Minerals (PRESERVISION AREDS PO) Take 1 tablet by mouth daily.  Marland Kitchen oxybutynin (DITROPAN-XL) 10 MG 24 hr tablet TAKE 1 TABLET EVERY DAY FOR BLADDER SPASMS  . propranolol (INDERAL) 80 MG tablet TAKE 1/2 TABLET BY MOUTH TWO TIMES DAILY  . quinapril (ACCUPRIL) 40 MG tablet TAKE 1 TABLET (40 MG TOTAL) BY MOUTH DAILY.  Marland Kitchen warfarin (COUMADIN) 5 MG tablet TAKE AS DIRECTED BY COUMADIN CLINIC  . traMADol (ULTRAM) 50 MG tablet TAKE 1 TABLET THREE TIMES A DAY AS NEEDED FOR PAIN (Patient not taking: Reported on 12/18/2016)  . [DISCONTINUED] quinapril (ACCUPRIL) 40 MG tablet TAKE 1 TABLET (40 MG TOTAL) BY MOUTH DAILY. (Patient not taking: Reported on 12/18/2016)   No facility-administered encounter medications on file as of 12/18/2016.     Allergies  Allergen Reactions  . Iohexol      Desc: IV History sheet from prior CT states IV Contrast allergy- 13 hour prep given.     Immunization History  Administered Date(s) Administered  . H1N1 06/07/2008  . Influenza Split 04/24/2011, 04/22/2012  . Influenza Whole 03/08/2009, 06/26/2010  . Influenza, High Dose Seasonal PF 03/12/2016  . Influenza,inj,Quad PF,36+ Mos 04/28/2013, 04/30/2014, 05/10/2015  .  Pneumococcal Conjugate-13 11/09/2014  . Pneumococcal Polysaccharide-23 10/10/2009    Current Medications, Allergies, Past Medical History, Past Surgical History, Family History, and Social History were reviewed in [employer] record.    Review of Systems         See HPI - all other systems neg except as noted...  The patient complains of dyspnea on exertion and peripheral edema.  The patient denies anorexia, fever, weight loss, weight gain, vision loss, decreased hearing, hoarseness, chest pain, syncope, prolonged cough, headaches, hemoptysis, abdominal pain, melena, hematochezia, severe indigestion/heartburn, hematuria,  incontinence, muscle weakness, suspicious skin lesions, transient blindness, difficulty walking, depression, unusual weight change, abnormal bleeding, enlarged lymph nodes, and angioedema.     Objective:   Physical Exam      WD, WN, 83 y/o BF in NAD... GENERAL:  Alert & oriented; pleasant & cooperative... HEENT:  [place]/AT, EOM-wnl, PERRLA, EACs-clear, TMs-wnl, NOSE-clear, THROAT-clear & wnl. NECK:  Supple w/ fairROM; no JVD; normal carotid impulses w/o bruits; no thyromegaly or nodules palpated; no lymphadenopathy. CHEST:  Clear to P & A; without wheezes/ rales/ or rhonchi heard... HEART:  irregular rhythm; gr 1/6 SEM without rubs or

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
## 2016-12-26 ENCOUNTER — Ambulatory Visit (INDEPENDENT_AMBULATORY_CARE_PROVIDER_SITE_OTHER): Payer: Medicare Other | Admitting: *Deleted

## 2016-12-26 DIAGNOSIS — I4891 Unspecified atrial fibrillation: Secondary | ICD-10-CM

## 2016-12-26 DIAGNOSIS — Z5181 Encounter for therapeutic drug level monitoring: Secondary | ICD-10-CM

## 2016-12-26 DIAGNOSIS — Z7901 Long term (current) use of anticoagulants: Secondary | ICD-10-CM

## 2016-12-26 LAB — POCT INR: INR: 2.6

## 2016-12-30 ENCOUNTER — Other Ambulatory Visit: Payer: Self-pay | Admitting: Internal Medicine

## 2017-01-10 ENCOUNTER — Ambulatory Visit (INDEPENDENT_AMBULATORY_CARE_PROVIDER_SITE_OTHER): Payer: Medicare Other | Admitting: *Deleted

## 2017-01-10 DIAGNOSIS — I4891 Unspecified atrial fibrillation: Secondary | ICD-10-CM

## 2017-01-10 DIAGNOSIS — Z5181 Encounter for therapeutic drug level monitoring: Secondary | ICD-10-CM

## 2017-01-10 DIAGNOSIS — Z7901 Long term (current) use of anticoagulants: Secondary | ICD-10-CM

## 2017-01-10 LAB — POCT INR: INR: 4.8

## 2017-01-22 ENCOUNTER — Ambulatory Visit (INDEPENDENT_AMBULATORY_CARE_PROVIDER_SITE_OTHER): Payer: Medicare Other | Admitting: *Deleted

## 2017-01-22 DIAGNOSIS — Z5181 Encounter for therapeutic drug level monitoring: Secondary | ICD-10-CM

## 2017-01-22 DIAGNOSIS — Z7901 Long term (current) use of anticoagulants: Secondary | ICD-10-CM | POA: Diagnosis not present

## 2017-01-22 DIAGNOSIS — I4891 Unspecified atrial fibrillation: Secondary | ICD-10-CM

## 2017-01-22 LAB — POCT INR: INR: 1.7

## 2017-02-05 ENCOUNTER — Ambulatory Visit (INDEPENDENT_AMBULATORY_CARE_PROVIDER_SITE_OTHER): Payer: Medicare Other | Admitting: *Deleted

## 2017-02-05 DIAGNOSIS — Z5181 Encounter for therapeutic drug level monitoring: Secondary | ICD-10-CM

## 2017-02-05 DIAGNOSIS — Z7901 Long term (current) use of anticoagulants: Secondary | ICD-10-CM

## 2017-02-05 DIAGNOSIS — I4891 Unspecified atrial fibrillation: Secondary | ICD-10-CM

## 2017-02-05 LAB — POCT INR: INR: 3

## 2017-02-08 ENCOUNTER — Telehealth: Payer: Self-pay | Admitting: Pulmonary Disease

## 2017-02-08 MED ORDER — QUINAPRIL HCL 40 MG PO TABS: ORAL_TABLET | ORAL | 1 refills | Status: DC

## 2017-02-08 NOTE — Telephone Encounter (Signed)
Called and spoke to pt. Pt is requesting a refill for her Quinapril. Rx sent to preferred pharmacy. Pt verbalized understanding and denied any further questions or concerns at this time.

## 2017-03-06 ENCOUNTER — Ambulatory Visit (INDEPENDENT_AMBULATORY_CARE_PROVIDER_SITE_OTHER): Payer: Medicare Other | Admitting: *Deleted

## 2017-03-06 DIAGNOSIS — I4891 Unspecified atrial fibrillation: Secondary | ICD-10-CM | POA: Diagnosis not present

## 2017-03-06 DIAGNOSIS — Z5181 Encounter for therapeutic drug level monitoring: Secondary | ICD-10-CM | POA: Diagnosis not present

## 2017-03-06 DIAGNOSIS — Z7901 Long term (current) use of anticoagulants: Secondary | ICD-10-CM | POA: Diagnosis not present

## 2017-03-06 LAB — POCT INR: INR: 1.4

## 2017-03-07 ENCOUNTER — Other Ambulatory Visit: Payer: Self-pay | Admitting: *Deleted

## 2017-03-07 MED ORDER — ALLOPURINOL 300 MG PO TABS: ORAL_TABLET | ORAL | 5 refills | Status: DC

## 2017-03-07 MED ORDER — POTASSIUM CHLORIDE CRYS ER 20 MEQ PO TBCR: 20.0000 meq | EXTENDED_RELEASE_TABLET | Freq: Two times a day (BID) | ORAL | 5 refills | Status: DC

## 2017-03-13 ENCOUNTER — Ambulatory Visit (INDEPENDENT_AMBULATORY_CARE_PROVIDER_SITE_OTHER): Payer: Medicare Other

## 2017-03-13 ENCOUNTER — Encounter: Payer: Self-pay | Admitting: Pulmonary Disease

## 2017-03-13 ENCOUNTER — Ambulatory Visit (INDEPENDENT_AMBULATORY_CARE_PROVIDER_SITE_OTHER): Payer: Medicare Other | Admitting: Pulmonary Disease

## 2017-03-13 VITALS — BP 126/80 | HR 76 | Temp 97.9°F | Ht 63.0 in | Wt 136.4 lb

## 2017-03-13 DIAGNOSIS — I872 Venous insufficiency (chronic) (peripheral): Secondary | ICD-10-CM | POA: Diagnosis not present

## 2017-03-13 DIAGNOSIS — Z853 Personal history of malignant neoplasm of breast: Secondary | ICD-10-CM

## 2017-03-13 DIAGNOSIS — I272 Pulmonary hypertension, unspecified: Secondary | ICD-10-CM | POA: Diagnosis not present

## 2017-03-13 DIAGNOSIS — I10 Essential (primary) hypertension: Secondary | ICD-10-CM | POA: Diagnosis not present

## 2017-03-13 DIAGNOSIS — I481 Persistent atrial fibrillation: Secondary | ICD-10-CM

## 2017-03-13 DIAGNOSIS — R609 Edema, unspecified: Secondary | ICD-10-CM | POA: Diagnosis not present

## 2017-03-13 DIAGNOSIS — R0902 Hypoxemia: Secondary | ICD-10-CM | POA: Diagnosis not present

## 2017-03-13 DIAGNOSIS — R413 Other amnesia: Secondary | ICD-10-CM

## 2017-03-13 DIAGNOSIS — Z23 Encounter for immunization: Secondary | ICD-10-CM

## 2017-03-13 DIAGNOSIS — I4819 Other persistent atrial fibrillation: Secondary | ICD-10-CM

## 2017-03-13 DIAGNOSIS — F411 Generalized anxiety disorder: Secondary | ICD-10-CM

## 2017-03-13 NOTE — Patient Instructions (Signed)
Today we updated your med list in our EPIC system...    Continue your current medications the same...  Today we gave you the 2018 FLU vaccine...  Keep up the good work w/ diet & exercise--    Remember: NO SALT, elevate your legs, wear the support hose...  Call for any questions...  Let's plan a follow up visit in 24mo, sooner if needed for problems.Marland KitchenMarland Kitchen

## 2017-03-13 NOTE — Progress Notes (Signed)
Subjective:    Patient ID: Cheyenne [name], female    DOB: [birth year], 83 y.o.   MRN: [id number]  HPI 83 y/o BF here for a follow up visit... she has multiple medical problems as noted below>> ~  SEE PREV EPIC NOTES FOR OLDER DATA >>    LABS 11/13:  Chems- ok w/ K=3.7 CO2=34 BUN=23 Creat=0.9;  BNP=470...   CXR 5/14 showed cardiomeg, clear lungs, DJD spine, NAD...  LABS 5/14:  Chems- ok x K=3.4;  CBC- ok w/ Hg=12.3;  TSH=1.38;  VitD=31;  BNP=463...  CXR 5/15 showed mild cardiomeg, clear lungs, DJD spine, surg clips right axilla, NAD...  LABS 5/15:  Chems- ok x K=3.4 on K20/d & asked to incr back to Bid;    Ambulate on RA:  96% O2sat on RA at rest w/ HR=52;  87% on RA after 1lap w/ HR=109 => continue same O2 therapy...   LABS 11/15:  BMet- ok w/ K=4.1, HCO3=33, BUN=16, Cr=0.9.Marland Kitchen.  ~  Nov 03, 2014:  33mo ROV & Cheyenne [name] has had a stable interval- CC is itching on her back, no rash, no new meds etc; states her breathing is ok, DOE w/o change, no CP, min cough & Hycodan prn helps; weight is down 5# to 151# & less edema, reminded to elim all salt from her diet... We reviewed the following medical problems during today's office visit >>     Hypoxemia, PulmHTN> she has HomeO2 (not using it all the time), ProairHFA prn, & Hycodan cough syrup; she feels her breathing is at baseline; last 2DEcho 5/13 w/ PAsys~50 & stable...    HBP> on ASA81, Propran80-1/2Bid, Quinapril40, Lasix40-2Qam, Diamox250-2Qpm, & K20Bid; BP= 118/70 & she denies CP, palpit, ch in SOB, & decr edema w/ 5# wt loss.    AFib, SSS, Tachy-brady> on Coumadin (via CC); seen by DrKlein for Cards/EP in the past...    Venous Insuffic & edema> she had incr ankle edema & improved w/ elim sodium, elev legs, support hose & continue same diuretics...    DM> on diet alone; wt is down to 151# today; BS has been normal ~100...    GI- Divertics, IBS, Polyps> on Immodium prn; she saw DrStark 12/13> IBS, hx divertics & colon polyps, he did not rec  surveillance colonoscopy for her...    GU- bladder symptoms> she has freq & urge/stress incont- trial Oxybutynin10mg  & eval by Urology DrManny> rec changing diuretics if poss but it is not poss...    Hx bilat breast cancers> s/p mastectomies in 1996 & 2002, followed by [facility] & doing well w/o known recurrence...    DJD> on Allopurinol 300, Tramadol50; stable w/ mod DJD & limited mobility (knees, hips)...    Early dementia, anxiety> Aware- family helps w/ her meds & helps at home... We reviewed prob list, meds, xrays and labs> see below for updates >> she is due for PREVNAR-13 shot today...  CXR 5/16 showed mild cardiomeg, clear lungs, no effusion, NAD...   LABS 5/16>  Chems- wnl w/ HCO3=34, Cr=0.90;  CBC- wnl w/ Hg=12.1, Fe=65 (22%sat), Ferritin=113;  TSH=2.65...  IMP/PLAN >> Cheyenne [name] is reasonably stable at age 60- given Prevnar-27 today, meds refilled by request, same meds...   ~  May 10, 2015:  17mo ROV & Cheyenne [name] reports doing well- no new complaints or concerns; Epic review shows several calls for refill Hycodan cough syrup (says she uses it for sleep!=> rec to try TylenolPM & Delsym, not the Hydrocodon);  She says her breathing is improved, denies  SOB/ cough/ sput, CP, palpit, etc;  She uses her O2 w/ activity & she exercises w/ Silver Sneakers once per wk & exercise bike at home...    Hypoxemia, PulmHTN> she has HomeO2 (not using it now), ProairHFA prn, & prev Hycodan cough syrup; she feels her breathing is at baseline; last 2DEcho 5/13 w/ PAsys~50 & stable...    HBP> on ASA81, Propran80-1/2Bid, Accupril40, Lasix40-2Qam, Diamox250-2Qpm, & K20Bid; BP= 134/76 & she denies CP, palpit, ch in SOB, & decr edema w/ 5# wt loss.    AFib, SSS, Tachy-brady> on Coumadin (via CC); seen by DrKlein for Cards/EP in the past; denies CP, palpit, change in SOB/edema...    Venous Insuffic & edema> she had incr ankle edema & improved w/ elim sodium, elev legs, support hose & continue same diuretics...    DM>  on diet alone; wt is down to 144# today; BS has been normal ~100...    GI- Divertics, IBS, Polyps> on Immodium prn; she saw DrStark 12/13> IBS, hx divertics & colon polyps, he did not rec surveillance colonoscopy for her...    GU- bladder symptoms> she has freq & urge/stress incont- trial Oxybutynin10mg  & eval by Urology DrManny> rec changing diuretics if poss but it is not poss...    Hx bilat breast cancers> s/p mastectomies in 1996 & 2002, followed by [facility] & doing well w/o known recurrence...    DJD> on Allopurinol 300, Tramadol50, VitD2000; stable w/ mod DJD & limited mobility (knees, hips); she saw DrZSmith for neck pain, DDD, musc spasm, decr ROM- given exercises, ice/heat, Pennsaid, Tylenol, VitD rx.    Early dementia, anxiety> Aware- family helps w/ her meds & helps at home... EXAM shows Afeb, VSS, O2sat=96% on RA at rest;  Heent- neg, mallampati2;  Chest- clear w/o w/r/r; Heart- RR gr1/6 w/o r/g; Abd- soft, nontender, neg; Ext- VI, trace edema; Neuro- memory prob, no focal abn... IMP/PLAN>>  Cheyenne [name] is stable, same meds, OK 2016 Flu vaccine today, we plan ROV 50mo...   ~  September 02, 2015:  61mo ROV & add-on appt requested post ER visit> Cheyenne [name] went to the ER 08/29/15 w/ a "bump" on her vagina- it had been many yrs since she saw a Gyn, note made of her AFib hx and coumadin clinic rx; they noted bruising in her lower abd wall towards the left side & in her vaginal area- initially w/ some mild discomfort but no severe pain or bleeding; she denied any injury but later mentioned her exercise bike w/ uncomfortable seat that comes to a point near her pubis (she rides for 1H nightly while watching [employer]!);  She was evaluated by DrZammit including EXAM that showed neg abd exam x ecchymosis over lower left abd wall and mons pubis, +hematoma described in left labial fold, remainder of exam neg;  LABS showed Hg=11.5, Protime=23.9/ INR=2.15, CT Abd&Pelvis showed 5.2 x 2.7 cm intermediate soft tissue  density in the left perineum deep to the labia suggesting a hematoma;  Coumadin was held & she was asked to follow up w/ Gyn regarding the hematoma & here regarding the coumadin rx...    She saw Dr. Linda Hedges w/ vulvar hematoma resolving on conservative management...    She has held the Coumadin since 3/20, not riding the bike since then either, and she notes that the bruising is slowly diminishing, the "knot" seems to be getting smaller, bruising about the same- she denies any pain at present;  Plan is to continue off Coumadin until her Gyn  check on 09/05/15, and as long as the deep hematoma appears to be resolving then we will resume her anticoag on a sl lower dose to start... EXAM shows Afeb, VSS, O2sat=97% on 2L/min pulse dose;  HEENT- neg, mallampati2;  Chest- clear w/o w/r/r;  Heart- irreg Afib, rate60, gr1/6 SEM w/o r/g;  Abd- soft nontender, bruising in LLQ;  Ext= VI, trace edema;  Neuro- nonfocal... IMP/PLAN>>  Perineum hematoma likely from exercise bike seat trauma + her Coumadin therapy for AFib;  She appears stable off the Coumadin for the past 4d & notes that there is no pain & the "knot" appears to be getting smaller;  Plan is to remain off Coumadin until her Gyn check on 3/27 & if appears satis then we will rec resuming her anticoag rx at that time...   ~  Nov 09, 2015:  63mo ROV & pulmonary/medical recheck>  Her pelvic hematoma has resolved and she is back on Coumadin- followed closely in the CC, labs reviewed;  She has noted some swelling in feet & ankles, incr DOE, some orthopnea;  She is on Lasix40-2Qam + KCl Bid but she is NOT restricting dietary sodium...     EXAM shows Afeb, VSS, O2sat=92% on 2L/min pulse dose;  HEENT- neg, mallampati2;  Chest- clear w/o w/r/r;  Heart- irreg Afib, rate60, gr1/6 SEM w/o r/g;  Abd- soft nontender, bruising in LLQ;  Ext= VI, 2+ edema... IMP/PLAN>>  Cheyenne [name] is generally stable but has incr edema & needs NO SALT diet, continue Lasix80Qam;  She wants to  restart exercise bike & has new seat- so- OK... we plan ROV in 54mo w/ labs etc...  ~  March 12, 2016:  81mo ROV & Zuriah reports doing well- no new complaints or concerns;  Her breathing is good, uses her port O2 just as needed, notes swelling is diminished  & she's using the support hose    Hypoxemia, PulmHTN> she has HomeO2 (using it prn), ProairHFA prn, & prev Hycodan cough syrup; she feels her breathing is at baseline; last 2DEcho 5/13 w/ PAsys~50 & stable...    HBP> on ASA81, Propran80-1/2Bid, Accupril40, Lasix40-2Qam, Diamox250-2Qpm, & K20Bid; BP= 124/78 & she denies CP, palpit, ch in SOB, & decr edema w/ 5# wt loss; K~3.5-4.1, BWI2~03-55    AFib, SSS, Tachy-brady> on Coumadin (via CC); seen by DrKlein for Cards/EP in the past; denies CP, palpit, change in SOB/edema...    Venous Insuffic & edema> she had incr ankle edema & improved w/ elim sodium, elev legs, support hose & continue same diuretics...    DM> on diet alone; wt is down to 144# today; BS has been normal ~100...    GI- Divertics, IBS, Polyps> on Immodium prn; she saw DrStark 12/13> IBS, hx divertics & colon polyps, he did not rec surveillance colonoscopy for her...    GU- bladder symptoms> she has freq & urge/stress incont- trial Oxybutynin10mg  & eval by Urology DrManny> rec changing diuretics if poss but it is not poss...    Hx bilat breast cancers> s/p mastectomies in 1996 & 2002, followed by [facility] & doing well w/o known recurrence...    DJD> on Allopurinol 300, Tramadol50, VitD2000; stable w/ mod DJD & limited mobility (knees, hips); she saw DrZSmith for neck pain, DDD, musc spasm, decr ROM- given exercises, ice/heat, Pennsaid, Tylenol, VitD rx.    Early dementia, anxiety> Aware- family helps w/ her meds & helps at home... EXAM shows Afeb, VSS, O2sat=96% on RA at rest;  Heent- neg, mallampati2;  Chest-  clear w/o w/r/r; Heart- RR gr1/6 w/o r/g; Abd- soft, nontender, neg; Ext- VI, 1+edema; Neuro- memory prob, no focal  abn... IMP/PLAN>>  Cheyenne [name] is stable, continue same meds, OK Flu shot today...  ~  September 10, 2016:  71mo ROV & Cheyenne [name] reports a quiet interval- breathing well, no new complaints or concerns;  She has portable O2 that she uses w/ activity & exertion but takes it off at rest w/ O2sats in the 90s;  She denies CP, palpit, dizzy, etc but she has VI & +pitting edema despite low sodium diet, support hose, elevation and Demadex/ Diamox diuretic regimen... Problem list reviewed, doing satis & no active issues...     EXAM shows Afeb, VSS, O2sat=99% on RA at rest;  Heent- neg, mallampati2;  Chest- clear w/o w/r/r; Heart- RR gr1/6 w/o r/g; Abd- soft, nontender, neg; Ext- VI, 1+edema; Neuro- memory prob, no focal abn...  LABS 09/10/16>  Chems- ok w/ K=3.9, HCO3=33, BS=95, Cr=0.98;  CBC- wnl w/ Hg=12.2;  TSH=2.79;  BNP=393 IMP/PLAN>>  Cheyenne [name] is remarkably stable given her medical issues;  Rec to continue same meds & call for any problems...  ~  December 18, 2016:  70mo ROV & add-on appt requested for bruising on left hand>  Pt tells me that about 1wk ago she noticed a knot on the dorsum of her left hand, then developed a bruise that extended down her fingers & covered the back of her hand; she denies known trauma but the knot sounds like a small hematoma=> ecchmosis;  She denies pain, warmth, drainage, etc;  Recall hx vulvar hematoma 08/2015 due to trauma from exercise bike seat, it resolved w/ conservative measures;  She is on Coumadin for AFib & her last 3 protimes showed INR 2.2-2.3, but late last week it was 3.5 & dose decreased by the clinic staff; pt/ daugh report otherw she is stable- doing satis...    EXAM shows Afeb, VSS, O2sat=96% on RA at rest;  Wt decr to 134#;  Heent- neg, mallampati2;  Chest- clear w/o w/r/r; Heart- RR gr1/6 w/o r/g; Abd- soft, nontender, neg; Ext- VI, 1+edema; Neuro- memory prob, no focal abn...  LAB 12/13/16>  Coumadin clinic PT/INR=3.5 and her dose was decr to 1x5d=MWThFSu, and 1.5tabs  x2d=TSa... IMP/PLAN>>  She has had another mild bleeding episode- this one an ecchymosis on her left hand, no known trauma; she is asymptomatic- discussed cool compresses etc if needed; Coumadin clinic will watch her carefully & I have asked her to maintain her regular f/u appts...    ~  March 13, 2017:  46mo ROV & Cheyenne [name] is stable "feeling OK" she says but c/o back itching and occas dizziness;  No further bleeding episodes- followed carefully by CC;  She notes that memory issues frustrate her & her daughter "I just keep losing stuff" (they decline offer for Aricept medication)... We reviewed the following medical problems during today's office visit >>     Hypoxemia, PulmHTN> she has HomeO2 (using it prn), ProairHFA prn, & prev Hycodan cough syrup; she feels her breathing is at baseline- denies cough, sputum, CP, etc; last 2DEcho 5/13 w/ PAsys~49 & stable...    HBP> on ASA81, Propran80-1/2Bid, Accupril40, Lasix40-2Qam, Diamox250-2Qpm, & K20Bid; BP= 126/80 & she denies CP, palpit, ch in SOB, & decr edema; K~3.9-4.7, WVP7~10-62    AFib, SSS, Tachy-brady> on Coumadin (via CC); seen by DrKlein for Cards/EP in the past; denies CP, palpit, change in SOB/edema; 2DEcho 5/13 showed norm LVF w/ EF=55%, mild MR&AI.Marland KitchenMarland Kitchen  Venous Insuffic & edema> she had incr ankle edema & improved w/ elim sodium, elev legs, support hose & continue same diuretics...    DM> on diet alone; wt is down to 136# today; BS has been normal ~100...    GI- Divertics, IBS, Polyps> on Imodium prn; she saw DrStark 12/13> IBS, hx divertics & colon polyps, he did not rec surveillance colonoscopy for her...    GU- bladder symptoms> she has freq & urge/stress incont- trial Oxybutynin10mg  & eval by Urology DrManny> rec changing diuretics if poss but it is not poss...    Hx bilat breast cancers> s/p mastectomies in 1996 & 2002, followed by [facility] & doing well w/o known recurrence...    DJD> on Allopurinol 300, Tramadol50, VitD2000; stable w/ mod  DJD & limited mobility (knees, hips); she saw DrZSmith for neck pain, DDD, musc spasm, decr ROM- given exercises, ice/heat, Pennsaid, Tylenol, VitD rx.    Early dementia, anxiety> Aware- family helps w/ her meds & helps at home... EXAM shows Afeb, VSS, O2sat=100% on 3L/min pulse-dose at rest;  Heent- neg, mallampati2;  Chest- clear w/o w/r/r; Heart- RR gr1/6 w/o r/g; Abd- soft, nontender, neg; Ext- VI, 1+edema; Neuro- memory prob, no focal abn... IMP/PLAN>>  Cheyenne [name] is stable- no further bleeding & CC is watching protimes closely;  OK FLU shot today;  She declines to start Aricept etc;  rec to continue same meds...            Problem List:  ALLERGIC RHINITIS (ICD-477.9) - on FLONASE Qhs & ZYRTEK QAM Prn.  Hx of HYPOXEMIA (ICD-799.02) - full eval for hypoxemia in [place] by 2DEcho ~  7/08:  CXR= cardiomegaly, ectatic Ao, pulm ven HTN... CTAngio= neg, without PE, min scarring in lingula... 2DEcho= norm LVF w/ EF=55%, LVwall thickness at upper limits, mild ca++ AoV w/ mild AI,  PA sys pressure = 67mmHg... O2 sats 90-94 at rest on RA... w/ ambulation in the office sats drop to 85%... she didn't yet want to go on oxygen at home... just occas SOB w/ activity- she is too sedentary and needs to incr her exercise program. ~  CTChest 6/09 showed bilat mastectomies, no adenopathy, no fluid, 2 sm nodules 4-21mm size RML/LLL, NAD... ~  6/10: c/o increased DOE & no energy- O2 sat= 91% rest & 86% w/activ... rec> home O2 1L/min rest, 2L/min exercise. ~  4/11 hosp> V/Q scan w/ normal vent & norm perfusion... CXR 4/11 showed cardiomeg, clear lungs, no CHF, surg clips in right axilla... ~  8/11:  pt requesting change to LiqO2 to incr mobility. ~  11/12:  O2 sat= 94% at rest on 2L/min oxygen via [place]... remains stable. ~  11/13:  O2 sat= 100% on 2L/min at rest... ~  CXR 5/14 showed cardiomeg, clear lungs, DJD spine, NAD ~  82/99:  O2 recert today> O2 sat on RA at rest=96% w/ HR=65/min;   Ambulated 3 laps on RA w/ nadir O2 sat=88% w/ HR=116; Rec to continue O2 w/ exercise & Qhs... ~  CXR 5/15 showed mild cardiomeg, clear lungs, DJD spine, surg clips right axilla, NAD.Marland Kitchen. ~  5/15: Ambulate on RA:  96% O2sat on RA at rest w/ HR=52;  87% ob RA after 1lap w/ HR=109 => continue same O2 therapy ~  11/15: she remains on O2 at 2-3L/min flow; O2sats on 3L/min today = 98%... ~  5/16: she has port O2 at home but states not using it now; O2sat=94% on RA at rest today...  ~  11/16: she remains on O2 at 2L/min pulse dose w/ exercise...  HYPERTENSION (ICD-401.9) - controlled on PROPRANOLOL 80mg Bid, QUINAPRIL 40mg /d; and back on LASIX 40mg AM, KCL 44mEqAM, & ACETAZOLAMIDE 250mg - 2tabsPM... she knows to avoid sodium etc... ~  labs 2/09 showed Na143, K3.6, CL101, CO2 36, BUN 15, Cr0.9.Marland KitchenMarland Kitchen BNP was 194... ~  labs 6/09 showed lytes normal x TCO2= 37, & BNP= 512... ~  labs 12/09 showed HCO3- 36,  BNP= 586... rec- same meds, no salt! ~  labs 6/10 showed Na= 146, K= 3.6, CO2= 36, BUN= 14, Creat= 0.9, BNP= 336. ~  labs 9/10 showed Na-147, K=3.7, HCO3=36, BUN=18, Creat=0.9, BNP=484 ~  labs 1/11 showed Na=144, K=3.9, HCO3=35, BUN=17, Creat=0.9, BNP=488 ~  [facility] 4/11 & meds changed by TH> off diuretics & KCl... CXR showed cardiomeg, clear lungs, clips right axilla. ~  labs 10/15/09 showed Na=143, K=4.0, HCO3=37, BUN=12, Creat=0.8, BNP=761... restart Lasix, Diamox, KCl (one each). ~  labs 10/25/09 showed HCO3=44, BNP=588... rec> incr Diamox to 2Qpm... ~  labs 8/11 showed HCO3=39, BNP=481... continue same. ~  Labs 5/12 showed HCO3=35, BNP=373... Improved, continue same Rx. ~  Labs 11/12 showed HCO3=37, BNP=351... Stable continue same meds. ~  5/13: BP= 138/88 & HCO3=39, BUN=17, Creat=0.9, BNP=534... Reminded to take meds daily. ~  11/3: on ASA81, Propran80Bid, Quinapril40, Lasix40, Diamox250-2/d, & K20; BP= 128/82 & she denies CP, palpit, ch in SOB or edema...  ~  5/14: on ASA81, Propran80Bid, Quinapril40,  Lasix40, Diamox250-2/d, & K20Bid; BP= 136/80 & she denies CP, palpit, ch in SOB or edema ~  11/14: on ASA81, Propran80-1/2Bid, Quinapril40, Lasix40, Diamox250-2/d, & K20Bid; BP= 142/80 & she remains essentially asymptomatic... ~  5/15: on ASA81, Propran80-1/2Bid, Quinapril40, Lasix40, Diamox250-2/d, & K20Bid; BP= 144/80 & she denies CP, palpit, ch in SOB, but has developed ankle edema & 5# wt gain ~  11/15: on ASA81, Propran80-1/2Bid, Quinapril40, Lasix40, Diamox250-2/d, & K20Bid; BP= 120/80 & she remains minimally symptomatic but minimally active as well... ~  5/16: on ASA81, Propran80-1/2Bid, Quinapril40, Lasix40-2Qam, Diamox250-2Qpm, & K20Bid; BP= 118/70 & she denies CP, palpit, ch in SOB, & decr edema w/ 5# wt loss.  ATRIAL FIBRILLATION (ICD-427.31) & SICK SINUS/ TACHY-BRADY SYNDROME (ICD-427.81) - new prob 1/10 w/ eval by DrKlein- rate control strategy on Propranolol w/ Coumadin [facility]. ~  7/08:  2DEcho= norm LVF w/ EF=55%, LVwall thickness at upper limits, mild ca++ AoV w/ mild AI,  PA sys pressure = 68mmHg... ~  EKG 1/10 showed AFib, rate~ 60, NSSTTWA... ~  2DEcho 3/10 showed norm LVF w/ EF= 60% & no regional wall motion abn, mild calcif AoV w/ mild AI, mild LA & RA dil w/ incr thickness of interatrial septum c/w lipomatous hypertrophy, PA sys est at 50... ~  Holter monitor 3/10 = AFib w/ rate 45-97... ~  2DEcho 4/11 in hosp showed norm LVwall thickness & EF=55%, mild RVdil w/ mod decr RV function, mod TR & PAsys~69... ~  2DEcho 5/13 showed normal LVF w/ EF=55%, mild AI & MR, mild LA & RA dilatation, PAsys=49... Stable. ~  Stable on Coumadin & followed by DrKlein w/ rate control strategy... ~  08/2015> Cheyenne [name] had a vulvar hematoma caused by trauma from an exercise bike seat in conjunction w/ her coumadin=> it was held tempoarily & later restarted... ~  12/2016> She developed an ecchymosis on dorsum of left hand, no known trauma but INR was 3.5;  Coumadin adjusted by  CC...  PULMONARY HYPERTENSION >> Serial 2DEchos showed +- stable PA sys estimates (see  above). ~  V/Q lung scan 4/11 was neg for PE... ~  2DEcho 5/13 showed normal LVF w/ EF=55%, mild AI & MR, mild LA & RA dilatation, PAsys=49... Stable. ~  She is way too sedentary but not motivated to exercise etc... ~  Subsequently started exercising on a stationary bike at home...  VENOUS INSUFFICIENCY (ICD-459.81) - mild persist edema despite the sodium restriction, Lasix and Diamox... reviewed recommendation for No Salt, Elevation, TED's, etc... ~  11/15: she presented w/ 10# further wt gain and 4+edema in feet; BMet was OK & we decided to incr Lasix40 to 2tabsQam, continue other meds 7 recheck labs in 6wks... ~  6/15: she had incr ankle edema & improved w/ elim sodium, elev legs, support hose & continue same diuretics  DIABETES MELLITUS, BORDERLINE (ICD-790.29) - prev on Metformin 500mg /d (held after 4/11 hosp)... ~  last BS=181, HgA1c=6.4 in 11/08... ~  labs 6/09 showed BS= 101, HgA1c= 6.4 ~  labs 12/09 showed BS= 139,  HgA1c= 6.1 ~  labs 6/10 showed BS= 123, A1c= 6.1 ~  7/10: neg ophthal f/u DrHecker- no retinopathy, no macular edema. ~  labs 9/10 showed BS= 93 ~  labs 1/11 showed BS=146, A1c=5.7 ~  labs 5/11 showed BS= 100 on diet alone... ~  labs 8/11 showed BS= 96 ~  Labs 5/12 showed BS= 113, A1c= 5.8 ~  9/12:  Ophthalmology check by DrHecker- no DM retinopathy... ~  Labs 11/12 showed BS= 104 ~  Labs 5/13 showed BS= 118 ~  Labs 11/13 showed BS= 97 ~  2/14: she had a neg Ophthalmology eval by DrHecker... ~  Labs 5/14 showed BS= 102 on diet alone... ~  On diet alone, BS ~100 and stable...  DIVERTICULOSIS OF COLON, IRRITABLE BOWEL SYNDROME & COLONIC POLYPS (ICD-211.3) ~  colonoscopy was 11/06 by DrStark showing divertics and several polyps (one 84mm adenoma)... f/u 36yrs. ~  colonoscopy 1/10 by DrStark showed divertics (severe in sigmoid), 91mm polyp= tub adenoma... f/u 3 yrs. ~  12/13:  She  had GI f/u DrStark> IBS w/ freq loose stools, occas BRB, hx extensive divertics & adenomatous polyps, on coumadin for AFib;   URINARY FREQ&URGENCY >>  ~  11/13: she had a GU eval by DrManny> c/o freq & urgency assoc w/ Lasix, some leakage & managing w/ one pad/d, some better w/ Oxybutynin... ~  5/14: she had f/u DrManny, Urology> urinary freq & urgency; worse w/ Lasix; mild leakage & uses 1 pad/d; Oxybutynin caused dry mouth; situation is acceptable & no further meds rec...  BILAT BREAST CANCERS - resected via mastectomies in 1996 and 2002... followed by [facility] annually & seen 8/12> note reviewed. ~  1/15: she had her yearly f/u by [facility]- his note is reviewed, stable, wound over sternum is resolved...   DEGENERATIVE JOINT DISEASE (ICD-715.90) - w/ hx hyperuricemia on ALLOPURINOL 300mg /d & has TRAMADOL 50mg  prn pain...  Hx of HEADACHE (ICD-784.0) ~  MRI Brain 4/11 showed atrophy & sm vessel dis, degen changes at C1-2 w/ some sp stenosis caused part by ant slip of the C1 ring, incidental part empty sella...  ANXIETY (ICD-300.00) - prev rec to take Alpraz 0,5mg  Prn but she never filled the Rx. ~  Remeron 7.5mg /d started 4/11 hosp & stopped 5/11 due to lethargy, sleepiness...   Past Surgical History:  Procedure Laterality Date  . BREAST SURGERY    . CATARACT EXTRACTION    . CHOLECYSTECTOMY    . DOUBLE MASTECTOMY    . KNEE SURGERY  2004   RIGHT  . MASTECTOMY  1996   right breast for breast cancer  . MASTECTOMY  1/02   left - Dr Lucia Gaskins  . TOTAL KNEE ARTHROPLASTY  6/04   right - Dr Telford Nab    Outpatient Encounter Prescriptions as of 03/13/2017  Medication Sig  . acetaZOLAMIDE (DIAMOX) 250 MG tablet TAKE 2 TABLETS BY MOUTH DAILY AT 4PM  . allopurinol (ZYLOPRIM) 300 MG tablet TAKE 1 TABLET (300 MG TOTAL) BY MOUTH DAILY.  Marland Kitchen aspirin 81 MG tablet Take 81 mg by mouth daily.    . Cholecalciferol (VITAMIN D3) 5000 units CAPS Take 1 capsule by mouth daily.  . furosemide (LASIX) 40 MG  tablet TAKE 2 TABLET EVERY MORNING  . loperamide (IMODIUM) 2 MG capsule Take 2 mg by mouth as needed for diarrhea or loose stools.   . Multiple Vitamin (MULTIVITAMIN) capsule Take 1 capsule by mouth daily.    . Multiple Vitamins-Minerals (PRESERVISION AREDS PO) Take 1 tablet by mouth daily.  Marland Kitchen oxybutynin (DITROPAN-XL) 10 MG 24 hr tablet TAKE 1 TABLET EVERY DAY FOR BLADDER SPASMS  . potassium chloride SA (KLOR-CON M20) 20 MEQ tablet Take 1 tablet (20 mEq total) by mouth 2 (two) times daily.  . propranolol (INDERAL) 80 MG tablet TAKE 1/2 TABLET BY MOUTH TWO TIMES DAILY  .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

## 2017-03-15 ENCOUNTER — Ambulatory Visit (INDEPENDENT_AMBULATORY_CARE_PROVIDER_SITE_OTHER): Payer: Medicare Other | Admitting: *Deleted

## 2017-03-15 DIAGNOSIS — Z5181 Encounter for therapeutic drug level monitoring: Secondary | ICD-10-CM | POA: Diagnosis not present

## 2017-03-15 DIAGNOSIS — I4891 Unspecified atrial fibrillation: Secondary | ICD-10-CM | POA: Diagnosis not present

## 2017-03-15 DIAGNOSIS — Z7901 Long term (current) use of anticoagulants: Secondary | ICD-10-CM | POA: Diagnosis not present

## 2017-03-15 LAB — POCT INR: INR: 2.9

## 2017-03-26 ENCOUNTER — Ambulatory Visit (INDEPENDENT_AMBULATORY_CARE_PROVIDER_SITE_OTHER): Payer: Medicare Other | Admitting: *Deleted

## 2017-03-26 DIAGNOSIS — Z5181 Encounter for therapeutic drug level monitoring: Secondary | ICD-10-CM

## 2017-03-26 DIAGNOSIS — I4891 Unspecified atrial fibrillation: Secondary | ICD-10-CM | POA: Diagnosis not present

## 2017-03-26 DIAGNOSIS — Z7901 Long term (current) use of anticoagulants: Secondary | ICD-10-CM

## 2017-03-26 LAB — POCT INR: INR: 2.7

## 2017-04-08 ENCOUNTER — Encounter: Payer: Self-pay | Admitting: Internal Medicine

## 2017-04-08 ENCOUNTER — Ambulatory Visit (INDEPENDENT_AMBULATORY_CARE_PROVIDER_SITE_OTHER): Payer: Medicare Other | Admitting: Internal Medicine

## 2017-04-08 ENCOUNTER — Ambulatory Visit (INDEPENDENT_AMBULATORY_CARE_PROVIDER_SITE_OTHER): Payer: Medicare Other | Admitting: *Deleted

## 2017-04-08 ENCOUNTER — Ambulatory Visit: Payer: Medicare Other | Admitting: Internal Medicine

## 2017-04-08 ENCOUNTER — Encounter (INDEPENDENT_AMBULATORY_CARE_PROVIDER_SITE_OTHER): Payer: Self-pay

## 2017-04-08 VITALS — BP 136/76 | HR 73 | Ht 63.0 in | Wt 137.0 lb

## 2017-04-08 DIAGNOSIS — I481 Persistent atrial fibrillation: Secondary | ICD-10-CM | POA: Diagnosis not present

## 2017-04-08 DIAGNOSIS — I4891 Unspecified atrial fibrillation: Secondary | ICD-10-CM

## 2017-04-08 DIAGNOSIS — I4819 Other persistent atrial fibrillation: Secondary | ICD-10-CM

## 2017-04-08 DIAGNOSIS — Z7901 Long term (current) use of anticoagulants: Secondary | ICD-10-CM | POA: Diagnosis not present

## 2017-04-08 DIAGNOSIS — Z5181 Encounter for therapeutic drug level monitoring: Secondary | ICD-10-CM

## 2017-04-08 DIAGNOSIS — I10 Essential (primary) hypertension: Secondary | ICD-10-CM | POA: Diagnosis not present

## 2017-04-08 LAB — POCT INR: INR: 3.4

## 2017-04-08 NOTE — Progress Notes (Signed)
Cheyenne Space, MD: Primary Cardiologist:  Previously Cheyenne [name] (has not seen since 2010)  Cheyenne [name] is a 83 y.o. female with a h/o permanent afib and prior sinus bradycardia who presents today to establish care in the Electrophysiology device clinic.  She saw Cheyenne [name] in 2010 but not since.  She is doing reasonably well for her age.  She has venous insufficiency with chronic edema.  + R foot pain for several days.  She continues to live alone and is reasonably active for her age.  Today, she  denies symptoms of palpitations, chest pain, shortness of breath, orthopnea, PND, dizziness, presyncope, syncope, or neurologic sequela.  The patientis tolerating medications without difficulties and is otherwise without complaint today.   Past Medical History:  Diagnosis Date  . Adenocarcinoma, breast ([place])    bilateral  . Allergic rhinitis   . Anxiety   . Atrial fibrillation ([name])   . Borderline diabetes mellitus   . Diverticulosis   . DJD (degenerative joint disease)   . Headache(784.0)   . HTN (hypertension)   . Hypoxemia   . IBS (irritable bowel syndrome)   . Sick sinus syndrome with tachycardia ([place])   . Tubulovillous adenoma of colon 2006  . Venous insufficiency    Past Surgical History:  Procedure Laterality Date  . BREAST SURGERY    . CATARACT EXTRACTION    . CHOLECYSTECTOMY    . DOUBLE MASTECTOMY    . KNEE SURGERY  2004   RIGHT  . MASTECTOMY  1996   right breast for breast cancer  . MASTECTOMY  1/02   left - Cheyenne Lucia Gaskins  . TOTAL KNEE ARTHROPLASTY  6/04   right - Cheyenne Telford Nab    Social History   Social History  . Marital status: Widowed    Spouse name: N/A  . Number of children: 3  . Years of education: N/A   Occupational History  . retired Pharmacist, hospital    Social History Main Topics  . Smoking status: Never Smoker  . Smokeless tobacco: Never Used  . Alcohol use No  . Drug use: No  . Sexual activity: Not on file   Other Topics Concern  . Not on file    Social History Narrative  . No narrative on file    Family History  Problem Relation Age of Onset  . Kidney cancer Sister   . Cancer Sister        breast  . Heart attack Mother   . Ulcers Father   . Heart disease Brother        CABG  . Asthma Brother   . Breast cancer Daughter   . Cancer Daughter        breast    Allergies  Allergen Reactions  . Iohexol      Desc: IV History sheet from prior CT states IV Contrast allergy- 13 hour prep given.     Current Outpatient Prescriptions  Medication Sig Dispense Refill  . acetaZOLAMIDE (DIAMOX) 250 MG tablet TAKE 2 TABLETS BY MOUTH DAILY AT 4PM 60 tablet 6  . albuterol (PROVENTIL HFA;VENTOLIN HFA) 108 (90 BASE) MCG/ACT inhaler Inhale 1-2 puffs into the lungs every 6 (six) hours as needed for wheezing or shortness of breath.    . allopurinol (ZYLOPRIM) 300 MG tablet TAKE 1 TABLET (300 MG TOTAL) BY MOUTH DAILY. 30 tablet 5  . aspirin 81 MG tablet Take 81 mg by mouth daily.      . Cholecalciferol (VITAMIN D3)  5000 units CAPS Take 1 capsule by mouth daily.    . furosemide (LASIX) 40 MG tablet TAKE 2 TABLET EVERY MORNING 180 tablet 3  . loperamide (IMODIUM) 2 MG capsule Take 2 mg by mouth as needed for diarrhea or loose stools.     . Multiple Vitamin (MULTIVITAMIN) capsule Take 1 capsule by mouth daily.      . Multiple Vitamins-Minerals (PRESERVISION AREDS PO) Take 1 tablet by mouth daily.    Marland Kitchen oxybutynin (DITROPAN-XL) 10 MG 24 hr tablet TAKE 1 TABLET EVERY DAY FOR BLADDER SPASMS 90 tablet 3  . potassium chloride SA (KLOR-CON M20) 20 MEQ tablet Take 1 tablet (20 mEq total) by mouth 2 (two) times daily. 60 tablet 5  . propranolol (INDERAL) 80 MG tablet TAKE 1/2 TABLET BY MOUTH TWO TIMES DAILY 90 tablet 2  . quinapril (ACCUPRIL) 40 MG tablet TAKE 1 TABLET (40 MG TOTAL) BY MOUTH DAILY. 90 tablet 1  . warfarin (COUMADIN) 5 MG tablet TAKE AS DIRECTED BY COUMADIN CLINIC 40 tablet 3   No current facility-administered medications for this  visit.     ROS- all systems are reviewed and negative except as per HPI  Physical Exam: Vitals:   04/08/17 1449  BP: 136/76  Pulse: 73  SpO2: 97%  Weight: 137 lb (62.1 kg)  Height: 5\' 3"  (1.6 m)    GEN- The patient is elderly appearing, alert and oriented x 3 today.   Head- normocephalic, atraumatic Eyes-  Sclera clear, conjunctiva pink Ears- hearing intact Oropharynx- clear Neck- supple,  Lungs- Clear to ausculation bilaterally, normal work of breathing Heart- irregular rate and rhythm  GI- soft, NT, ND, + BS Extremities- no clubbing, cyanosis, +2 edema, R foot tender across dorsum but with 2+DP/PT pulses MS- diffuse muscle atrophy Skin- no rash or lesion Psych- euthymic mood, full affect Neuro- strength and sensation are intact   ekg today reveals afib, V rate 73 bpm, nonspecific ST/T changes  Assessment and Plan:  1. Permanent atrial fibrillation Well rate controlled chads2vasc score is at least 5.  Continue coumadin Stop asa to avoid increased bleeding risks  2. HTN Stable No change required today  3. Venous insufficiency 2 gram sodium diet Support hose advised  Return every year to see EP PA  Thompson Grayer MD, [facility] 04/08/2017 3:16 PM

## 2017-04-08 NOTE — Patient Instructions (Signed)
Medication Instructions:  Your physician has recommended you make the following change in your medication:  1)  Stop Aspirin    Procedures: None ordered   Follow-Up: Your physician wants you to follow-up in: 12 months with Tommye Standard, PA   Any Other Special Instructions Will Be Listed Below (If Applicable).   Low-Sodium Eating Plan Sodium, which is an element that makes up salt, helps you maintain a healthy balance of fluids in your body. Too much sodium can increase your blood pressure and cause fluid and waste to be held in your body. Your health care provider or dietitian may recommend following this plan if you have high blood pressure (hypertension), kidney disease, liver disease, or heart failure. Eating less sodium can help lower your blood pressure, reduce swelling, and protect your heart, liver, and kidneys. What are tips for following this plan? General guidelines  Most people on this plan should limit their sodium intake to 2,000 mg (milligrams) of sodium each day. Reading food labels  The Nutrition Facts label lists the amount of sodium in one serving of the food. If you eat more than one serving, you must multiply the listed amount of sodium by the number of servings.  Choose foods with less than 140 mg of sodium per serving.  Avoid foods with 300 mg of sodium or more per serving. Shopping  Look for lower-sodium products, often labeled as "low-sodium" or "no salt added."  Always check the sodium content even if foods are labeled as "unsalted" or "no salt added".  Buy fresh foods. ? Avoid canned foods and premade or frozen meals. ? Avoid canned, cured, or processed meats  Buy breads that have less than 80 mg of sodium per slice. Cooking  Eat more home-cooked food and less restaurant, buffet, and fast food.  Avoid adding salt when cooking. Use salt-free seasonings or herbs instead of table salt or sea salt. Check with your health care provider or pharmacist  before using salt substitutes.  Cook with plant-based oils, such as canola, sunflower, or olive oil. Meal planning  When eating at a restaurant, ask that your food be prepared with less salt or no salt, if possible.  Avoid foods that contain MSG (monosodium glutamate). MSG is sometimes added to [place] food, bouillon, and some canned foods. What foods are recommended? The items listed may not be a complete list. Talk with your dietitian about what dietary choices are best for you. Grains Low-sodium cereals, including oats, puffed wheat and rice, and shredded wheat. Low-sodium crackers. Unsalted rice. Unsalted pasta. Low-sodium bread. Whole-grain breads and whole-grain pasta. Vegetables Fresh or frozen vegetables. "No salt added" canned vegetables. "No salt added" tomato sauce and paste. Low-sodium or reduced-sodium tomato and vegetable juice. Fruits Fresh, frozen, or canned fruit. Fruit juice. Meats and other protein foods Fresh or frozen (no salt added) meat, poultry, seafood, and fish. Low-sodium canned tuna and salmon. Unsalted nuts. Dried peas, beans, and lentils without added salt. Unsalted canned beans. Eggs. Unsalted nut butters. Dairy Milk. Soy milk. Cheese that is naturally low in sodium, such as ricotta cheese, fresh mozzarella, or Swiss cheese Low-sodium or reduced-sodium cheese. Cream cheese. Yogurt. Fats and oils Unsalted butter. Unsalted margarine with no trans fat. Vegetable oils such as canola or olive oils. Seasonings and other foods Fresh and dried herbs and spices. Salt-free seasonings. Low-sodium mustard and ketchup. Sodium-free salad dressing. Sodium-free light mayonnaise. Fresh or refrigerated horseradish. Lemon juice. Vinegar. Homemade, reduced-sodium, or low-sodium soups. Unsalted popcorn and pretzels. Low-salt or  salt-free chips. What foods are not recommended? The items listed may not be a complete list. Talk with your dietitian about what dietary choices are best  for you. Grains Instant hot cereals. Bread stuffing, pancake, and biscuit mixes. Croutons. Seasoned rice or pasta mixes. Noodle soup cups. Boxed or frozen macaroni and cheese. Regular salted crackers. Self-rising flour. Vegetables Sauerkraut, pickled vegetables, and relishes. Olives. [place] fries. Onion rings. Regular canned vegetables (not low-sodium or reduced-sodium). Regular canned tomato sauce and paste (not low-sodium or reduced-sodium). Regular tomato and vegetable juice (not low-sodium or reduced-sodium). Frozen vegetables in sauces. Meats and other protein foods Meat or fish that is salted, canned, smoked, spiced, or pickled. Bacon, ham, sausage, hotdogs, corned beef, chipped beef, packaged lunch meats, salt pork, jerky, pickled herring, anchovies, regular canned tuna, sardines, salted nuts. Dairy Processed cheese and cheese spreads. Cheese curds. Blue cheese. Feta cheese. String cheese. Regular cottage cheese. Buttermilk. Canned milk. Fats and oils Salted butter. Regular margarine. Ghee. Bacon fat. Seasonings and other foods Onion salt, garlic salt, seasoned salt, table salt, and sea salt. Canned and packaged gravies. Worcestershire sauce. Tartar sauce. Barbecue sauce. Teriyaki sauce. Soy sauce, including reduced-sodium. Steak sauce. Fish sauce. Oyster sauce. Cocktail sauce. Horseradish that you find on the shelf. Regular ketchup and mustard. Meat flavorings and tenderizers. Bouillon cubes. Hot sauce and Tabasco sauce. Premade or packaged marinades. Premade or packaged taco seasonings. Relishes. Regular salad dressings. Salsa. Potato and tortilla chips. Corn chips and puffs. Salted popcorn and pretzels. Canned or dried soups. Pizza. Frozen entrees and pot pies. Summary  Eating less sodium can help lower your blood pressure, reduce swelling, and protect your heart, liver, and kidneys.  Most people on this plan should limit their sodium intake to 1,500-2,000 mg (milligrams) of sodium each  day.  Canned, boxed, and frozen foods are high in sodium. Restaurant foods, fast foods, and pizza are also very high in sodium. You also get sodium by adding salt to food.  Try to cook at home, eat more fresh fruits and vegetables, and eat less fast food, canned, processed, or prepared foods. This information is not intended to replace advice given to you by your health care provider. Make sure you discuss any questions you have with your health care provider. Document Released: 11/17/2001 Document Revised: 05/21/2016 Document Reviewed: 05/21/2016 Elsevier Interactive Patient Education  2017 [employer].      If you need a refill on your cardiac medications before your next appointment, please call your pharmacy.

## 2017-04-15 ENCOUNTER — Encounter: Payer: Self-pay | Admitting: Adult Health

## 2017-04-15 ENCOUNTER — Ambulatory Visit: Payer: Medicare Other | Admitting: Adult Health

## 2017-04-15 DIAGNOSIS — L0390 Cellulitis, unspecified: Secondary | ICD-10-CM | POA: Insufficient documentation

## 2017-04-15 DIAGNOSIS — L03115 Cellulitis of right lower limb: Secondary | ICD-10-CM | POA: Diagnosis not present

## 2017-04-15 MED ORDER — CEPHALEXIN 500 MG PO CAPS: 500.0000 mg | ORAL_CAPSULE | Freq: Four times a day (QID) | ORAL | 0 refills | Status: DC

## 2017-04-15 NOTE — Assessment & Plan Note (Signed)
Right foot cellulitis   Plan  Patient Instructions  Keep foot clean with soap and water, pat dry .  Keep foot elevated.  Begin Keflex 500mg  Four times a day  For 1 week .  Follow up with Dr. Lenna Gilford  In 2 weeks and As needed   Please contact office for sooner follow up if symptoms do not improve or worsen or seek emergency care

## 2017-04-15 NOTE — Progress Notes (Signed)
@Patient  ID: Cheyenne [name], female    DOB: 27-Jan-1926, 83 y.o.   MRN: [id number]  Chief Complaint  Patient presents with  . Acute Visit    foot redness     Referring provider: Noralee Space, MD  HPI: 83 year old female followed for primary care by Dr. Teressa Lower .  She has multiple medical problems including pulmonary hypertension, hypertension atrial fibrillation diabetes, breast cancer status post mastectomy.  04/15/2017 Acute OV: Foot redness  Patient presents for an acute office visit.  Patient complains over the last week that she is noticed that her right foot has been more swollen and red on the top of her foot.  She has slight tenderness.  She denies any calf pain, increased shortness of breath, orthopnea fever.  She does have a history of gout but denies any increased joint pains..  She denies any known injury to her foot..   Allergies  Allergen Reactions  . Iohexol      Desc: IV History sheet from prior CT states IV Contrast allergy- 13 hour prep given.     Immunization History  Administered Date(s) Administered  . H1N1 06/07/2008  . Influenza Split 04/24/2011, 04/22/2012  . Influenza Whole 03/08/2009, 06/26/2010  . Influenza, High Dose Seasonal PF 03/12/2016, 03/13/2017  . Influenza,inj,Quad PF,6+ Mos 04/28/2013, 04/30/2014, 05/10/2015  . Pneumococcal Conjugate-13 11/09/2014  . Pneumococcal Polysaccharide-23 10/10/2009    Past Medical History:  Diagnosis Date  . Adenocarcinoma, breast ([place])    bilateral  . Allergic rhinitis   . Anxiety   . Borderline diabetes mellitus   . Diverticulosis   . DJD (degenerative joint disease)   . Headache(784.0)   . HTN (hypertension)   . Hypoxemia   . IBS (irritable bowel syndrome)   . Permanent atrial fibrillation ([place])   . Tubulovillous adenoma of colon 2006  . Venous insufficiency     Tobacco History: Social History   Tobacco Use  Smoking Status Never Smoker  Smokeless Tobacco Never Used   Counseling  given: Not Answered   Outpatient Encounter Medications as of 04/15/2017  Medication Sig  . acetaZOLAMIDE (DIAMOX) 250 MG tablet TAKE 2 TABLETS BY MOUTH DAILY AT 4PM  . albuterol (PROVENTIL HFA;VENTOLIN HFA) 108 (90 BASE) MCG/ACT inhaler Inhale 1-2 puffs into the lungs every 6 (six) hours as needed for wheezing or shortness of breath.  . allopurinol (ZYLOPRIM) 300 MG tablet TAKE 1 TABLET (300 MG TOTAL) BY MOUTH DAILY.  Marland Kitchen Cholecalciferol (VITAMIN D3) 5000 units CAPS Take 1 capsule by mouth daily.  . furosemide (LASIX) 40 MG tablet TAKE 2 TABLET EVERY MORNING  . loperamide (IMODIUM) 2 MG capsule Take 2 mg by mouth as needed for diarrhea or loose stools.   . Multiple Vitamin (MULTIVITAMIN) capsule Take 1 capsule by mouth daily.    . Multiple Vitamins-Minerals (PRESERVISION AREDS PO) Take 1 tablet by mouth daily.  Marland Kitchen oxybutynin (DITROPAN-XL) 10 MG 24 hr tablet TAKE 1 TABLET EVERY DAY FOR BLADDER SPASMS  . potassium chloride SA (KLOR-CON M20) 20 MEQ tablet Take 1 tablet (20 mEq total) by mouth 2 (two) times daily.  . propranolol (INDERAL) 80 MG tablet TAKE 1/2 TABLET BY MOUTH TWO TIMES DAILY  . quinapril (ACCUPRIL) 40 MG tablet TAKE 1 TABLET (40 MG TOTAL) BY MOUTH DAILY.  Marland Kitchen warfarin (COUMADIN) 5 MG tablet TAKE AS DIRECTED BY COUMADIN CLINIC  . cephALEXin (KEFLEX) 500 MG capsule Take 1 capsule (500 mg total) 4 (four) times daily by mouth.   No facility-administered encounter  medications on file as of 04/15/2017.      Review of Systems  Constitutional:   No  weight loss, night sweats,  Fevers, chills, fatigue, or  lassitude.  HEENT:   No headaches,  Difficulty swallowing,  Tooth/dental problems, or  Sore throat,                No sneezing, itching, ear ache, nasal congestion, post nasal drip,   CV:  No chest pain,  Orthopnea, PND,  anasarca, dizziness, palpitations, syncope.   GI  No heartburn, indigestion, abdominal pain, nausea, vomiting, diarrhea, change in bowel habits, loss of appetite,  bloody stools.   Resp: No shortness of breath with exertion or at rest.  No excess mucus, no productive cough,  No non-productive cough,  No coughing up of blood.  No change in color of mucus.  No wheezing.  No chest wall deformity  Skin: no rash or lesions.  GU: no dysuria, change in color of urine, no urgency or frequency.  No flank pain, no hematuria   MS:  No joint pain or swelling.  No decreased range of motion.  No back pain.    Physical Exam  Pulse 60   Ht 5\' 3"  (1.6 m)   Wt 132 lb 6.4 oz (60.1 kg)   SpO2 100%   BMI 23.45 kg/m   GEN: A/Ox3; pleasant , NAD, elderly  HEENT:  [place]/AT,  EACs-clear, TMs-wnl, NOSE-clear, THROAT-clear, no lesions, no postnasal drip or exudate noted.   NECK:  Supple w/ fair ROM; no JVD; normal carotid impulses w/o bruits; no thyromegaly or nodules palpated; no lymphadenopathy.    RESP  Clear  P & A; w/o, wheezes/ rales/ or rhonchi. no accessory muscle use, no dullness to percussion  CARD:  RRR, no m/r/g, 1+ peripheral edema, pulses intact, no cyanosis or clubbing. Neg calf tenderness, neg homans sign   GI:   Soft & nt; nml bowel sounds; no organomegaly or masses detected.   [place]co: Warm bil, no deformities or joint swelling noted. No joint tenderness along right foot.   Neuro: alert, no focal deficits noted.    Skin: Warm,  Right dorsal aspect of foot with redness and warm to touch  Dry skin .     Lab Results:  CBC  BMET  No results found.   Assessment & Plan:   Cellulitis Right foot cellulitis   Plan  Patient Instructions  Keep foot clean with soap and water, pat dry .  Keep foot elevated.  Begin Keflex 500mg  Four times a day  For 1 week .  Follow up with Dr. Lenna Gilford  In 2 weeks and As needed   Please contact office for sooner follow up if symptoms do not improve or worsen or seek emergency care         Rexene Edison, NP 04/15/2017

## 2017-04-15 NOTE — Patient Instructions (Signed)
Keep foot clean with soap and water, pat dry .  Keep foot elevated.  Begin Keflex 500mg  Four times a day  For 1 week .  Follow up with Dr. Lenna Gilford  In 2 weeks and As needed   Please contact office for sooner follow up if symptoms do not improve or worsen or seek emergency care

## 2017-04-23 ENCOUNTER — Other Ambulatory Visit: Payer: Self-pay | Admitting: Pulmonary Disease

## 2017-04-23 ENCOUNTER — Ambulatory Visit (INDEPENDENT_AMBULATORY_CARE_PROVIDER_SITE_OTHER): Payer: Medicare Other | Admitting: *Deleted

## 2017-04-23 DIAGNOSIS — I4891 Unspecified atrial fibrillation: Secondary | ICD-10-CM | POA: Diagnosis not present

## 2017-04-23 DIAGNOSIS — Z7901 Long term (current) use of anticoagulants: Secondary | ICD-10-CM

## 2017-04-23 DIAGNOSIS — Z5181 Encounter for therapeutic drug level monitoring: Secondary | ICD-10-CM

## 2017-04-23 LAB — POCT INR: INR: 2.5

## 2017-04-23 NOTE — Telephone Encounter (Signed)
Pt has completed course of keflex for foot swelling- states that swelling has improved but not completely resolved.  Pt states she is now able to get her foot into a shoe, but is requesting further recs.    SN please advise.  Thanks.

## 2017-04-23 NOTE — Telephone Encounter (Signed)
Per SN: The abx prescribed should take care of the cellulitis.  Be sure to elevate foot, and we can move rov sooner if pt is concerned.  Spoke with pt, aware of recs.  States she has not been elevating foot, will try this.  Wishes to keep rov on 11/28 as scheduled.  Nothing further needed.

## 2017-04-23 NOTE — Patient Instructions (Addendum)
Call with any new medications or any scheduled procedures  [phone] Continue taking 1 tablet daily except 1.5 tablets on Tuesdays.  Recheck in 3 weeks.

## 2017-04-28 ENCOUNTER — Other Ambulatory Visit: Payer: Self-pay | Admitting: Pulmonary Disease

## 2017-05-08 ENCOUNTER — Encounter: Payer: Self-pay | Admitting: Pulmonary Disease

## 2017-05-08 ENCOUNTER — Ambulatory Visit: Payer: Medicare Other | Admitting: Pulmonary Disease

## 2017-05-08 VITALS — BP 126/78 | HR 57 | Temp 97.9°F | Ht 63.0 in | Wt 135.2 lb

## 2017-05-08 DIAGNOSIS — R609 Edema, unspecified: Secondary | ICD-10-CM

## 2017-05-08 DIAGNOSIS — I272 Pulmonary hypertension, unspecified: Secondary | ICD-10-CM

## 2017-05-08 DIAGNOSIS — Z7901 Long term (current) use of anticoagulants: Secondary | ICD-10-CM

## 2017-05-08 DIAGNOSIS — L03115 Cellulitis of right lower limb: Secondary | ICD-10-CM

## 2017-05-08 DIAGNOSIS — I481 Persistent atrial fibrillation: Secondary | ICD-10-CM

## 2017-05-08 DIAGNOSIS — I872 Venous insufficiency (chronic) (peripheral): Secondary | ICD-10-CM

## 2017-05-08 DIAGNOSIS — R413 Other amnesia: Secondary | ICD-10-CM

## 2017-05-08 DIAGNOSIS — R0902 Hypoxemia: Secondary | ICD-10-CM

## 2017-05-08 DIAGNOSIS — Z853 Personal history of malignant neoplasm of breast: Secondary | ICD-10-CM

## 2017-05-08 DIAGNOSIS — I10 Essential (primary) hypertension: Secondary | ICD-10-CM

## 2017-05-08 DIAGNOSIS — I4819 Other persistent atrial fibrillation: Secondary | ICD-10-CM

## 2017-05-08 DIAGNOSIS — Z5181 Encounter for therapeutic drug level monitoring: Secondary | ICD-10-CM

## 2017-05-08 NOTE — Progress Notes (Signed)
Subjective:    Patient ID: Cheyenne [name], female    DOB: 30-Oct-1925, 83 y.o.   MRN: [id number]  HPI 83 y/o BF here for a follow up visit... she has multiple medical problems as noted below>> ~  SEE PREV EPIC NOTES FOR OLDER DATA >>    LABS 11/13:  Chems- ok w/ K=3.7 CO2=34 BUN=23 Creat=0.9;  BNP=470...   CXR 5/14 showed cardiomeg, clear lungs, DJD spine, NAD...  LABS 5/14:  Chems- ok x K=3.4;  CBC- ok w/ Hg=12.3;  TSH=1.38;  VitD=31;  BNP=463...  CXR 5/15 showed mild cardiomeg, clear lungs, DJD spine, surg clips right axilla, NAD...  LABS 5/15:  Chems- ok x K=3.4 on K20/d & asked to incr back to Bid;    Ambulate on RA:  96% O2sat on RA at rest w/ HR=52;  87% on RA after 1lap w/ HR=109 => continue same O2 therapy...   LABS 11/15:  BMet- ok w/ K=4.1, HCO3=33, BUN=16, Cr=0.9.Marland Kitchen.  ~  Nov 03, 2014:  76mo ROV & Cheyenne [name] has had a stable interval- CC is itching on her back, no rash, no new meds etc; states her breathing is ok, DOE w/o change, no CP, min cough & Hycodan prn helps; weight is down 5# to 151# & less edema, reminded to elim all salt from her diet... We reviewed the following medical problems during today's office visit >>     Hypoxemia, PulmHTN> she has HomeO2 (not using it all the time), ProairHFA prn, & Hycodan cough syrup; she feels her breathing is at baseline; last 2DEcho 5/13 w/ PAsys~50 & stable...    HBP> on ASA81, Propran80-1/2Bid, Quinapril40, Lasix40-2Qam, Diamox250-2Qpm, & K20Bid; BP= 118/70 & she denies CP, palpit, ch in SOB, & decr edema w/ 5# wt loss.    AFib, SSS, Tachy-brady> on Coumadin (via CC); seen by DrKlein for Cards/EP in the past...    Venous Insuffic & edema> she had incr ankle edema & improved w/ elim sodium, elev legs, support hose & continue same diuretics...    DM> on diet alone; wt is down to 151# today; BS has been normal ~100...    GI- Divertics, IBS, Polyps> on Immodium prn; she saw DrStark 12/13> IBS, hx divertics & colon polyps, he did not rec  surveillance colonoscopy for her...    GU- bladder symptoms> she has freq & urge/stress incont- trial Oxybutynin10mg  & eval by Urology DrManny> rec changing diuretics if poss but it is not poss...    Hx bilat breast cancers> s/p mastectomies in 1996 & 2002, followed by [facility] & doing well w/o known recurrence...    DJD> on Allopurinol 300, Tramadol50; stable w/ mod DJD & limited mobility (knees, hips)...    Early dementia, anxiety> Aware- family helps w/ her meds & helps at home... We reviewed prob list, meds, xrays and labs> see below for updates >> she is due for PREVNAR-13 shot today...  CXR 5/16 showed mild cardiomeg, clear lungs, no effusion, NAD...   LABS 5/16>  Chems- wnl w/ HCO3=34, Cr=0.90;  CBC- wnl w/ Hg=12.1, Fe=65 (22%sat), Ferritin=113;  TSH=2.65...  IMP/PLAN >> [name] is reasonably stable at age 86- given Prevnar-47 today, meds refilled by request, same meds...   ~  May 10, 2015:  71mo ROV & Cheyenne [name] reports doing well- no new complaints or concerns; Epic review shows several calls for refill Hycodan cough syrup (says she uses it for sleep!=> rec to try TylenolPM & Delsym, not the Hydrocodon);  She says her breathing is improved, denies  SOB/ cough/ sput, CP, palpit, etc;  She uses her O2 w/ activity & she exercises w/ Silver Sneakers once per wk & exercise bike at home...    Hypoxemia, PulmHTN> she has HomeO2 (not using it now), ProairHFA prn, & prev Hycodan cough syrup; she feels her breathing is at baseline; last 2DEcho 5/13 w/ PAsys~50 & stable...    HBP> on ASA81, Propran80-1/2Bid, Accupril40, Lasix40-2Qam, Diamox250-2Qpm, & K20Bid; BP= 134/76 & she denies CP, palpit, ch in SOB, & decr edema w/ 5# wt loss.    AFib, SSS, Tachy-brady> on Coumadin (via CC); seen by DrKlein for Cards/EP in the past; denies CP, palpit, change in SOB/edema...    Venous Insuffic & edema> she had incr ankle edema & improved w/ elim sodium, elev legs, support hose & continue same diuretics...    DM>  on diet alone; wt is down to 144# today; BS has been normal ~100...    GI- Divertics, IBS, Polyps> on Immodium prn; she saw DrStark 12/13> IBS, hx divertics & colon polyps, he did not rec surveillance colonoscopy for her...    GU- bladder symptoms> she has freq & urge/stress incont- trial Oxybutynin10mg  & eval by Urology DrManny> rec changing diuretics if poss but it is not poss...    Hx bilat breast cancers> s/p mastectomies in 1996 & 2002, followed by [facility] & doing well w/o known recurrence...    DJD> on Allopurinol 300, Tramadol50, VitD2000; stable w/ mod DJD & limited mobility (knees, hips); she saw DrZSmith for neck pain, DDD, musc spasm, decr ROM- given exercises, ice/heat, Pennsaid, Tylenol, VitD rx.    Early dementia, anxiety> Aware- family helps w/ her meds & helps at home... EXAM shows Afeb, VSS, O2sat=96% on RA at rest;  Heent- neg, mallampati2;  Chest- clear w/o w/r/r; Heart- RR gr1/6 w/o r/g; Abd- soft, nontender, neg; Ext- VI, trace edema; Neuro- memory prob, no focal abn... IMP/PLAN>>  Cheyenne [name] is stable, same meds, OK 2016 Flu vaccine today, we plan ROV 50mo...   ~  September 02, 2015:  61mo ROV & add-on appt requested post ER visit> Cheyenne [name] went to the ER 08/29/15 w/ a "bump" on her vagina- it had been many yrs since she saw a Gyn, note made of her AFib hx and coumadin clinic rx; they noted bruising in her lower abd wall towards the left side & in her vaginal area- initially w/ some mild discomfort but no severe pain or bleeding; she denied any injury but later mentioned her exercise bike w/ uncomfortable seat that comes to a point near her pubis (she rides for 1H nightly while watching [employer]!);  She was evaluated by DrZammit including EXAM that showed neg abd exam x ecchymosis over lower left abd wall and mons pubis, +hematoma described in left labial fold, remainder of exam neg;  LABS showed Hg=11.5, Protime=23.9/ INR=2.15, CT Abd&Pelvis showed 5.2 x 2.7 cm intermediate soft tissue  density in the left perineum deep to the labia suggesting a hematoma;  Coumadin was held & she was asked to follow up w/ Gyn regarding the hematoma & here regarding the coumadin rx...    She saw Dr. Linda Hedges w/ vulvar hematoma resolving on conservative management...    She has held the Coumadin since 3/20, not riding the bike since then either, and she notes that the bruising is slowly diminishing, the "knot" seems to be getting smaller, bruising about the same- she denies any pain at present;  Plan is to continue off Coumadin until her Gyn  check on 09/05/15, and as long as the deep hematoma appears to be resolving then we will resume her anticoag on a sl lower dose to start... EXAM shows Afeb, VSS, O2sat=97% on 2L/min pulse dose;  HEENT- neg, mallampati2;  Chest- clear w/o w/r/r;  Heart- irreg Afib, rate60, gr1/6 SEM w/o r/g;  Abd- soft nontender, bruising in LLQ;  Ext= VI, trace edema;  Neuro- nonfocal... IMP/PLAN>>  Perineum hematoma likely from exercise bike seat trauma + her Coumadin therapy for AFib;  She appears stable off the Coumadin for the past 4d & notes that there is no pain & the "knot" appears to be getting smaller;  Plan is to remain off Coumadin until her Gyn check on 3/27 & if appears satis then we will rec resuming her anticoag rx at that time...   ~  Nov 09, 2015:  63mo ROV & pulmonary/medical recheck>  Her pelvic hematoma has resolved and she is back on Coumadin- followed closely in the CC, labs reviewed;  She has noted some swelling in feet & ankles, incr DOE, some orthopnea;  She is on Lasix40-2Qam + KCl Bid but she is NOT restricting dietary sodium...     EXAM shows Afeb, VSS, O2sat=92% on 2L/min pulse dose;  HEENT- neg, mallampati2;  Chest- clear w/o w/r/r;  Heart- irreg Afib, rate60, gr1/6 SEM w/o r/g;  Abd- soft nontender, bruising in LLQ;  Ext= VI, 2+ edema... IMP/PLAN>>  Cheyenne [name] is generally stable but has incr edema & needs NO SALT diet, continue Lasix80Qam;  She wants to  restart exercise bike & has new seat- so- OK... we plan ROV in 54mo w/ labs etc...  ~  March 12, 2016:  81mo ROV & Cheyenne [name] reports doing well- no new complaints or concerns;  Her breathing is good, uses her port O2 just as needed, notes swelling is diminished  & she's using the support hose    Hypoxemia, PulmHTN> she has HomeO2 (using it prn), ProairHFA prn, & prev Hycodan cough syrup; she feels her breathing is at baseline; last 2DEcho 5/13 w/ PAsys~50 & stable...    HBP> on ASA81, Propran80-1/2Bid, Accupril40, Lasix40-2Qam, Diamox250-2Qpm, & K20Bid; BP= 124/78 & she denies CP, palpit, ch in SOB, & decr edema w/ 5# wt loss; K~3.5-4.1, BWI2~03-55    AFib, SSS, Tachy-brady> on Coumadin (via CC); seen by DrKlein for Cards/EP in the past; denies CP, palpit, change in SOB/edema...    Venous Insuffic & edema> she had incr ankle edema & improved w/ elim sodium, elev legs, support hose & continue same diuretics...    DM> on diet alone; wt is down to 144# today; BS has been normal ~100...    GI- Divertics, IBS, Polyps> on Immodium prn; she saw DrStark 12/13> IBS, hx divertics & colon polyps, he did not rec surveillance colonoscopy for her...    GU- bladder symptoms> she has freq & urge/stress incont- trial Oxybutynin10mg  & eval by Urology DrManny> rec changing diuretics if poss but it is not poss...    Hx bilat breast cancers> s/p mastectomies in 1996 & 2002, followed by [facility] & doing well w/o known recurrence...    DJD> on Allopurinol 300, Tramadol50, VitD2000; stable w/ mod DJD & limited mobility (knees, hips); she saw DrZSmith for neck pain, DDD, musc spasm, decr ROM- given exercises, ice/heat, Pennsaid, Tylenol, VitD rx.    Early dementia, anxiety> Aware- family helps w/ her meds & helps at home... EXAM shows Afeb, VSS, O2sat=96% on RA at rest;  Heent- neg, mallampati2;  Chest-  clear w/o w/r/r; Heart- RR gr1/6 w/o r/g; Abd- soft, nontender, neg; Ext- VI, 1+edema; Neuro- memory prob, no focal  abn... IMP/PLAN>>  Cheyenne [name] is stable, continue same meds, OK Flu shot today...  ~  September 10, 2016:  55mo ROV & Cheyenne [name] reports a quiet interval- breathing well, no new complaints or concerns;  She has portable O2 that she uses w/ activity & exertion but takes it off at rest w/ O2sats in the 90s;  She denies CP, palpit, dizzy, etc but she has VI & +pitting edema despite low sodium diet, support hose, elevation and Demadex/ Diamox diuretic regimen... Problem list reviewed, doing satis & no active issues...     EXAM shows Afeb, VSS, O2sat=99% on RA at rest;  Heent- neg, mallampati2;  Chest- clear w/o w/r/r; Heart- RR gr1/6 w/o r/g; Abd- soft, nontender, neg; Ext- VI, 1+edema; Neuro- memory prob, no focal abn...  LABS 09/10/16>  Chems- ok w/ K=3.9, HCO3=33, BS=95, Cr=0.98;  CBC- wnl w/ Hg=12.2;  TSH=2.79;  BNP=393 IMP/PLAN>>  Cheyenne [name] is remarkably stable given her medical issues;  Rec to continue same meds & call for any problems...  ~  December 18, 2016:  25mo ROV & add-on appt requested for bruising on left hand>  Pt tells me that about 1wk ago she noticed a knot on the dorsum of her left hand, then developed a bruise that extended down her fingers & covered the back of her hand; she denies known trauma but the knot sounds like a small hematoma=> ecchmosis;  She denies pain, warmth, drainage, etc;  Recall hx vulvar hematoma 08/2015 due to trauma from exercise bike seat, it resolved w/ conservative measures;  She is on Coumadin for AFib & her last 3 protimes showed INR 2.2-2.3, but late last week it was 3.5 & dose decreased by the clinic staff; pt/ daugh report otherw she is stable- doing satis...    EXAM shows Afeb, VSS, O2sat=96% on RA at rest;  Wt decr to 134#;  Heent- neg, mallampati2;  Chest- clear w/o w/r/r; Heart- RR gr1/6 w/o r/g; Abd- soft, nontender, neg; Ext- VI, 1+edema; Neuro- memory prob, no focal abn...  LAB 12/13/16>  Coumadin clinic PT/INR=3.5 and her dose was decr to 1x5d=MWThFSu, and 1.5tabs  x2d=TSa... IMP/PLAN>>  She has had another mild bleeding episode- this one an ecchymosis on her left hand, no known trauma; she is asymptomatic- discussed cool compresses etc if needed; Coumadin clinic will watch her carefully & I have asked her to maintain her regular f/u appts...   ~  March 13, 2017:  42mo ROV & Cheyenne [name] is stable "feeling OK" she says but c/o back itching and occas dizziness;  No further bleeding episodes- followed carefully by CC;  She notes that memory issues frustrate her & her daughter "I just keep losing stuff" (they decline offer for Aricept medication)... We reviewed the following medical problems during today's office visit >>     Hypoxemia, PulmHTN> she has HomeO2 (using it prn), ProairHFA prn, & prev Hycodan cough syrup; she feels her breathing is at baseline- denies cough, sputum, CP, etc; last 2DEcho 5/13 w/ PAsys~49 & stable...    HBP> on ASA81, Propran80-1/2Bid, Accupril40, Lasix40-2Qam, Diamox250-2Qpm, & K20Bid; BP= 126/80 & she denies CP, palpit, ch in SOB, & decr edema; K~3.9-4.7, OHY0~73-71    AFib, SSS, Tachy-brady> on Coumadin (via CC); seen by DrKlein for Cards/EP in the past; denies CP, palpit, change in SOB/edema; 2DEcho 5/13 showed norm LVF w/ EF=55%, mild MR&AI.Marland KitchenMarland Kitchen  Venous Insuffic & edema> she had incr ankle edema & improved w/ elim sodium, elev legs, support hose & continue same diuretics...    DM> on diet alone; wt is down to 136# today; BS has been normal ~100...    GI- Divertics, IBS, Polyps> on Imodium prn; she saw DrStark 12/13> IBS, hx divertics & colon polyps, he did not rec surveillance colonoscopy for her...    GU- bladder symptoms> she has freq & urge/stress incont- trial Oxybutynin10mg  & eval by Urology DrManny> rec changing diuretics if poss but it is not poss...    Hx bilat breast cancers> s/p mastectomies in 1996 & 2002, followed by [facility] & doing well w/o known recurrence...    DJD> on Allopurinol 300, Tramadol50, VitD2000; stable w/ mod  DJD & limited mobility (knees, hips); she saw DrZSmith for neck pain, DDD, musc spasm, decr ROM- given exercises, ice/heat, Pennsaid, Tylenol, VitD rx.    Early dementia, anxiety> Aware- family helps w/ her meds & helps at home... EXAM shows Afeb, VSS, O2sat=100% on 3L/min pulse-dose at rest;  Heent- neg, mallampati2;  Chest- clear w/o w/r/r; Heart- RR gr1/6 w/o r/g; Abd- soft, nontender, neg; Ext- VI, 1+edema; Neuro- memory prob, no focal abn... IMP/PLAN>>  Cheyenne [name] is stable- no further bleeding & CC is watching protimes closely;  OK FLU shot today;  She declines to start Aricept etc;  rec to continue same meds...    ~  May 08, 2017:  30mo ROV & here for 3wk rov after being seen by TP on 04/15/17 for cellulitis in right foot-- she reports much improved s/p Keflex rx, no further pain, redness resolved, and she feels back to baseline; she has known VI & some dependent edema, supposed to be on Low salt diet, Lasix40-2Qam & Diamox250-2Qpm, along w/ KCl 20bid... See prob list above, reviewed...  We reviewed the following interval Epic notes>      She saw CARDS-DrAllred 04/08/17> f/u HBP, HxSSS, AFib on Coumadin, valvular heart dis w/ mild MR&AI, VI, edema- EKG w/ AFib, rate73, NSSTTW changes;  BP stable on Inderal, Accupril, Lasix/ Diamox/ KCl; told she could stop ASA & to continue Coumadin, f/u 76yr...    She saw TParrett,NP 04/15/17> acute visit for red swollen right foot, sl tender; denied CP/ SOB f/c/s; no known injury; Exam showed dorsum right foot red & warm, sl tender; Dx w/ celluliitis 7 treated w/ Keflex=> 3wk rov now. EXAM shows Afeb, VSS, O2sat=96% on 3L/min pulse-dose at rest;  Heent- neg, mallampati2;  Chest- clear w/o w/r/r; Heart- RR gr1/6 w/o r/g; Abd- soft, nontender, neg; Ext- VI, 1+edema, no erythema; Neuro- memory prob, no focality. IMP/PLAN>>  Right foot cellulitis resolved, she has underlying VI/ edema, and problems as noted; rec to continue same meds & keep prev sched rov in  April2019...            Problem List:  ALLERGIC RHINITIS (ICD-477.9) - on FLONASE Qhs & ZYRTEK QAM Prn.  Hx of HYPOXEMIA (ICD-799.02) - full eval for hypoxemia in [place] by 2DEcho ~  7/08:  CXR= cardiomegaly, ectatic Ao, pulm ven HTN... CTAngio= neg, without PE, min scarring in lingula... 2DEcho= norm LVF w/ EF=55%, LVwall thickness at upper limits, mild ca++ AoV w/ mild AI,  PA sys pressure = 6mmHg... O2 sats 90-94 at rest on RA... w/ ambulation in the office sats drop to 85%... she didn't yet want to go on oxygen at home... just occas SOB w/ activity- she is too sedentary and needs  to incr her exercise program. ~  CTChest 6/09 showed bilat mastectomies, no adenopathy, no fluid, 2 sm nodules 4-74mm size RML/LLL, NAD... ~  6/10: c/o increased DOE & no energy- O2 sat= 91% rest & 86% w/activ... rec> home O2 1L/min rest, 2L/min exercise. ~  4/11 hosp> V/Q scan w/ normal vent & norm perfusion... CXR 4/11 showed cardiomeg, clear lungs, no CHF, surg clips in right axilla... ~  8/11:  pt requesting change to LiqO2 to incr mobility. ~  11/12:  O2 sat= 94% at rest on 2L/min oxygen via [place]... remains stable. ~  11/13:  O2 sat= 100% on 2L/min at rest... ~  CXR 5/14 showed cardiomeg, clear lungs, DJD spine, NAD ~  16/10:  O2 recert today> O2 sat on RA at rest=96% w/ HR=65/min;  Ambulated 3 laps on RA w/ nadir O2 sat=88% w/ HR=116; Rec to continue O2 w/ exercise & Qhs... ~  CXR 5/15 showed mild cardiomeg, clear lungs, DJD spine, surg clips right axilla, NAD.Marland Kitchen. ~  5/15: Ambulate on RA:  96% O2sat on RA at rest w/ HR=52;  87% ob RA after 1lap w/ HR=109 => continue same O2 therapy ~  11/15: she remains on O2 at 2-3L/min flow; O2sats on 3L/min today = 98%... ~  5/16: she has port O2 at home but states not using it now; O2sat=94% on RA at rest today...  ~  11/16: she remains on O2 at 2L/min pulse dose w/ exercise...  HYPERTENSION (ICD-401.9) - controlled on PROPRANOLOL  80mg Bid, QUINAPRIL 40mg /d; and back on LASIX 40mg AM, KCL 69mEqAM, & ACETAZOLAMIDE 250mg - 2tabsPM... she knows to avoid sodium etc... ~  labs 2/09 showed Na143, K3.6, CL101, CO2 36, BUN 15, Cr0.9.Marland KitchenMarland Kitchen BNP was 194... ~  labs 6/09 showed lytes normal x TCO2= 37, & BNP= 512... ~  labs 12/09 showed HCO3- 36,  BNP= 586... rec- same meds, no salt! ~  labs 6/10 showed Na= 146, K= 3.6, CO2= 36, BUN= 14, Creat= 0.9, BNP= 336. ~  labs 9/10 showed Na-147, K=3.7, HCO3=36, BUN=18, Creat=0.9, BNP=484 ~  labs 1/11 showed Na=144, K=3.9, HCO3=35, BUN=17, Creat=0.9, BNP=488 ~  [facility] 4/11 & meds changed by TH> off diuretics & KCl... CXR showed cardiomeg, clear lungs, clips right axilla. ~  labs 10/15/09 showed Na=143, K=4.0, HCO3=37, BUN=12, Creat=0.8, BNP=761... restart Lasix, Diamox, KCl (one each). ~  labs 10/25/09 showed HCO3=44, BNP=588... rec> incr Diamox to 2Qpm... ~  labs 8/11 showed HCO3=39, BNP=481... continue same. ~  Labs 5/12 showed HCO3=35, BNP=373... Improved, continue same Rx. ~  Labs 11/12 showed HCO3=37, BNP=351... Stable continue same meds. ~  5/13: BP= 138/88 & HCO3=39, BUN=17, Creat=0.9, BNP=534... Reminded to take meds daily. ~  11/3: on ASA81, Propran80Bid, Quinapril40, Lasix40, Diamox250-2/d, & K20; BP= 128/82 & she denies CP, palpit, ch in SOB or edema...  ~  5/14: on ASA81, Propran80Bid, Quinapril40, Lasix40, Diamox250-2/d, & K20Bid; BP= 136/80 & she denies CP, palpit, ch in SOB or edema ~  11/14: on ASA81, Propran80-1/2Bid, Quinapril40, Lasix40, Diamox250-2/d, & K20Bid; BP= 142/80 & she remains essentially asymptomatic... ~  5/15: on ASA81, Propran80-1/2Bid, Quinapril40, Lasix40, Diamox250-2/d, & K20Bid; BP= 144/80 & she denies CP, palpit, ch in SOB, but has developed ankle edema & 5# wt gain ~  11/15: on ASA81, Propran80-1/2Bid, Quinapril40, Lasix40, Diamox250-2/d, & K20Bid; BP= 120/80 & she remains minimally symptomatic but minimally active as well... ~  5/16: on ASA81, Propran80-1/2Bid,  Quinapril40, Lasix40-2Qam, Diamox250-2Qpm, & K20Bid; BP= 118/70 & she denies CP, palpit, ch in SOB, & decr  edema w/ 5# wt loss.  ATRIAL FIBRILLATION (ICD-427.31) & SICK SINUS/ TACHY-BRADY SYNDROME (ICD-427.81) - new prob 1/10 w/ eval by DrKlein- rate control strategy on Propranolol w/ Coumadin [facility]. ~  7/08:  2DEcho= norm LVF w/ EF=55%, LVwall thickness at upper limits, mild ca++ AoV w/ mild AI,  PA sys pressure = 58mmHg... ~  EKG 1/10 showed AFib, rate~ 60, NSSTTWA... ~  2DEcho 3/10 showed norm LVF w/ EF= 60% & no regional wall motion abn, mild calcif AoV w/ mild AI, mild LA & RA dil w/ incr thickness of interatrial septum c/w lipomatous hypertrophy, PA sys est at 50... ~  Holter monitor 3/10 = AFib w/ rate 45-97... ~  2DEcho 4/11 in hosp showed norm LVwall thickness & EF=55%, mild RVdil w/ mod decr RV function, mod TR & PAsys~69... ~  2DEcho 5/13 showed normal LVF w/ EF=55%, mild AI & MR, mild LA & RA dilatation, PAsys=49... Stable. ~  Stable on Coumadin & followed by DrKlein w/ rate control strategy... ~  08/2015> Cheyenne [name] had a vulvar hematoma caused by trauma from an exercise bike seat in conjunction w/ her coumadin=> it was held tempoarily & later restarted... ~  12/2016> She developed an ecchymosis on dorsum of left hand, no known trauma but INR was 3.5;  Coumadin adjusted by CC...  PULMONARY HYPERTENSION >> Serial 2DEchos showed +- stable PA sys estimates (see above). ~  V/Q lung scan 4/11 was neg for PE... ~  2DEcho 5/13 showed normal LVF w/ EF=55%, mild AI & MR, mild LA & RA dilatation, PAsys=49... Stable. ~  She is way too sedentary but not motivated to exercise etc... ~  Subsequently started exercising on a stationary bike at home...  VENOUS INSUFFICIENCY (ICD-459.81) - mild persist edema despite the sodium restriction, Lasix and Diamox... reviewed recommendation for No Salt, Elevation, TED's, etc... ~  11/15: she presented w/ 10# further wt gain and 4+edema in feet;  BMet was OK & we decided to incr Lasix40 to 2tabsQam, continue other meds 7 recheck labs in 6wks... ~  6/15: she had incr ankle edema & improved w/ elim sodium, elev legs, support hose & continue same diuretics  DIABETES MELLITUS, BORDERLINE (ICD-790.29) - prev on Metformin 500mg /d (held after 4/11 hosp)... ~  last BS=181, HgA1c=6.4 in 11/08... ~  labs 6/09 showed BS= 101, HgA1c= 6.4 ~  labs 12/09 showed BS= 139,  HgA1c= 6.1 ~  labs 6/10 showed BS= 123, A1c= 6.1 ~  7/10: neg ophthal f/u DrHecker- no retinopathy, no macular edema. ~  labs 9/10 showed BS= 93 ~  labs 1/11 showed BS=146, A1c=5.7 ~  labs 5/11 showed BS= 100 on diet alone... ~  labs 8/11 showed BS= 96 ~  Labs 5/12 showed BS= 113, A1c= 5.8 ~  9/12:  Ophthalmology check by DrHecker- no DM retinopathy... ~  Labs 11/12 showed BS= 104 ~  Labs 5/13 showed BS= 118 ~  Labs 11/13 showed BS= 97 ~  2/14: she had a neg Ophthalmology eval by DrHecker... ~  Labs 5/14 showed BS= 102 on diet alone... ~  On diet alone, BS ~100 and stable...  DIVERTICULOSIS OF COLON, IRRITABLE BOWEL SYNDROME & COLONIC POLYPS (ICD-211.3) ~  colonoscopy was 11/06 by DrStark showing divertics and several polyps (one 70mm adenoma)... f/u 24yrs. ~  colonoscopy 1/10 by DrStark showed divertics (severe in sigmoid), 36mm polyp= tub adenoma... f/u 3 yrs. ~  12/13:  She had GI f/u DrStark> IBS w/ freq loose stools, occas BRB, hx extensive  divertics & adenomatous polyps, on coumadin for AFib;   URINARY FREQ&URGENCY >>  ~  11/13: she had a GU eval by DrManny> c/o freq & urgency assoc w/ Lasix, some leakage & managing w/ one pad/d, some better w/ Oxybutynin... ~  5/14: she had f/u DrManny, Urology> urinary freq & urgency; worse w/ Lasix; mild leakage & uses 1 pad/d; Oxybutynin caused dry mouth; situation is acceptable & no further meds rec...  BILAT BREAST CANCERS - resected via mastectomies in 1996 and 2002... followed by [facility] annually & seen 8/12> note reviewed. ~   1/15: she had her yearly f/u by [facility]- his note is reviewed, stable, wound over sternum is resolved...   DEGENERATIVE JOINT DISEASE (ICD-715.90) - w/ hx hyperuricemia on ALLOPURINOL 300mg /d & has TRAMADOL 50mg  prn pain...  Hx of HEADACHE (ICD-784.0) ~  MRI Brain 4/11 showed atrophy & sm vessel dis, degen changes at C1-2 w/ some sp stenosis caused part by ant slip of the C1 ring, incidental part empty sella...  ANXIETY (ICD-300.00) - prev rec to take Alpraz 0,5mg  Prn but she never filled the Rx. ~  Remeron 7.5mg /d started 4/11 hosp & stopped 5/11 due to lethargy, sleepiness...   Past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

## 2017-05-08 NOTE — Patient Instructions (Addendum)
Today we updated your med list in our EPIC system...    Continue your current medications the same...  Remember-- NO SALT, elev legs, wear support hose...  For the ringworm- skin fungal infection>    Try OTC LAMISIL cream twice daily...  Call for any questions...  Let's plan to keep her prev scheduled follow up 09/26/17 at 10AM

## 2017-05-12 ENCOUNTER — Other Ambulatory Visit: Payer: Self-pay | Admitting: Internal Medicine

## 2017-05-14 ENCOUNTER — Ambulatory Visit (INDEPENDENT_AMBULATORY_CARE_PROVIDER_SITE_OTHER): Payer: Medicare Other | Admitting: Pharmacist

## 2017-05-14 DIAGNOSIS — Z5181 Encounter for therapeutic drug level monitoring: Secondary | ICD-10-CM | POA: Diagnosis not present

## 2017-05-14 DIAGNOSIS — Z7901 Long term (current) use of anticoagulants: Secondary | ICD-10-CM

## 2017-05-14 DIAGNOSIS — I4891 Unspecified atrial fibrillation: Secondary | ICD-10-CM

## 2017-05-14 LAB — POCT INR: INR: 2.8

## 2017-05-14 NOTE — Patient Instructions (Signed)
Continue taking 1 tablet daily except 1.5 tablets on Tuesdays. Recheck in 3 weeks.  Call with any new medications or any scheduled procedures  [phone]

## 2017-06-01 ENCOUNTER — Other Ambulatory Visit: Payer: Self-pay | Admitting: Pulmonary Disease

## 2017-06-12 ENCOUNTER — Ambulatory Visit (INDEPENDENT_AMBULATORY_CARE_PROVIDER_SITE_OTHER): Payer: Medicare Other | Admitting: Pharmacist

## 2017-06-12 DIAGNOSIS — Z5181 Encounter for therapeutic drug level monitoring: Secondary | ICD-10-CM | POA: Diagnosis not present

## 2017-06-12 DIAGNOSIS — I4891 Unspecified atrial fibrillation: Secondary | ICD-10-CM | POA: Diagnosis not present

## 2017-06-12 DIAGNOSIS — Z7901 Long term (current) use of anticoagulants: Secondary | ICD-10-CM

## 2017-06-12 LAB — POCT INR: INR: 2.1

## 2017-06-12 NOTE — Patient Instructions (Signed)
Description   Continue taking 1 tablet daily except 1.5 tablets on Tuesdays. Recheck in 4 weeks.  Call with any new medications or any scheduled procedures  [phone]

## 2017-07-15 ENCOUNTER — Ambulatory Visit (INDEPENDENT_AMBULATORY_CARE_PROVIDER_SITE_OTHER): Payer: Medicare Other | Admitting: *Deleted

## 2017-07-15 DIAGNOSIS — I4891 Unspecified atrial fibrillation: Secondary | ICD-10-CM | POA: Diagnosis not present

## 2017-07-15 DIAGNOSIS — Z7901 Long term (current) use of anticoagulants: Secondary | ICD-10-CM

## 2017-07-15 DIAGNOSIS — Z5181 Encounter for therapeutic drug level monitoring: Secondary | ICD-10-CM | POA: Diagnosis not present

## 2017-07-15 LAB — POCT INR: INR: 2.3

## 2017-07-15 NOTE — Patient Instructions (Signed)
Description   Continue taking 1 tablet daily except 1.5 tablets on Tuesdays. Recheck in 6 weeks. Call with any new medications or any scheduled procedures  [phone]     

## 2017-08-03 ENCOUNTER — Other Ambulatory Visit: Payer: Self-pay | Admitting: Pulmonary Disease

## 2017-08-26 ENCOUNTER — Ambulatory Visit (INDEPENDENT_AMBULATORY_CARE_PROVIDER_SITE_OTHER): Payer: Medicare Other | Admitting: *Deleted

## 2017-08-26 DIAGNOSIS — Z5181 Encounter for therapeutic drug level monitoring: Secondary | ICD-10-CM

## 2017-08-26 DIAGNOSIS — I4891 Unspecified atrial fibrillation: Secondary | ICD-10-CM | POA: Diagnosis not present

## 2017-08-26 DIAGNOSIS — Z7901 Long term (current) use of anticoagulants: Secondary | ICD-10-CM

## 2017-08-26 LAB — POCT INR: INR: 3.5

## 2017-08-26 NOTE — Patient Instructions (Signed)
Description   Do not take coumadin today March 18th then continue taking 1 tablet daily except 1.5 tablets on Tuesdays. Recheck in 2 weeks. Do extra serving of greens today then keep intake of greens consistent  Call with any new medications or any scheduled procedures  [phone]

## 2017-09-09 ENCOUNTER — Other Ambulatory Visit: Payer: Self-pay | Admitting: Pulmonary Disease

## 2017-09-11 ENCOUNTER — Ambulatory Visit (INDEPENDENT_AMBULATORY_CARE_PROVIDER_SITE_OTHER): Payer: Medicare Other | Admitting: *Deleted

## 2017-09-11 DIAGNOSIS — I4891 Unspecified atrial fibrillation: Secondary | ICD-10-CM

## 2017-09-11 DIAGNOSIS — Z5181 Encounter for therapeutic drug level monitoring: Secondary | ICD-10-CM | POA: Diagnosis not present

## 2017-09-11 DIAGNOSIS — Z7901 Long term (current) use of anticoagulants: Secondary | ICD-10-CM | POA: Diagnosis not present

## 2017-09-11 LAB — POCT INR: INR: 2.2

## 2017-09-11 NOTE — Patient Instructions (Signed)
Description   Continue taking 1 tablet daily except 1.5 tablets on Tuesdays. Recheck in 3 weeks. Keep intake of greens consistent  Call with any new medications or any scheduled procedures  [phone]

## 2017-09-26 ENCOUNTER — Encounter: Payer: Self-pay | Admitting: Pulmonary Disease

## 2017-09-26 ENCOUNTER — Other Ambulatory Visit (INDEPENDENT_AMBULATORY_CARE_PROVIDER_SITE_OTHER): Payer: Medicare Other

## 2017-09-26 ENCOUNTER — Ambulatory Visit: Payer: Medicare Other | Admitting: Pulmonary Disease

## 2017-09-26 ENCOUNTER — Ambulatory Visit (INDEPENDENT_AMBULATORY_CARE_PROVIDER_SITE_OTHER)
Admission: RE | Admit: 2017-09-26 | Discharge: 2017-09-26 | Disposition: A | Discharge status: Home / Self Care | Payer: Medicare Other | Source: Ambulatory Visit | Attending: Pulmonary Disease | Admitting: Pulmonary Disease

## 2017-09-26 VITALS — BP 130/70 | HR 61 | Temp 97.9°F | Ht 63.0 in | Wt 139.6 lb

## 2017-09-26 DIAGNOSIS — I10 Essential (primary) hypertension: Secondary | ICD-10-CM | POA: Diagnosis not present

## 2017-09-26 DIAGNOSIS — R609 Edema, unspecified: Secondary | ICD-10-CM | POA: Diagnosis not present

## 2017-09-26 DIAGNOSIS — Z853 Personal history of malignant neoplasm of breast: Secondary | ICD-10-CM

## 2017-09-26 DIAGNOSIS — Z7901 Long term (current) use of anticoagulants: Secondary | ICD-10-CM

## 2017-09-26 DIAGNOSIS — I481 Persistent atrial fibrillation: Secondary | ICD-10-CM

## 2017-09-26 DIAGNOSIS — E559 Vitamin D deficiency, unspecified: Secondary | ICD-10-CM | POA: Diagnosis not present

## 2017-09-26 DIAGNOSIS — R0902 Hypoxemia: Secondary | ICD-10-CM

## 2017-09-26 DIAGNOSIS — I872 Venous insufficiency (chronic) (peripheral): Secondary | ICD-10-CM | POA: Diagnosis not present

## 2017-09-26 DIAGNOSIS — R413 Other amnesia: Secondary | ICD-10-CM

## 2017-09-26 DIAGNOSIS — I272 Pulmonary hypertension, unspecified: Secondary | ICD-10-CM | POA: Diagnosis not present

## 2017-09-26 DIAGNOSIS — I4819 Other persistent atrial fibrillation: Secondary | ICD-10-CM

## 2017-09-26 LAB — LIPID PANEL
Cholesterol: 126 mg/dL (ref 0–200)
HDL: 51.7 mg/dL (ref >39.00)
LDL Cholesterol: 64 mg/dL (ref 0–99)
NONHDL: 74.21
Total CHOL/HDL Ratio: 2
Triglycerides: 53 mg/dL (ref 0.0–149.0)
VLDL: 10.6 mg/dL (ref 0.0–40.0)

## 2017-09-26 LAB — COMPREHENSIVE METABOLIC PANEL
ALT: 14 U/L (ref 0–35)
AST: 25 U/L (ref 0–37)
Albumin: 3.8 g/dL (ref 3.5–5.2)
Alkaline Phosphatase: 71 U/L (ref 39–117)
BUN: 22 mg/dL (ref 6–23)
CO2: 33 meq/L — AB (ref 19–32)
Calcium: 9.3 mg/dL (ref 8.4–10.5)
Chloride: 104 mEq/L (ref 96–112)
Creatinine, Ser: 0.91 mg/dL (ref 0.40–1.20)
GFR: 74.73 mL/min (ref >60.00)
GLUCOSE: 100 mg/dL — AB (ref 70–99)
Potassium: 3.5 mEq/L (ref 3.5–5.1)
SODIUM: 142 meq/L (ref 135–145)
Total Bilirubin: 0.9 mg/dL (ref 0.2–1.2)
Total Protein: 7.1 g/dL (ref 6.0–8.3)

## 2017-09-26 LAB — CBC WITH DIFFERENTIAL/PLATELET
BASOS PCT: 0.9 % (ref 0.0–3.0)
Basophils Absolute: 0.1 10*3/uL (ref 0.0–0.1)
EOS PCT: 2.2 % (ref 0.0–5.0)
Eosinophils Absolute: 0.1 10*3/uL (ref 0.0–0.7)
HCT: 35.9 % — ABNORMAL LOW (ref 36.0–46.0)
Hemoglobin: 11.2 g/dL — ABNORMAL LOW (ref 12.0–15.0)
LYMPHS ABS: 0.8 10*3/uL (ref 0.7–4.0)
Lymphocytes Relative: 14 % (ref 12.0–46.0)
MCHC: 31.3 g/dL (ref 30.0–36.0)
MCV: 96.1 fl (ref 78.0–100.0)
MONOS PCT: 9.6 % (ref 3.0–12.0)
Monocytes Absolute: 0.6 10*3/uL (ref 0.1–1.0)
NEUTROS PCT: 73.3 % (ref 43.0–77.0)
Neutro Abs: 4.3 10*3/uL (ref 1.4–7.7)
Platelets: 166 10*3/uL (ref 150.0–400.0)
RBC: 3.74 Mil/uL — AB (ref 3.87–5.11)
RDW: 15.4 % (ref 11.5–15.5)
WBC: 5.9 10*3/uL (ref 4.0–10.5)

## 2017-09-26 LAB — TSH: TSH: 2.49 u[IU]/mL (ref 0.35–4.50)

## 2017-09-26 NOTE — Patient Instructions (Signed)
Today we updated your med list in our EPIC system...    Continue your current medications the same...  Today we checked a follow up CXR & your yearly blood work... We wil arrange for a follow up 2DEcho too...    We will contact you w/ the results when available...   Remember to eliminate salt/ sodium from your diet...  And stay as active as possible...  Call for any questions...  Let's plan a follow up visit in 45mo, sooner if needed for problems.Marland KitchenMarland Kitchen
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
## 2017-09-30 LAB — VITAMIN D 1,25 DIHYDROXY
VITAMIN D3 1, 25 (OH): 27 pg/mL
Vitamin D 1, 25 (OH)2 Total: 27 pg/mL (ref 18–72)
Vitamin D2 1, 25 (OH)2: <8 pg/mL

## 2017-10-02 ENCOUNTER — Ambulatory Visit (INDEPENDENT_AMBULATORY_CARE_PROVIDER_SITE_OTHER): Payer: Medicare Other | Admitting: *Deleted

## 2017-10-02 DIAGNOSIS — Z5181 Encounter for therapeutic drug level monitoring: Secondary | ICD-10-CM

## 2017-10-02 DIAGNOSIS — Z7901 Long term (current) use of anticoagulants: Secondary | ICD-10-CM

## 2017-10-02 DIAGNOSIS — I4891 Unspecified atrial fibrillation: Secondary | ICD-10-CM

## 2017-10-02 LAB — POCT INR: INR: 3.9

## 2017-10-02 NOTE — Patient Instructions (Signed)
Description   Do not take coumadin today April 24th then continue taking 1 tablet daily except 1.5 tablets on Tuesdays. Recheck in 2 weeks. Keep intake of greens consistent and have extra serving today  Call with any new medications or any scheduled procedures  [phone]

## 2017-10-09 DIAGNOSIS — E559 Vitamin D deficiency, unspecified: Secondary | ICD-10-CM | POA: Insufficient documentation

## 2017-10-14 ENCOUNTER — Other Ambulatory Visit: Payer: Self-pay

## 2017-10-14 ENCOUNTER — Ambulatory Visit (INDEPENDENT_AMBULATORY_CARE_PROVIDER_SITE_OTHER): Payer: Medicare Other | Admitting: *Deleted

## 2017-10-14 ENCOUNTER — Ambulatory Visit (HOSPITAL_COMMUNITY): Payer: Medicare Other | Attending: Cardiovascular Disease

## 2017-10-14 DIAGNOSIS — I272 Pulmonary hypertension, unspecified: Secondary | ICD-10-CM | POA: Insufficient documentation

## 2017-10-14 DIAGNOSIS — I10 Essential (primary) hypertension: Secondary | ICD-10-CM | POA: Diagnosis not present

## 2017-10-14 DIAGNOSIS — I4891 Unspecified atrial fibrillation: Secondary | ICD-10-CM | POA: Diagnosis not present

## 2017-10-14 DIAGNOSIS — Z853 Personal history of malignant neoplasm of breast: Secondary | ICD-10-CM | POA: Insufficient documentation

## 2017-10-14 DIAGNOSIS — I481 Persistent atrial fibrillation: Secondary | ICD-10-CM | POA: Diagnosis present

## 2017-10-14 DIAGNOSIS — I4819 Other persistent atrial fibrillation: Secondary | ICD-10-CM

## 2017-10-14 DIAGNOSIS — Z5181 Encounter for therapeutic drug level monitoring: Secondary | ICD-10-CM | POA: Diagnosis not present

## 2017-10-14 LAB — POCT INR: INR: 2.8

## 2017-11-02 ENCOUNTER — Other Ambulatory Visit: Payer: Self-pay | Admitting: Internal Medicine

## 2017-11-06 ENCOUNTER — Ambulatory Visit (INDEPENDENT_AMBULATORY_CARE_PROVIDER_SITE_OTHER): Payer: Medicare Other | Admitting: *Deleted

## 2017-11-06 DIAGNOSIS — Z5181 Encounter for therapeutic drug level monitoring: Secondary | ICD-10-CM

## 2017-11-06 DIAGNOSIS — I4891 Unspecified atrial fibrillation: Secondary | ICD-10-CM

## 2017-11-06 LAB — POCT INR: INR: 3 (ref 2.0–3.0)

## 2017-11-06 NOTE — Patient Instructions (Addendum)
Description   Continue taking 1 tablet daily except 1.5 tablets on Tuesdays. Recheck in 4 weeks. Have some dark greens today and remain consistent. Call with any new medications or any scheduled procedures  [phone]     

## 2017-11-11 ENCOUNTER — Other Ambulatory Visit: Payer: Self-pay | Admitting: Pulmonary Disease

## 2017-12-04 ENCOUNTER — Ambulatory Visit (INDEPENDENT_AMBULATORY_CARE_PROVIDER_SITE_OTHER): Payer: Medicare Other | Admitting: *Deleted

## 2017-12-04 DIAGNOSIS — I4891 Unspecified atrial fibrillation: Secondary | ICD-10-CM | POA: Diagnosis not present

## 2017-12-04 DIAGNOSIS — Z5181 Encounter for therapeutic drug level monitoring: Secondary | ICD-10-CM | POA: Diagnosis not present

## 2017-12-04 LAB — POCT INR: INR: 3 (ref 2.0–3.0)

## 2017-12-04 NOTE — Patient Instructions (Signed)
Description   Continue taking 1 tablet daily except 1.5 tablets on Tuesdays. Recheck in 4 weeks. Have some dark greens today and remain consistent. Call with any new medications or any scheduled procedures  [phone]

## 2017-12-07 ENCOUNTER — Other Ambulatory Visit: Payer: Self-pay | Admitting: Pulmonary Disease

## 2018-01-01 ENCOUNTER — Ambulatory Visit (INDEPENDENT_AMBULATORY_CARE_PROVIDER_SITE_OTHER): Payer: Medicare Other | Admitting: Pharmacist

## 2018-01-01 DIAGNOSIS — Z5181 Encounter for therapeutic drug level monitoring: Secondary | ICD-10-CM

## 2018-01-01 DIAGNOSIS — I4891 Unspecified atrial fibrillation: Secondary | ICD-10-CM | POA: Diagnosis not present

## 2018-01-01 LAB — POCT INR: INR: 2.4 (ref 2.0–3.0)

## 2018-01-01 NOTE — Patient Instructions (Signed)
Description   Continue taking 1 tablet daily except 1.5 tablets on Tuesdays. Recheck in 6 weeks. Have some dark greens today and remain consistent. Call with any new medications or any scheduled procedures  [phone]

## 2018-01-15 ENCOUNTER — Other Ambulatory Visit: Payer: Self-pay | Admitting: Pulmonary Disease

## 2018-01-15 ENCOUNTER — Other Ambulatory Visit: Payer: Self-pay | Admitting: Internal Medicine

## 2018-01-29 ENCOUNTER — Other Ambulatory Visit: Payer: Self-pay | Admitting: Pulmonary Disease

## 2018-02-12 ENCOUNTER — Ambulatory Visit (INDEPENDENT_AMBULATORY_CARE_PROVIDER_SITE_OTHER): Payer: Medicare Other | Admitting: Pharmacist

## 2018-02-12 DIAGNOSIS — I4891 Unspecified atrial fibrillation: Secondary | ICD-10-CM

## 2018-02-12 DIAGNOSIS — Z5181 Encounter for therapeutic drug level monitoring: Secondary | ICD-10-CM | POA: Diagnosis not present

## 2018-02-12 LAB — POCT INR: INR: 2.9 (ref 2.0–3.0)

## 2018-02-12 NOTE — Patient Instructions (Signed)
Description   Continue taking 1 tablet daily except 1.5 tablets on Tuesdays. Recheck in 6 weeks. Call with any new medications or any scheduled procedures  [phone]

## 2018-02-15 ENCOUNTER — Other Ambulatory Visit: Payer: Self-pay | Admitting: Pulmonary Disease

## 2018-02-24 ENCOUNTER — Other Ambulatory Visit: Payer: Self-pay | Admitting: Pulmonary Disease

## 2018-03-06 ENCOUNTER — Telehealth: Payer: Self-pay | Admitting: Pulmonary Disease

## 2018-03-06 NOTE — Telephone Encounter (Signed)
Called and spoke with Patient.  She is complaining of left lower leg pain that started at the beginning of the week.  She stated that some swelling has gone down in her leg, but her ankle has more swelling and soreness.  She denies hurting her leg or ankle on anything, or any redness. She stated she could not see anyone today, because she had no one to bring her, but she thought her son could bring her tomorrow afternoon. Appointment made with Wyn Quaker, NP, 03/07/18, at 2:15pm.  Nothing further at this time.

## 2018-03-07 ENCOUNTER — Other Ambulatory Visit (INDEPENDENT_AMBULATORY_CARE_PROVIDER_SITE_OTHER): Payer: Medicare Other

## 2018-03-07 ENCOUNTER — Ambulatory Visit (INDEPENDENT_AMBULATORY_CARE_PROVIDER_SITE_OTHER): Payer: Medicare Other | Admitting: Pharmacist Clinician (PhC)/ Clinical Pharmacy Specialist

## 2018-03-07 ENCOUNTER — Ambulatory Visit: Payer: Medicare Other | Admitting: Pulmonary Disease

## 2018-03-07 ENCOUNTER — Telehealth: Payer: Self-pay | Admitting: Pulmonary Disease

## 2018-03-07 ENCOUNTER — Encounter: Payer: Self-pay | Admitting: Pulmonary Disease

## 2018-03-07 VITALS — BP 116/72 | HR 67 | Ht 60.5 in | Wt 131.2 lb

## 2018-03-07 DIAGNOSIS — I4891 Unspecified atrial fibrillation: Secondary | ICD-10-CM

## 2018-03-07 DIAGNOSIS — L03116 Cellulitis of left lower limb: Secondary | ICD-10-CM

## 2018-03-07 DIAGNOSIS — Z5181 Encounter for therapeutic drug level monitoring: Secondary | ICD-10-CM

## 2018-03-07 LAB — COMPREHENSIVE METABOLIC PANEL
ALT: 9 U/L (ref 0–35)
AST: 19 U/L (ref 0–37)
Albumin: 3.8 g/dL (ref 3.5–5.2)
Alkaline Phosphatase: 68 U/L (ref 39–117)
BUN: 24 mg/dL — ABNORMAL HIGH (ref 6–23)
CO2: 36 meq/L — AB (ref 19–32)
CREATININE: 0.88 mg/dL (ref 0.40–1.20)
Calcium: 9.6 mg/dL (ref 8.4–10.5)
Chloride: 104 mEq/L (ref 96–112)
GFR: 77.6 mL/min (ref >60.00)
GLUCOSE: 94 mg/dL (ref 70–99)
Potassium: 3.7 mEq/L (ref 3.5–5.1)
Sodium: 144 mEq/L (ref 135–145)
Total Bilirubin: 1.3 mg/dL — ABNORMAL HIGH (ref 0.2–1.2)
Total Protein: 7.6 g/dL (ref 6.0–8.3)

## 2018-03-07 LAB — D-DIMER, QUANTITATIVE: D-Dimer, Quant: 0.78 mcg/mL FEU — ABNORMAL HIGH (ref <0.50)

## 2018-03-07 LAB — CBC WITH DIFFERENTIAL/PLATELET
BASOS PCT: 0.9 % (ref 0.0–3.0)
Basophils Absolute: 0 10*3/uL (ref 0.0–0.1)
EOS ABS: 0.1 10*3/uL (ref 0.0–0.7)
EOS PCT: 1.1 % (ref 0.0–5.0)
HCT: 34.1 % — ABNORMAL LOW (ref 36.0–46.0)
Hemoglobin: 10.8 g/dL — ABNORMAL LOW (ref 12.0–15.0)
LYMPHS PCT: 12.5 % (ref 12.0–46.0)
Lymphs Abs: 0.6 10*3/uL — ABNORMAL LOW (ref 0.7–4.0)
MCHC: 31.7 g/dL (ref 30.0–36.0)
MCV: 95.4 fl (ref 78.0–100.0)
MONO ABS: 0.6 10*3/uL (ref 0.1–1.0)
Monocytes Relative: 11.6 % (ref 3.0–12.0)
NEUTROS PCT: 73.9 % (ref 43.0–77.0)
Neutro Abs: 3.7 10*3/uL (ref 1.4–7.7)
PLATELETS: 162 10*3/uL (ref 150.0–400.0)
RBC: 3.58 Mil/uL — AB (ref 3.87–5.11)
RDW: 14.7 % (ref 11.5–15.5)
WBC: 5.1 10*3/uL (ref 4.0–10.5)

## 2018-03-07 LAB — PROTIME-INR
INR: 4.9 ratio — ABNORMAL HIGH (ref 0.8–1.0)
Prothrombin Time: 55.5 s (ref 9.6–13.1)

## 2018-03-07 MED ORDER — DOXYCYCLINE HYCLATE 100 MG PO TABS: 100.0000 mg | ORAL_TABLET | Freq: Two times a day (BID) | ORAL | 0 refills | Status: DC

## 2018-03-07 NOTE — Assessment & Plan Note (Signed)
Lab work today  We will start an antibiotic: Doxycycline >>> 1 100 mg tablet every 12 hours for 7 days >>>take with food  >>>wear sunscreen  >>> Follow-up with the Coumadin clinic   Follow-up with our office in 2 weeks after completion of therapy to ensure you are improving  I have a low suspicion of you having a blood clot in your legs but I will check blood work just in case  Continue to keep your leg elevated as you can Can apply warm towels to leg

## 2018-03-07 NOTE — Progress Notes (Signed)
@Patient  ID: Cheyenne [name], female    DOB: Jun 07, 1926, 84 y.o.   MRN: [id number]  Chief Complaint  Patient presents with  . Acute Visit    Lower extremity swelling    Referring provider: Noralee Space, MD  HPI:  64 female non smoker is followed in our office for pulmonary hypertension, chronic respiratory failure  PMH: Blood pressure, A. Fib (on Coumadin), and venous insufficiency and edema, dementia, degenerative joint disease, Hx of bilateral breast cancer, diverticulitis, IBS Smoker/ Smoking History: Never Smoker  Maintenance:  None  Pt of: Dr. Lenna Gilford   03/07/2018  - Visit   84 year old female patient presenting today for acute visit of left lower leg extremity swelling.  Patient reports that symptoms started to worsen 1 week ago.  The patient reports they have been adherent to taking all of their medications including there 80 mg of Lasix daily and Coumadin dose.  Patient denies any other symptoms.  Patient reports she is not short of breath.  Patient denies fevers, chills, chest pain.  Patient has bilateral lower extremity's applied, left stocking due to swelling.  Patient is present with her son who wanted her to be evaluated.   Tests:    09/27/17-chest x-ray- diffuse bilateral pulmonary interstitial prominence consistent with CHF  10/14/17-echocardiogram 60%, PAP pressure 63 - increased since 2013 echocardiogram  Chart Review:     Specialty Problems      Pulmonary Problems   ALLERGIC RHINITIS    Qualifier: Diagnosis of  By: Lenna Gilford MD, Deborra Medina          Allergies  Allergen Reactions  . Iohexol      Desc: IV History sheet from prior CT states IV Contrast allergy- 13 hour prep given.     Immunization History  Administered Date(s) Administered  . H1N1 06/07/2008  . Influenza Split 04/24/2011, 04/22/2012  . Influenza Whole 03/08/2009, 06/26/2010  . Influenza, High Dose Seasonal PF 03/12/2016, 03/13/2017  . Influenza,inj,Quad PF,6+ Mos 04/28/2013,  04/30/2014, 05/10/2015  . Pneumococcal Conjugate-13 11/09/2014  . Pneumococcal Polysaccharide-23 10/10/2009    Past Medical History:  Diagnosis Date  . Adenocarcinoma, breast ([place])    bilateral  . Allergic rhinitis   . Anxiety   . Borderline diabetes mellitus   . Diverticulosis   . DJD (degenerative joint disease)   . Headache(784.0)   . HTN (hypertension)   . Hypoxemia   . IBS (irritable bowel syndrome)   . Permanent atrial fibrillation ([place])   . Tubulovillous adenoma of colon 2006  . Venous insufficiency     Tobacco History: Social History   Tobacco Use  Smoking Status Never Smoker  Smokeless Tobacco Never Used   Counseling given: Not Answered  Continue not smoking  Outpatient Encounter Medications as of 03/07/2018  Medication Sig  . acetaZOLAMIDE (DIAMOX) 250 MG tablet TAKE 2 TABLETS BY MOUTH DAILY AT 4PM  . albuterol (PROVENTIL HFA;VENTOLIN HFA) 108 (90 BASE) MCG/ACT inhaler Inhale 1-2 puffs into the lungs every 6 (six) hours as needed for wheezing or shortness of breath.  . allopurinol (ZYLOPRIM) 300 MG tablet TAKE 1 TABLET BY MOUTH EVERY DAY  . Cholecalciferol (VITAMIN D3) 5000 units CAPS Take 1 capsule by mouth daily.  . furosemide (LASIX) 40 MG tablet TAKE 2 TABLET EVERY MORNING  . KLOR-CON M20 20 MEQ tablet TAKE 1 TABLET BY MOUTH TWICE A DAY  . loperamide (IMODIUM) 2 MG capsule Take 2 mg by mouth as needed for diarrhea or loose stools.   . Multiple Vitamin (  MULTIVITAMIN) capsule Take 1 capsule by mouth daily.    . Multiple Vitamins-Minerals (PRESERVISION AREDS PO) Take 1 tablet by mouth daily.  Marland Kitchen oxybutynin (DITROPAN-XL) 10 MG 24 hr tablet TAKE 1 TABLET EVERY DAY FOR BLADDER SPASMS  . propranolol (INDERAL) 80 MG tablet TAKE 1/2 TABLET BY MOUTH TWO TIMES DAILY  . quinapril (ACCUPRIL) 40 MG tablet TAKE 1 TABLET BY MOUTH EVERY DAY  . Simethicone 125 MG CAPS Take by mouth. As needed for bloat,gas pains  . warfarin (COUMADIN) 5 MG tablet TAKE AS DIRECTED BY  COUMADIN CLINIC  . doxycycline (VIBRA-TABS) 100 MG tablet Take 1 tablet (100 mg total) by mouth 2 (two) times daily.   No facility-administered encounter medications on file as of 03/07/2018.      Review of Systems  Review of Systems  Constitutional: Negative for chills, fatigue, fever and unexpected weight change.  HENT: Negative for congestion, ear pain, postnasal drip, sinus pressure and sinus pain.   Respiratory: Negative for cough, chest tightness, shortness of breath and wheezing.   Cardiovascular: Positive for leg swelling (Left leg swelling ). Negative for chest pain and palpitations.  Gastrointestinal: Negative for blood in stool, diarrhea, nausea and vomiting.  Genitourinary: Negative for dysuria, frequency and urgency.  Musculoskeletal: Negative for arthralgias.  Skin: Negative for color change.  Allergic/Immunologic: Negative for environmental allergies and food allergies.  Neurological: Negative for dizziness, light-headedness and headaches.  Psychiatric/Behavioral: Negative for dysphoric mood. The patient is not nervous/anxious.   All other systems reviewed and are negative.    Physical Exam  BP 116/72 (BP Location: Left Arm, Cuff Size: Normal)   Pulse 67   Ht 5' 0.5" (1.537 m)   Wt 131 lb 3.2 oz (59.5 kg)   SpO2 96%   BMI 25.20 kg/m   Wt Readings from Last 5 Encounters:  03/07/18 131 lb 3.2 oz (59.5 kg)  09/26/17 139 lb 9.6 oz (63.3 kg)  05/08/17 135 lb 3.2 oz (61.3 kg)  04/15/17 132 lb 6.4 oz (60.1 kg)  04/08/17 137 lb (62.1 kg)    Physical Exam  Constitutional: She is oriented to person, place, and time and well-developed, well-nourished, and in no distress. No distress.  HENT:  Head: Normocephalic and atraumatic.  Right Ear: Hearing, tympanic membrane, external ear and ear canal normal.  Left Ear: Hearing, tympanic membrane, external ear and ear canal normal.  Nose: Nose normal. Right sinus exhibits no maxillary sinus tenderness and no frontal sinus  tenderness. Left sinus exhibits no maxillary sinus tenderness and no frontal sinus tenderness.  Mouth/Throat: Uvula is midline and oropharynx is clear and moist. No oropharyngeal exudate.  Eyes: Pupils are equal, round, and reactive to light.  Neck: Normal range of motion. Neck supple. No JVD present.  Cardiovascular: Normal rate and normal heart sounds. A regularly irregular rhythm present.  Pulses:      Radial pulses are 2+ on the right side, and 2+ on the left side.       Dorsalis pedis pulses are 2+ on the right side, and 2+ on the left side.  Pulmonary/Chest: Effort normal and breath sounds normal. No accessory muscle usage. No respiratory distress. She has no decreased breath sounds. She has no wheezes. She has no rhonchi.  Musculoskeletal: Normal range of motion. She exhibits edema (4+ lower extremity swelling left leg).       Left knee: She exhibits swelling.       Left ankle: She exhibits swelling. She exhibits normal pulse (2+ ).  Lymphadenopathy:  She has no cervical adenopathy.  Neurological: She is alert and oriented to person, place, and time. Gait normal.  Skin: Skin is warm and dry. Rash noted. She is not diaphoretic. There is erythema.     Psychiatric: Mood, memory, affect and judgment normal. Her mood appears not anxious.  Nursing note and vitals reviewed.    Pretest probability of DVT (Wells score)  Clinical features: Active cancer (treatment ongoing or within the previous 6 months or palliative) -1 Paralysis, paralysis, or recent plaster immobilization of the lower extremities -1 Recently bedridden for more than 3 days or major surgery within 4 weeks -1 Localized tenderness along the distribution of the deep vein system -1 Entire leg swollen -1 Calf swelling by more than 3 cm when compared to the asymptomatic leg (measured below tibial tuberosity) -1 Pitting edema (greater and symptomatic leg) -1 Collateral superficial veins -1 Alternative diagnosis as likely or  more than likely that of a deep vein thrombosis    -2   High probability - 3 or greater Moderate probability -  1 or 2 Low probability - 0 or less  Modification  DVT likely-2 or greater DVT unlikely-1 or less  Score: 2   Lab Results:  CBC    Component Value Date/Time   WBC 5.1 03/07/2018 1531   RBC 3.58 (L) 03/07/2018 1531   HGB 10.8 (L) 03/07/2018 1531   HCT 34.1 (L) 03/07/2018 1531   PLT 162.0 03/07/2018 1531   MCV 95.4 03/07/2018 1531   MCH 31.0 08/29/2015 2232   MCHC 31.7 03/07/2018 1531   RDW 14.7 03/07/2018 1531   LYMPHSABS 0.6 (L) 03/07/2018 1531   MONOABS 0.6 03/07/2018 1531   EOSABS 0.1 03/07/2018 1531   BASOSABS 0.0 03/07/2018 1531    BMET    Component Value Date/Time   NA 144 03/07/2018 1531   K 3.7 03/07/2018 1531   CL 104 03/07/2018 1531   CO2 36 (H) 03/07/2018 1531   GLUCOSE 94 03/07/2018 1531   BUN 24 (H) 03/07/2018 1531   CREATININE 0.88 03/07/2018 1531   CALCIUM 9.6 03/07/2018 1531   GFRNONAA >60 08/29/2015 2232   GFRAA >60 08/29/2015 2232    BNP No results found for: BNP  ProBNP    Component Value Date/Time   PROBNP 393.0 (H) 09/10/2016 1327    Imaging: No results found.    Assessment & Plan:   Pleasant 84 year old patient seen office visit today.  Patient with left lower extremity cellulitis.  Will start patient on antibiotic doxycycline.  We will also get lab work today: CBC, INR/PT, proBNP, and d-dimer.  I discussed with patient as well as with son I think it is unlikely patient has DVT.  Will rule out DVT with blood work if blood work is elevated we can consider lower extremity Doppler.  Patient will need to follow-up with Coumadin clinic to discuss antibiotic prescription.  We will have patient follow-up in 2 weeks with our office to ensure her symptoms are improving.  Patient to continue to keep leg elevated, can continue to wear compression stockings, can apply warm towels to the site.  Cellulitis Lab work today  We will  start an antibiotic: Doxycycline >>> 1 100 mg tablet every 12 hours for 7 days >>>take with food  >>>wear sunscreen  >>> Follow-up with the Coumadin clinic   Follow-up with our office in 2 weeks after completion of therapy to ensure you are improving  I have a low suspicion of you having a blood clot  in your legs but I will check blood work just in case  Continue to keep your leg elevated as you can Can apply warm towels to leg   I have discussed this case with Dr. Lenna Gilford.  Dr. Lenna Gilford also assessed the patient and agrees with plan of care for treatment of cellulitis as well as lab work.  This appointment was 28 minutes along with her 50% of that time in direct face-to-face patient care, assessment, plan of care follow-up.   Lauraine Rinne, NP 03/07/2018

## 2018-03-07 NOTE — Telephone Encounter (Signed)
03/07/18 1651  We have received critical lab result from patient lab work today.  INR is 4.9.  PT 55.5.  I attempted to contact the patient and left a voicemail for her to not take her Coumadin until Monday and to follow-up with the Coumadin clinic.  I then contacted the patient's daughter [name] (DPR on file).  I was able to reach patient's daughter and discussed results with her.  I also informed daughter that patient will need to follow-up with Coumadin clinic for further clarification on Coumadin dose.  Patient's daughter acknowledges plan of care with no concerns.  Also make sure to inform patient that if she starts seeing signs of bleeding patient will need to go to emergency room.  Pt to also follow fall precautions.  I then was able to successfully contact the Coumadin clinic.  I notified them of the patient's INR result.  The Coumadin clinic informed me that they will contact the patient to discuss further Coumadin instructions and will make sure that they are following up with the patient.  See anticoagulation visit encounter on 03/07/2018 inpatient chart for further information.  Patient to keep same follow-up with our office.  Wyn Quaker, FNP [facility]

## 2018-03-07 NOTE — Patient Instructions (Addendum)
Lab work today  We will start an antibiotic: Doxycycline >>> 1 100 mg tablet every 12 hours for 7 days >>>take with food  >>>wear sunscreen  >>> Follow-up with the Coumadin clinic   Follow-up with our office in 2 weeks after completion of therapy to ensure you are improving  I have a low suspicion of you having a blood clot in your legs but I will check blood work just in case  Continue to keep your leg elevated as you can Can apply warm towels to leg     It is flu season:   >>>Remember to be washing your hands regularly, using hand sanitizer, be careful to use around herself with has contact with people who are sick will increase her chances of getting sick yourself. >>> Best ways to protect herself from the flu: Receive the yearly flu vaccine, practice good hand hygiene washing with soap and also using hand sanitizer when available, eat a nutritious meals, get adequate rest, hydrate appropriately    As of 04/14/2018 we will be moving! We will no longer be at our [place] location.   Our new address and phone number will be:  [address] [place], [place] [address] Telephone number: [phone]   Please contact the office if your symptoms worsen or you have concerns that you are not improving.   Thank you for choosing [facility] for your healthcare, and for allowing [place] to partner with you on your healthcare journey. I am thankful to be able to provide care to you today.   Wyn Quaker FNP-C      Cellulitis, Adult Cellulitis is a skin infection. The infected area is usually red and sore. This condition occurs most often in the arms and lower legs. It is very important to get treated for this condition. Follow these instructions at home:  Take over-the-counter and prescription medicines only as told by your doctor.  If you were prescribed an antibiotic medicine, take it as told by your doctor. Do not stop taking the antibiotic even if you start to feel  better.  Drink enough fluid to keep your pee (urine) clear or pale yellow.  Do not touch or rub the infected area.  Raise (elevate) the infected area above the level of your heart while you are sitting or lying down.  Place warm or cold wet cloths (warm or cold compresses) on the infected area. Do this as told by your doctor.  Keep all follow-up visits as told by your doctor. This is important. These visits let your doctor make sure your infection is not getting worse. Contact a doctor if:  You have a fever.  Your symptoms do not get better after 1-2 days of treatment.  Your bone or joint under the infected area starts to hurt after the skin has healed.  Your infection comes back. This can happen in the same area or another area.  You have a swollen bump in the infected area.  You have new symptoms.  You feel ill and also have muscle aches and pains. Get help right away if:  Your symptoms get worse.  You feel very sleepy.  You throw up (vomit) or have watery poop (diarrhea) for a long time.  There are red streaks coming from the infected area.  Your red area gets larger.  Your red area turns darker. This information is not intended to replace advice given to you by your health care provider. Make sure you discuss any  questions you have with your health care provider. Document Released: 11/14/2007 Document Revised: 11/03/2015 Document Reviewed: 04/06/2015 Elsevier Interactive Patient Education  2018 [employer].

## 2018-03-08 LAB — PRO B NATRIURETIC PEPTIDE: NT-Pro BNP: 3544 pg/mL — ABNORMAL HIGH (ref 0–738)

## 2018-03-10 ENCOUNTER — Telehealth: Payer: Self-pay | Admitting: *Deleted

## 2018-03-10 NOTE — Progress Notes (Signed)
LMTCB x1 on preferred phone number listed for patient.  

## 2018-03-10 NOTE — Telephone Encounter (Signed)
LMTCB x1 on preferred phone number listed for patient regarding if she followed up with her coumadin clinic and to see how patient is feeling after visiting out office.

## 2018-03-10 NOTE — Progress Notes (Signed)
Your lab work is come back.  D-dimer results are normal when you factor in age.  proBNP results are elevated.  This indicates that your holding onto some fluid.  You can take an extra 40 mg tablet of Lasix 6 hours after your scheduled 80 mg for the next 3 days.  For example you should take your 80 mg (two 40 mg tablet) in the morning as she previously do, take an additional 40 mg tablet 6 hours afterwards for the next 3 days.   Please ensure the patient has follow-up scheduled with the Coumadin clinic regarding her INR results.  Records show that Coumadin clinic is contacted patient on Friday.  Wyn Quaker FNP

## 2018-03-11 NOTE — Progress Notes (Signed)
Thank you for the follow-up with the patient.  No further changes are needed at this time.  Wyn Quaker FNP

## 2018-03-12 ENCOUNTER — Ambulatory Visit (INDEPENDENT_AMBULATORY_CARE_PROVIDER_SITE_OTHER): Payer: Medicare Other | Admitting: *Deleted

## 2018-03-12 DIAGNOSIS — Z5181 Encounter for therapeutic drug level monitoring: Secondary | ICD-10-CM

## 2018-03-12 DIAGNOSIS — I4891 Unspecified atrial fibrillation: Secondary | ICD-10-CM | POA: Diagnosis not present

## 2018-03-12 LAB — POCT INR: INR: 1.6 — AB (ref 2.0–3.0)

## 2018-03-12 NOTE — Patient Instructions (Signed)
Description   Today take 1.5 tablets then continue with 1 tablet daily except 1.5 tablets each Tuesday.  Repeat INR Wednesday. Call with any new medications or any scheduled procedures  [phone]

## 2018-03-19 ENCOUNTER — Ambulatory Visit: Payer: Medicare Other | Admitting: *Deleted

## 2018-03-19 DIAGNOSIS — I4891 Unspecified atrial fibrillation: Secondary | ICD-10-CM | POA: Diagnosis not present

## 2018-03-19 DIAGNOSIS — Z5181 Encounter for therapeutic drug level monitoring: Secondary | ICD-10-CM | POA: Diagnosis not present

## 2018-03-19 LAB — POCT INR: INR: 2.5 (ref 2.0–3.0)

## 2018-03-19 NOTE — Patient Instructions (Signed)
Description   Continue taking 1 tablet daily except 1.5 tablets each Tuesday.  Repeat INR in 2 weeks. Call with any new medications or any scheduled procedures  [phone]

## 2018-03-24 ENCOUNTER — Encounter: Payer: Self-pay | Admitting: Pulmonary Disease

## 2018-03-24 ENCOUNTER — Ambulatory Visit (INDEPENDENT_AMBULATORY_CARE_PROVIDER_SITE_OTHER): Payer: Medicare Other

## 2018-03-24 ENCOUNTER — Ambulatory Visit (INDEPENDENT_AMBULATORY_CARE_PROVIDER_SITE_OTHER): Payer: Medicare Other | Admitting: Pulmonary Disease

## 2018-03-24 VITALS — BP 118/64 | HR 98 | Temp 97.5°F | Ht 60.5 in | Wt 131.4 lb

## 2018-03-24 DIAGNOSIS — I10 Essential (primary) hypertension: Secondary | ICD-10-CM

## 2018-03-24 DIAGNOSIS — Z23 Encounter for immunization: Secondary | ICD-10-CM

## 2018-03-24 DIAGNOSIS — I272 Pulmonary hypertension, unspecified: Secondary | ICD-10-CM | POA: Diagnosis not present

## 2018-03-24 DIAGNOSIS — I872 Venous insufficiency (chronic) (peripheral): Secondary | ICD-10-CM

## 2018-03-24 DIAGNOSIS — R0902 Hypoxemia: Secondary | ICD-10-CM

## 2018-03-24 DIAGNOSIS — Z853 Personal history of malignant neoplasm of breast: Secondary | ICD-10-CM

## 2018-03-24 DIAGNOSIS — Z7901 Long term (current) use of anticoagulants: Secondary | ICD-10-CM

## 2018-03-24 DIAGNOSIS — I4819 Other persistent atrial fibrillation: Secondary | ICD-10-CM

## 2018-03-24 DIAGNOSIS — E559 Vitamin D deficiency, unspecified: Secondary | ICD-10-CM

## 2018-03-24 DIAGNOSIS — R413 Other amnesia: Secondary | ICD-10-CM

## 2018-03-24 MED ORDER — KETOCONAZOLE 2 % EX CREA: 1.0000 "application " | TOPICAL_CREAM | Freq: Two times a day (BID) | CUTANEOUS | 1 refills | Status: AC

## 2018-03-24 MED ORDER — POTASSIUM CHLORIDE CRYS ER 20 MEQ PO TBCR: 20.0000 meq | EXTENDED_RELEASE_TABLET | Freq: Two times a day (BID) | ORAL | 1 refills | Status: AC

## 2018-03-24 NOTE — Progress Notes (Signed)
Subjective:    Patient ID: Cheyenne [name], female    DOB: Jan 05, 1926, 84 y.o.   MRN: [id number]  HPI 84 y/o BF here for a follow up visit... she has multiple medical problems as noted below>> ~  SEE PREV EPIC NOTES FOR OLDER DATA >>    LABS 11/13:  Chems- ok w/ K=3.7 CO2=34 BUN=23 Creat=0.9;  BNP=470...   CXR 5/14 showed cardiomeg, clear lungs, DJD spine, NAD...  LABS 5/14:  Chems- ok x K=3.4;  CBC- ok w/ Hg=12.3;  TSH=1.38;  VitD=31;  BNP=463...  CXR 5/15 showed mild cardiomeg, clear lungs, DJD spine, surg clips right axilla, NAD...  LABS 5/15:  Chems- ok x K=3.4 on K20/d & asked to incr back to Bid;    Ambulate on RA:  96% O2sat on RA at rest w/ HR=52;  87% on RA after 1lap w/ HR=109 => continue same O2 therapy...   LABS 11/15:  BMet- ok w/ K=4.1, HCO3=33, BUN=16, Cr=0.9.Marland Kitchen.  ~  Nov 03, 2014:  72mo ROV & Cheyenne [name] has had a stable interval- CC is itching on her back, no rash, no new meds etc; states her breathing is ok, DOE w/o change, no CP, min cough & Hycodan prn helps; weight is down 5# to 151# & less edema, reminded to elim all salt from her diet... We reviewed the following medical problems during today's office visit >>     Hypoxemia, PulmHTN> she has HomeO2 (not using it all the time), ProairHFA prn, & Hycodan cough syrup; she feels her breathing is at baseline; last 2DEcho 5/13 w/ PAsys~50 & stable...    HBP> on ASA81, Propran80-1/2Bid, Quinapril40, Lasix40-2Qam, Diamox250-2Qpm, & K20Bid; BP= 118/70 & she denies CP, palpit, ch in SOB, & decr edema w/ 5# wt loss.    AFib, SSS, Tachy-brady> on Coumadin (via CC); seen by DrKlein for Cards/EP in the past...    Venous Insuffic & edema> she had incr ankle edema & improved w/ elim sodium, elev legs, support hose & continue same diuretics...    DM> on diet alone; wt is down to 151# today; BS has been normal ~100...    GI- Divertics, IBS, Polyps> on Immodium prn; she saw DrStark 12/13> IBS, hx divertics & colon polyps, he did not rec  surveillance colonoscopy for her...    GU- bladder symptoms> she has freq & urge/stress incont- trial Oxybutynin10mg  & eval by Urology DrManny> rec changing diuretics if poss but it is not poss...    Hx bilat breast cancers> s/p mastectomies in 1996 & 2002, followed by [facility] & doing well w/o known recurrence...    DJD> on Allopurinol 300, Tramadol50; stable w/ mod DJD & limited mobility (knees, hips)...    Early dementia, anxiety> Aware- family helps w/ her meds & helps at home... We reviewed prob list, meds, xrays and labs> see below for updates >> she is due for PREVNAR-13 shot today...  CXR 5/16 showed mild cardiomeg, clear lungs, no effusion, NAD...   LABS 5/16>  Chems- wnl w/ HCO3=34, Cr=0.90;  CBC- wnl w/ Hg=12.1, Fe=65 (22%sat), Ferritin=113;  TSH=2.65...  IMP/PLAN >> Cheyenne [name] is reasonably stable at age 35- given Prevnar-42 today, meds refilled by request, same meds...   ~  May 10, 2015:  24mo ROV & Cheyenne [name] reports doing well- no new complaints or concerns; Epic review shows several calls for refill Hycodan cough syrup (says she uses it for sleep!=> rec to try TylenolPM & Delsym, not the Hydrocodon);  She says her breathing is improved, denies  SOB/ cough/ sput, CP, palpit, etc;  She uses her O2 w/ activity & she exercises w/ Silver Sneakers once per wk & exercise bike at home...    Hypoxemia, PulmHTN> she has HomeO2 (not using it now), ProairHFA prn, & prev Hycodan cough syrup; she feels her breathing is at baseline; last 2DEcho 5/13 w/ PAsys~50 & stable...    HBP> on ASA81, Propran80-1/2Bid, Accupril40, Lasix40-2Qam, Diamox250-2Qpm, & K20Bid; BP= 134/76 & she denies CP, palpit, ch in SOB, & decr edema w/ 5# wt loss.    AFib, SSS, Tachy-brady> on Coumadin (via CC); seen by DrKlein for Cards/EP in the past; denies CP, palpit, change in SOB/edema...    Venous Insuffic & edema> she had incr ankle edema & improved w/ elim sodium, elev legs, support hose & continue same diuretics...    DM>  on diet alone; wt is down to 144# today; BS has been normal ~100...    GI- Divertics, IBS, Polyps> on Immodium prn; she saw DrStark 12/13> IBS, hx divertics & colon polyps, he did not rec surveillance colonoscopy for her...    GU- bladder symptoms> she has freq & urge/stress incont- trial Oxybutynin10mg  & eval by Urology DrManny> rec changing diuretics if poss but it is not poss...    Hx bilat breast cancers> s/p mastectomies in 1996 & 2002, followed by [facility] & doing well w/o known recurrence...    DJD> on Allopurinol 300, Tramadol50, VitD2000; stable w/ mod DJD & limited mobility (knees, hips); she saw DrZSmith for neck pain, DDD, musc spasm, decr ROM- given exercises, ice/heat, Pennsaid, Tylenol, VitD rx.    Early dementia, anxiety> Aware- family helps w/ her meds & helps at home... EXAM shows Afeb, VSS, O2sat=96% on RA at rest;  Heent- neg, mallampati2;  Chest- clear w/o w/r/r; Heart- RR gr1/6 w/o r/g; Abd- soft, nontender, neg; Ext- VI, trace edema; Neuro- memory prob, no focal abn... IMP/PLAN>>  Cheyenne [name] is stable, same meds, OK 2016 Flu vaccine today, we plan ROV 50mo...   ~  September 02, 2015:  61mo ROV & add-on appt requested post ER visit> Cheyenne [name] went to the ER 08/29/15 w/ a "bump" on her vagina- it had been many yrs since she saw a Gyn, note made of her AFib hx and coumadin clinic rx; they noted bruising in her lower abd wall towards the left side & in her vaginal area- initially w/ some mild discomfort but no severe pain or bleeding; she denied any injury but later mentioned her exercise bike w/ uncomfortable seat that comes to a point near her pubis (she rides for 1H nightly while watching [employer]!);  She was evaluated by DrZammit including EXAM that showed neg abd exam x ecchymosis over lower left abd wall and mons pubis, +hematoma described in left labial fold, remainder of exam neg;  LABS showed Hg=11.5, Protime=23.9/ INR=2.15, CT Abd&Pelvis showed 5.2 x 2.7 cm intermediate soft tissue  density in the left perineum deep to the labia suggesting a hematoma;  Coumadin was held & she was asked to follow up w/ Gyn regarding the hematoma & here regarding the coumadin rx...    She saw Dr. Linda Hedges w/ vulvar hematoma resolving on conservative management...    She has held the Coumadin since 3/20, not riding the bike since then either, and she notes that the bruising is slowly diminishing, the "knot" seems to be getting smaller, bruising about the same- she denies any pain at present;  Plan is to continue off Coumadin until her Gyn  check on 09/05/15, and as long as the deep hematoma appears to be resolving then we will resume her anticoag on a sl lower dose to start... EXAM shows Afeb, VSS, O2sat=97% on 2L/min pulse dose;  HEENT- neg, mallampati2;  Chest- clear w/o w/r/r;  Heart- irreg Afib, rate60, gr1/6 SEM w/o r/g;  Abd- soft nontender, bruising in LLQ;  Ext= VI, trace edema;  Neuro- nonfocal... IMP/PLAN>>  Perineum hematoma likely from exercise bike seat trauma + her Coumadin therapy for AFib;  She appears stable off the Coumadin for the past 4d & notes that there is no pain & the "knot" appears to be getting smaller;  Plan is to remain off Coumadin until her Gyn check on 3/27 & if appears satis then we will rec resuming her anticoag rx at that time...   ~  Nov 09, 2015:  63mo ROV & pulmonary/medical recheck>  Her pelvic hematoma has resolved and she is back on Coumadin- followed closely in the CC, labs reviewed;  She has noted some swelling in feet & ankles, incr DOE, some orthopnea;  She is on Lasix40-2Qam + KCl Bid but she is NOT restricting dietary sodium...     EXAM shows Afeb, VSS, O2sat=92% on 2L/min pulse dose;  HEENT- neg, mallampati2;  Chest- clear w/o w/r/r;  Heart- irreg Afib, rate60, gr1/6 SEM w/o r/g;  Abd- soft nontender, bruising in LLQ;  Ext= VI, 2+ edema... IMP/PLAN>>  Cheyenne [name] is generally stable but has incr edema & needs NO SALT diet, continue Lasix80Qam;  She wants to  restart exercise bike & has new seat- so- OK... we plan ROV in 54mo w/ labs etc...  ~  March 12, 2016:  81mo ROV & Cheyenne [name] reports doing well- no new complaints or concerns;  Her breathing is good, uses her port O2 just as needed, notes swelling is diminished  & she's using the support hose    Hypoxemia, PulmHTN> she has HomeO2 (using it prn), ProairHFA prn, & prev Hycodan cough syrup; she feels her breathing is at baseline; last 2DEcho 5/13 w/ PAsys~50 & stable...    HBP> on ASA81, Propran80-1/2Bid, Accupril40, Lasix40-2Qam, Diamox250-2Qpm, & K20Bid; BP= 124/78 & she denies CP, palpit, ch in SOB, & decr edema w/ 5# wt loss; K~3.5-4.1, BWI2~03-55    AFib, SSS, Tachy-brady> on Coumadin (via CC); seen by DrKlein for Cards/EP in the past; denies CP, palpit, change in SOB/edema...    Venous Insuffic & edema> she had incr ankle edema & improved w/ elim sodium, elev legs, support hose & continue same diuretics...    DM> on diet alone; wt is down to 144# today; BS has been normal ~100...    GI- Divertics, IBS, Polyps> on Immodium prn; she saw DrStark 12/13> IBS, hx divertics & colon polyps, he did not rec surveillance colonoscopy for her...    GU- bladder symptoms> she has freq & urge/stress incont- trial Oxybutynin10mg  & eval by Urology DrManny> rec changing diuretics if poss but it is not poss...    Hx bilat breast cancers> s/p mastectomies in 1996 & 2002, followed by [facility] & doing well w/o known recurrence...    DJD> on Allopurinol 300, Tramadol50, VitD2000; stable w/ mod DJD & limited mobility (knees, hips); she saw DrZSmith for neck pain, DDD, musc spasm, decr ROM- given exercises, ice/heat, Pennsaid, Tylenol, VitD rx.    Early dementia, anxiety> Aware- family helps w/ her meds & helps at home... EXAM shows Afeb, VSS, O2sat=96% on RA at rest;  Heent- neg, mallampati2;  Chest-  clear w/o w/r/r; Heart- RR gr1/6 w/o r/g; Abd- soft, nontender, neg; Ext- VI, 1+edema; Neuro- memory prob, no focal  abn... IMP/PLAN>>  Shakyra is stable, continue same meds, OK Flu shot today...  ~  September 10, 2016:  55mo ROV & Cheyenne [name] reports a quiet interval- breathing well, no new complaints or concerns;  She has portable O2 that she uses w/ activity & exertion but takes it off at rest w/ O2sats in the 90s;  She denies CP, palpit, dizzy, etc but she has VI & +pitting edema despite low sodium diet, support hose, elevation and Demadex/ Diamox diuretic regimen... Problem list reviewed, doing satis & no active issues...     EXAM shows Afeb, VSS, O2sat=99% on RA at rest;  Heent- neg, mallampati2;  Chest- clear w/o w/r/r; Heart- RR gr1/6 w/o r/g; Abd- soft, nontender, neg; Ext- VI, 1+edema; Neuro- memory prob, no focal abn...  LABS 09/10/16>  Chems- ok w/ K=3.9, HCO3=33, BS=95, Cr=0.98;  CBC- wnl w/ Hg=12.2;  TSH=2.79;  BNP=393 IMP/PLAN>>  Cheyenne [name] is remarkably stable given her medical issues;  Rec to continue same meds & call for any problems...  ~  December 18, 2016:  25mo ROV & add-on appt requested for bruising on left hand>  Pt tells me that about 1wk ago she noticed a knot on the dorsum of her left hand, then developed a bruise that extended down her fingers & covered the back of her hand; she denies known trauma but the knot sounds like a small hematoma=> ecchmosis;  She denies pain, warmth, drainage, etc;  Recall hx vulvar hematoma 08/2015 due to trauma from exercise bike seat, it resolved w/ conservative measures;  She is on Coumadin for AFib & her last 3 protimes showed INR 2.2-2.3, but late last week it was 3.5 & dose decreased by the clinic staff; pt/ daugh report otherw she is stable- doing satis...    EXAM shows Afeb, VSS, O2sat=96% on RA at rest;  Wt decr to 134#;  Heent- neg, mallampati2;  Chest- clear w/o w/r/r; Heart- RR gr1/6 w/o r/g; Abd- soft, nontender, neg; Ext- VI, 1+edema; Neuro- memory prob, no focal abn...  LAB 12/13/16>  Coumadin clinic PT/INR=3.5 and her dose was decr to 1x5d=MWThFSu, and 1.5tabs  x2d=TSa... IMP/PLAN>>  She has had another mild bleeding episode- this one an ecchymosis on her left hand, no known trauma; she is asymptomatic- discussed cool compresses etc if needed; Coumadin clinic will watch her carefully & I have asked her to maintain her regular f/u appts...   ~  March 13, 2017:  42mo ROV & Cheyenne [name] is stable "feeling OK" she says but c/o back itching and occas dizziness;  No further bleeding episodes- followed carefully by CC;  She notes that memory issues frustrate her & her daughter "I just keep losing stuff" (they decline offer for Aricept medication)... We reviewed the following medical problems during today's office visit >>     Hypoxemia, PulmHTN> she has HomeO2 (using it prn), ProairHFA prn, & prev Hycodan cough syrup; she feels her breathing is at baseline- denies cough, sputum, CP, etc; last 2DEcho 5/13 w/ PAsys~49 & stable...    HBP> on ASA81, Propran80-1/2Bid, Accupril40, Lasix40-2Qam, Diamox250-2Qpm, & K20Bid; BP= 126/80 & she denies CP, palpit, ch in SOB, & decr edema; K~3.9-4.7, OHY0~73-71    AFib, SSS, Tachy-brady> on Coumadin (via CC); seen by DrKlein for Cards/EP in the past; denies CP, palpit, change in SOB/edema; 2DEcho 5/13 showed norm LVF w/ EF=55%, mild MR&AI.Marland KitchenMarland Kitchen  Venous Insuffic & edema> she had incr ankle edema & improved w/ elim sodium, elev legs, support hose & continue same diuretics...    DM> on diet alone; wt is down to 136# today; BS has been normal ~100...    GI- Divertics, IBS, Polyps> on Imodium prn; she saw DrStark 12/13> IBS, hx divertics & colon polyps, he did not rec surveillance colonoscopy for her...    GU- bladder symptoms> she has freq & urge/stress incont- trial Oxybutynin10mg  & eval by Urology DrManny> rec changing diuretics if poss but it is not poss...    Hx bilat breast cancers> s/p mastectomies in 1996 & 2002, followed by [facility] & doing well w/o known recurrence...    DJD> on Allopurinol 300, Tramadol50, VitD2000; stable w/ mod  DJD & limited mobility (knees, hips); she saw DrZSmith for neck pain, DDD, musc spasm, decr ROM- given exercises, ice/heat, Pennsaid, Tylenol, VitD rx.    Early dementia, anxiety> Aware- family helps w/ her meds & helps at home... EXAM shows Afeb, VSS, O2sat=100% on 3L/min pulse-dose at rest;  Heent- neg, mallampati2;  Chest- clear w/o w/r/r; Heart- RR gr1/6 w/o r/g; Abd- soft, nontender, neg; Ext- VI, 1+edema; Neuro- memory prob, no focal abn... IMP/PLAN>>  Cheyenne [name] is stable- no further bleeding & CC is watching protimes closely;  OK FLU shot today;  She declines to start Aricept etc;  rec to continue same meds...   ~  May 08, 2017:  34mo ROV & here for 3wk rov after being seen by TP on 04/15/17 for cellulitis in right foot-- she reports much improved s/p Keflex rx, no further pain, redness resolved, and she feels back to baseline; she has known VI & some dependent edema, supposed to be on Low salt diet, Lasix40-2Qam & Diamox250-2Qpm, along w/ KCl 20bid... See prob list above, reviewed...  We reviewed the following interval Epic notes>      She saw CARDS-DrAllred 04/08/17> f/u HBP, HxSSS, AFib on Coumadin, valvular heart dis w/ mild MR&AI, VI, edema- EKG w/ AFib, rate73, NSSTTW changes;  BP stable on Inderal, Accupril, Lasix/ Diamox/ KCl; told she could stop ASA & to continue Coumadin, f/u 61yr...    She saw TParrett,NP 04/15/17> acute visit for red swollen right foot, sl tender; denied CP/ SOB f/c/s; no known injury; Exam showed dorsum right foot red & warm, sl tender; Dx w/ celluliitis 7 treated w/ Keflex=> 3wk rov now. EXAM shows Afeb, VSS, O2sat=96% on 3L/min pulse-dose at rest;  Heent- neg, mallampati2;  Chest- clear w/o w/r/r; Heart- RR gr1/6 w/o r/g; Abd- soft, nontender, neg; Ext- VI, 1+edema, no erythema; Neuro- memory prob, no focality. IMP/PLAN>>  Right foot cellulitis resolved, she has underlying VI/ edema, and problems as noted; rec to continue same meds & keep prev sched rov in April2019...    ~  September 26, 2017:  12mo ROV & Cheyenne [name] reports the prev R-foot cellulitis has resolved w/ the Keflex & she is back to baseline;  She is here w/ her Daughter- [name] [phone];  Pt reports feeling well overall, states that her breathing is good & she denies cough, sput, SOB, CP, etc; she is too sedentary, not exercising & we discussed the benefits of Silver Sneakers, etc ([place] [facility] [place]/ walks/ and manages satis)...  We reviewed the following medical problems during today's office visit>     Hypoxemia, PulmHTN> she has HomeO2 (using it prn), ProairHFA prn, & prev Hycodan cough syrup; she feels  her breathing is at baseline- denies cough, sputum, CP, etc; last 2DEcho 5/13 w/ PAsys~49 & stable...    HBP> on Propran80-1/2Bid, Accupril40, Lasix40-2Qam, Diamox250-2Qpm, & K20Bid; BP= 130/70 & she denies CP, palpit, ch in SOB, & decr edema; K~3.9-4.7, GYJ8~56-31    AFib, SSS, Tachy-brady> on Coumadin (via CC); seen by DrKlein for Cards/EP in the past; denies CP, palpit, change in SOB/edema; 2DEcho 5/13 showed norm LVF w/ EF=55%, mild MR&AI.Marland KitchenMarland Kitchen    Venous Insuffic & edema> she had incr ankle edema & improved w/ elim sodium, elev legs, support hose & continue same diuretics...    DM> on diet alone; wt is 140# today; BS has been normal ~100...    GI- Divertics, IBS, Polyps> on Imodium prn; she saw DrStark 12/13> IBS, hx divertics & colon polyps, he did not rec surveillance colonoscopy for her...    GU- bladder symptoms> she has freq & urge/stress incont- trial Oxybutynin10mg  & eval by Urology DrManny> rec changing diuretics if poss but it is not poss...    Hx bilat breast cancers> s/p mastectomies in 1996 & 2002, followed by [facility] & doing well w/o known recurrence...    DJD> on Allopurinol 300, Tramadol50, VitD5000; stable w/ mod DJD & limited mobility (knees, hips); she saw DrZSmith for neck pain, DDD, musc spasm, decr ROM- given exercises, ice/heat,  Pennsaid, Tylenol, VitD rx.    Early dementia, anxiety> Aware- family helps w/ her meds & helps at home... EXAM shows Afeb, VSS, O2sat=99% on 3L/min pulse-dose at rest;  Heent- neg, mallampati2;  Chest- clear w/o w/r/r; Heart- RR gr1/6 w/o r/g; Abd- soft, nontender, neg; Ext- VI, 1+edema, no erythema; Neuro- memory prob, no focality.  CXR 09/26/17 (independently reviewed by me in the PACS system) shows cardiomegaly, sl incr markings but NAD & no effusions, etc...  LABS 09/26/17>  FLP- all parameters at goals on diet alone;  Chems- OK w/ HCO3=33, BS=100, Cr=0.91, LFTs wnl;  CBC- ok w/ mild anemia, Hg=11.2, mcv=96;  TSH=2.49;  VitD=27 (18-72) on 5000u daily- continue same...   2DEcho => done 10/14/17>  Norm LVF w/ EF=55-60%, NRWMA, could not eval LV diastolic function; mild AI, mildly thickened MV leaflets, LA-mod dil at 81mm, mod TR, PAsys=22mmHg. (REC- needs to wear her O2 regularly and NOT just prn)... IMP/PLAN>>  Cheyenne [name] is stable at 84 y/o w/ her multisystem dis on current med regimen;  Reminded to elim sodium from diet & increase exercise program;  CXR & labs are OK, f/u 2DEcho is pending=> PAsys=41mmHg. (REC- needs to wear her O2 regularly and NOT just prn)...   ~  March 24, 2018:  71mo ROV & general medical follow up visit>  Cheyenne [name] developed a left leg cellulitis 02/2018 & saw BMack,NP on 03/07/18 for an acute visit- note reviewed, Hx VI, LE edema, chr AFib on Coumadin; she denied f/c/s, no brkoen skin, no scratching/ excoriation, but LE was red/ inflammed/ warm & tender=> cellulitis diagnosed (CBC w/ Hg=10.8, WBC=5.1, BMet okm x HCO#=36 on her Diamox, Pro-BNP=393, Protime per coumadin clinic; treated w/ DOXY, elevation, soaks, etc...     She returns today w/ Daughter (care giver) and is improved- erythema gone, swelling diminished, no longer tender; persistent VI, trophic changes, edema; localized rash c/w tinea corporis=> Rx w/ Nizoral topically...  Medically she is stable, requests 2019 FLU  vax... We reviewed the following medical problems during today's office visit>     Hypoxemia, PulmHTN> she has HomeO2 (using it prn), ProairHFA prn, & prev Hycodan cough syrup; she feels her  breathing is at baseline- denies cough, sputum, CP, etc; last 2DEcho 5/13 w/ PAsys~49 & stable...    HBP> on Propran80-1/2Bid, Accupril40, Lasix40-2Qam, Diamox250-2Qpm, & K20Bid; BP= 118/64 & she denies CP, palpit, ch in SOB, & decr edema; K~3.9-4.7, RCV8~93-81    AFib, SSS, Tachy-brady> on Coumadin (via CC); seen by DrKlein for Cards/EP in the past; denies CP, palpit, change in SOB/edema; 2DEcho 5/13 showed norm LVF w/ EF=55%, mild MR&AI.Marland KitchenMarland Kitchen    Venous Insuffic, edema, LLE cellulitis 02/2018> had incr ankle edema & improved w/ elim sodium, elev legs, support hose & continue same diuretics (Lasix + Diamox); she presented w/ LLE cellulitis 02/2018 treated w/ DOXY + topical heat/ elev/ etc & improved.    Hx DM> on diet alone; wt is down to 131# today; BS has been normal ~100...    GI- Divertics, IBS, Polyps> on Imodium prn; she saw DrStark 12/13> IBS, hx divertics & colon polyps, he did not rec surveillance colonoscopy for her...    GU- bladder symptoms> she has freq & urge/stress incont- trial Oxybutynin10mg  & eval by Urology DrManny> rec changing diuretics if poss but it is not poss...    Hx bilat breast cancers> s/p mastectomies in 1996 & 2002, followed by [facility] & doing well w/o known recurrence...    DJD> on Allopurinol 300, VitD5000; stable w/ mod DJD & limited mobility (knees, hips); she saw DrZSmith for neck pain, DDD, musc spasm, decr ROM- given exercises, ice/heat, Pennsaid, Tylenol, VitD rx.    Early dementia, anxiety> Aware- family helps w/ her meds & helps at home... EXAM shows Afeb, VSS, O2sat=99% on RA at rest today (she has port O2 for ambulation);  Heent- neg, mallampati2;  Chest- clear w/o w/r/r; Heart- RR gr1/6 w/o r/g; Abd- soft, nontender, neg; Ext- VI, 3+edema left leg, no erythema; Neuro- memory  prob, no focality.  LABS 02/2018:  Chems- ok w/ K=3.7, HCO3=36, BS=74, Cr=0.88, LFTs wnl;  ProBNP=3544 (0-738);  D-Dimer= 0.78 (<0.50);  CBC- Hg=10.8, WBC=5.1 w/ 74%segs... protime per CC... IMP/PLAN>>              Problem List:  ALLERGIC RHINITIS (ICD-477.9) - on FLONASE Qhs & ZYRTEK QAM Prn.  Hx of HYPOXEMIA (ICD-799.02) - full eval for hypoxemia in [place] by 2DEcho ~  7/08:  CXR= cardiomegaly, ectatic Ao, pulm ven HTN... CTAngio= neg, without PE, min scarring in lingula... 2DEcho= norm LVF w/ EF=55%, LVwall thickness at upper limits, mild ca++ AoV w/ mild AI,  PA sys pressure = 68mmHg... O2 sats 90-94 at rest on RA... w/ ambulation in the office sats drop to 85%... she didn't yet want to go on oxygen at home... just occas SOB w/ activity- she is too sedentary and needs to incr her exercise program. ~  CTChest 6/09 showed bilat mastectomies, no adenopathy, no fluid, 2 sm nodules 4-31mm size RML/LLL, NAD... ~  6/10: c/o increased DOE & no energy- O2 sat= 91% rest & 86% w/activ... rec> home O2 1L/min rest, 2L/min exercise. ~  4/11 hosp> V/Q scan w/ normal vent & norm perfusion... CXR 4/11 showed cardiomeg, clear lungs, no CHF, surg clips in right axilla... ~  8/11:  pt requesting change to LiqO2 to incr mobility. ~  11/12:  O2 sat= 94% at rest on 2L/min oxygen via [place]... remains stable. ~  11/13:  O2 sat= 100% on 2L/min at rest... ~  CXR 5/14 showed cardiomeg, clear lungs, DJD spine, NAD ~  01/75:  O2 recert today> O2 sat on RA  at rest=96% w/ HR=65/min;  Ambulated 3 laps on RA w/ nadir O2 sat=88% w/ HR=116; Rec to continue O2 w/ exercise & Qhs... ~  CXR 5/15 showed mild cardiomeg, clear lungs, DJD spine, surg clips right axilla, NAD.Marland Kitchen. ~  5/15: Ambulate on RA:  96% O2sat on RA at rest w/ HR=52;  87% ob RA after 1lap w/ HR=109 => continue same O2 therapy ~  11/15: she remains on O2 at 2-3L/min flow; O2sats on 3L/min today = 98%... ~  5/16: she has port O2 at  home but states not using it now; O2sat=94% on RA at rest today...  ~  11/16: she remains on O2 at 2L/min pulse dose w/ exercise...  HYPERTENSION (ICD-401.9) - controlled on PROPRANOLOL 80mg Bid, QUINAPRIL 40mg /d; and back on LASIX 40mg AM, KCL 29mEqAM, & ACETAZOLAMIDE 250mg - 2tabsPM... she knows to avoid sodium etc... ~  labs 2/09 showed Na143, K3.6, CL101, CO2 36, BUN 15, Cr0.9.Marland KitchenMarland Kitchen BNP was 194... ~  labs 6/09 showed lytes normal x TCO2= 37, & BNP= 512... ~  labs 12/09 showed HCO3- 36,  BNP= 586...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

## 2018-03-24 NOTE — Patient Instructions (Signed)
Today we updated your med list in our EPIC system...    Continue your current medications the same...  Remember:  NO SALT, elev legs, wear support hose, continue current meds (& diuretics),,,  We wrote for a cream to use on the left leg rash- NIZORAL (Ketoconazole) apply twice daily...  We gave you the 2019 FLU vaccine today...  We discussed my up-coming retirement & the need for a new primary doctor...   I recommend a follow up appt in about 6 wks...  Lucillia,  It has been my great honor to have been one of your doctors over these many years...    Wishing you good health & happiness in the years to come!!!

## 2018-04-03 ENCOUNTER — Ambulatory Visit: Payer: Medicare Other | Admitting: *Deleted

## 2018-04-03 DIAGNOSIS — Z5181 Encounter for therapeutic drug level monitoring: Secondary | ICD-10-CM

## 2018-04-03 DIAGNOSIS — I4891 Unspecified atrial fibrillation: Secondary | ICD-10-CM | POA: Diagnosis not present

## 2018-04-03 LAB — POCT INR: INR: 2.4 (ref 2.0–3.0)

## 2018-04-03 NOTE — Patient Instructions (Signed)
Description   Continue taking 1 tablet daily except 1.5 tablets each Tuesday.  Repeat INR in 3 weeks. Call with any new medications or any scheduled procedures  [phone]

## 2018-05-02 ENCOUNTER — Ambulatory Visit: Payer: Medicare Other | Admitting: Pharmacist

## 2018-05-02 DIAGNOSIS — Z5181 Encounter for therapeutic drug level monitoring: Secondary | ICD-10-CM

## 2018-05-02 DIAGNOSIS — I4891 Unspecified atrial fibrillation: Secondary | ICD-10-CM

## 2018-05-02 LAB — POCT INR: INR: 3.1 — AB (ref 2.0–3.0)

## 2018-05-02 NOTE — Patient Instructions (Signed)
Description   Take 1/2 tablet today, then continue taking 1 tablet daily except 1.5 tablets each Tuesday.  Repeat INR in 4 weeks. Call with any new medications or any scheduled procedures  [phone]

## 2018-05-05 ENCOUNTER — Other Ambulatory Visit: Payer: Self-pay | Admitting: Pulmonary Disease

## 2018-05-05 ENCOUNTER — Other Ambulatory Visit: Payer: Self-pay | Admitting: Internal Medicine

## 2018-05-05 ENCOUNTER — Other Ambulatory Visit (INDEPENDENT_AMBULATORY_CARE_PROVIDER_SITE_OTHER): Payer: Medicare Other

## 2018-05-05 ENCOUNTER — Ambulatory Visit: Payer: Medicare Other | Admitting: Internal Medicine

## 2018-05-05 ENCOUNTER — Encounter: Payer: Self-pay | Admitting: Internal Medicine

## 2018-05-05 VITALS — BP 118/80 | HR 53 | Temp 98.4°F | Ht 60.5 in | Wt 131.0 lb

## 2018-05-05 DIAGNOSIS — M109 Gout, unspecified: Secondary | ICD-10-CM

## 2018-05-05 DIAGNOSIS — I272 Pulmonary hypertension, unspecified: Secondary | ICD-10-CM

## 2018-05-05 DIAGNOSIS — I872 Venous insufficiency (chronic) (peripheral): Secondary | ICD-10-CM

## 2018-05-05 DIAGNOSIS — I4819 Other persistent atrial fibrillation: Secondary | ICD-10-CM | POA: Diagnosis not present

## 2018-05-05 DIAGNOSIS — I10 Essential (primary) hypertension: Secondary | ICD-10-CM | POA: Diagnosis not present

## 2018-05-05 DIAGNOSIS — N393 Stress incontinence (female) (male): Secondary | ICD-10-CM

## 2018-05-05 DIAGNOSIS — R7301 Impaired fasting glucose: Secondary | ICD-10-CM | POA: Diagnosis not present

## 2018-05-05 DIAGNOSIS — K580 Irritable bowel syndrome with diarrhea: Secondary | ICD-10-CM

## 2018-05-05 LAB — HEMOGLOBIN A1C: HEMOGLOBIN A1C: 5.4 % (ref 4.6–6.5)

## 2018-05-05 LAB — URIC ACID: Uric Acid, Serum: 2.1 mg/dL — ABNORMAL LOW (ref 2.4–7.0)

## 2018-05-05 MED ORDER — ATENOLOL 25 MG PO TABS: 25.0000 mg | ORAL_TABLET | Freq: Every day | ORAL | 3 refills | Status: AC

## 2018-05-05 NOTE — Progress Notes (Signed)
   Subjective:    Patient ID: Cheyenne [name], female    DOB: Sep 21, 1925, 84 y.o.   MRN: [id number]  HPI The patient is a 84 YO female coming in new for several concerns including A fib (persistent, currently rate control with propranolol and coumadin for anticoagulation, denies excessive bleeding but does bruise easily, last INR within range, seeing cardiology yearly, does not appear they have ever talked to her about NOACs) and pulmonary hypertension (she is on oxygen at night time and with activity, she uses sometimes, she is taking diuretics as well, review of records does not show adequate documentation as to cause, there is no documented rheumatologic disease, no documented heart failure, does have A fib, treated with diuretics) and edema left leg (prior cellulitis in that leg within the last 3 months, no more redness, never really hurt, still swollen more than the right leg, denies salt in diet, taking her diuretics as prescribed), and her gout (taking allopurinol but gout not on problem list, she admits to one flare of gout a long time ago, denies having multiple episodes of gout, no recent uric acid in records), and impaired fasting glucose (prior notes mention diabetes but no HgA1c in recent times, also not on medications for that, is taking ACE-I), and IBS-D (using imodium several times per week to help with symptoms, denies problems, denies blood in stool, is satisfied with current treatment) and urinary incontinence (taking oxybutynin daily although they did not realize she was on anything for bladder, likely from her multiple diuretics, denies burning with urination). Lives independently for the most part.   PMH, [facility], social history reviewed and updated.   Review of Systems  Constitutional: Positive for fatigue. Negative for activity change, appetite change, fever and unexpected weight change.  HENT: Negative.   Eyes: Negative.   Respiratory: Negative for cough, chest tightness and  shortness of breath.   Cardiovascular: Negative for chest pain, palpitations and leg swelling.  Gastrointestinal: Positive for diarrhea. Negative for abdominal distention, abdominal pain, constipation, nausea and vomiting.  Genitourinary: Positive for frequency and urgency. Negative for difficulty urinating, dyspareunia, dysuria, enuresis and flank pain.  Musculoskeletal: Negative.  Negative for arthralgias, back pain, myalgias and neck pain.  Skin: Negative.   Neurological: Negative.   Psychiatric/Behavioral: Negative.       Objective:   Physical Exam  Constitutional: She is oriented to person, place, and time. She appears well-developed and well-nourished.  HENT:  Head: Normocephalic and atraumatic.  Eyes: EOM are normal.  Neck: Normal range of motion.  Cardiovascular: Normal rate.  irreg irreg  Pulmonary/Chest: Effort normal and breath sounds normal. No respiratory distress. She has no wheezes. She has no rales.  Abdominal: Soft. Bowel sounds are normal. She exhibits no distension. There is no tenderness. There is no rebound.  Musculoskeletal: She exhibits no edema.  Neurological: She is alert and oriented to person, place, and time. Coordination normal.  Skin: Skin is warm and dry.  Psychiatric: She has a normal mood and affect.   Vitals:   05/05/18 1258  BP: 118/80  Pulse: (!) 53  Temp: 98.4 F (36.9 C)  TempSrc: Oral  SpO2: 93%  Weight: 131 lb (59.4 kg)  Height: 5' 0.5" (1.537 m)      Assessment & Plan:

## 2018-05-05 NOTE — Patient Instructions (Signed)
We have sent in atenolol which is a heart rate medicine. This is 1 pill daily. This will replace propranolol (you currently take this 1/2 pill twice a day).   Stop propranolol when you run out and fill atenolol.   We are checking the labs today and will call you back about the results.  Think about changing the warfarin (coumadin) to a new blood clotting medicine called xarelto or eliquis. These are both new medicines that have steady levels. They do not need monitoring. They also do not interfere with dietary things like greens or foods so you do not need to watch your diet with this medicine. If you want to change let us know or also feel free to talk to the heart doctor about this as well.

## 2018-05-06 ENCOUNTER — Other Ambulatory Visit: Payer: Self-pay | Admitting: Internal Medicine

## 2018-05-06 DIAGNOSIS — M109 Gout, unspecified: Secondary | ICD-10-CM | POA: Insufficient documentation

## 2018-05-06 MED ORDER — ALLOPURINOL 100 MG PO TABS: 100.0000 mg | ORAL_TABLET | Freq: Every day | ORAL | 3 refills | Status: AC

## 2018-05-06 NOTE — Assessment & Plan Note (Signed)
Uses imodium several times per week for symptoms as needed. Has had prior workup without significant findings.

## 2018-05-06 NOTE — Assessment & Plan Note (Signed)
Needs HgA1c today and adjust as needed. No history of diabetes levels in the past.

## 2018-05-06 NOTE — Assessment & Plan Note (Signed)
Changing propranolol to atenolol. She denies tremors or anxiety although it is possible this is why this was started. She does have bradycardia in the past so a short acting beta blocker is not in her interest as it can cause low HR when taking medicine increasing risk of falls. Start atenolol 25 mg daily and adjust as needed. She is taking coumadin and does not like coumadin clinic checks. Counseled them about NOACs and advised that they think about this and talk to cardiology in the next month about possible switch.

## 2018-05-06 NOTE — Assessment & Plan Note (Signed)
BP at goal on propranolol and lasix and diamox and quinapril. Recent CMP okay. Will change propranolol to atenolol for more consistency in HR as she has noted bradycardia in past and would likely not benefit from a short acting beta blocker as well.

## 2018-05-06 NOTE — Assessment & Plan Note (Signed)
Taking diamox and lasix for this. Previous high BNP and echo within the last year consistent with overall normal heart function (diastolic heart function not assessed). They were unaware of this diagnosis. She does use oxygen some of the time.

## 2018-05-06 NOTE — Assessment & Plan Note (Signed)
Patient admits to 1 gout flare ever. Concern that she does not need to be on allopurinol. Will check uric acid and depending on result can decrease or stop possibly in the future.

## 2018-05-06 NOTE — Assessment & Plan Note (Signed)
Suspect in part due to diuretic regimen. She is taking oxybutynin 10 mg daily and we talked about possible side effects. They wish to continue for now.

## 2018-05-06 NOTE — Assessment & Plan Note (Signed)
Taking lasix 80 mg daily and diamox. Still 1-2+pitting edema bilateral left worse than right. Using compression stockings which help some as well.

## 2018-05-09 ENCOUNTER — Ambulatory Visit (INDEPENDENT_AMBULATORY_CARE_PROVIDER_SITE_OTHER): Payer: Medicare Other | Admitting: Family Medicine

## 2018-05-09 ENCOUNTER — Encounter: Payer: Self-pay | Admitting: Family Medicine

## 2018-05-09 VITALS — BP 140/80 | HR 58 | Temp 97.7°F | Resp 16 | Ht 60.5 in | Wt 132.5 lb

## 2018-05-09 DIAGNOSIS — W19XXXA Unspecified fall, initial encounter: Secondary | ICD-10-CM

## 2018-05-09 DIAGNOSIS — S0012XA Contusion of left eyelid and periocular area, initial encounter: Secondary | ICD-10-CM | POA: Diagnosis not present

## 2018-05-09 NOTE — Assessment & Plan Note (Signed)
Warm compresses  Tylenol for pain rto prn

## 2018-05-09 NOTE — Assessment & Plan Note (Signed)
Home health referral to evaluate her living space and make it safe This is the third fall this year She is now walking with a cane--- after watching her walk she probably needs a walker  Pt and daugher aware

## 2018-05-09 NOTE — Progress Notes (Signed)
Patient ID: Cheyenne [name], female    DOB: 28-Apr-1926  Age: 84 y.o. MRN: [id number]    Subjective:  Subjective  HPI Cheyenne [name] presents for f./u fall last Tuesday.  She did not tell her family until yesterday.  She slipped on area rug in bathroom that did not have rubber on the bottom and hit the side of her face L eye on toilet seat that had a cushoin on it  She does not think she passed out but there were not witnesses.  She was able to get up herself per her daugher  (who is with the pt).  She has bruise on her L eye, L hip and L hand.  No MS change per daugher.  No headache   Review of Systems  Constitutional: Negative for chills and fever.  HENT: Negative for congestion, facial swelling and hearing loss.   Eyes: Negative for discharge.       + L black eye  Respiratory: Negative for cough and shortness of breath.   Cardiovascular: Negative for chest pain, palpitations and leg swelling.  Gastrointestinal: Negative for abdominal pain, blood in stool, constipation, diarrhea, nausea and vomiting.  Genitourinary: Negative for dysuria, frequency, hematuria and urgency.  Musculoskeletal: Negative for back pain and myalgias.  Skin: Negative for rash.  Allergic/Immunologic: Negative for environmental allergies.  Neurological: Negative for dizziness, weakness and headaches.  Hematological: Does not bruise/bleed easily.  Psychiatric/Behavioral: Negative for suicidal ideas. The patient is not nervous/anxious.     History Past Medical History:  Diagnosis Date  . 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 Allergic rhinitis   . Anxiety   . Borderline diabetes mellitus   . Diverticulosis   . DJD (degenerative joint disease)   . Headache(784.0)   . HTN (hypertension)   . Hypoxemia   . IBS (irritable bowel syndrome)   . Permanent atrial fibrillation   . Tubulovillous adenoma of colon 2006  . Venous insufficiency     She has a past surgical history that includes DOUBLE  MASTECTOMY; Knee surgery (2004); Cataract extraction; Cholecystectomy; Mastectomy (1996); Mastectomy (1/02); Total knee arthroplasty (6/04); and Breast surgery.   Her family history includes Asthma in her brother; Breast cancer in her daughter; Cancer in her daughter and sister; Heart attack in her mother; Heart disease in her brother; Kidney cancer in her sister; Ulcers in her father.She reports that she has never smoked. She has never used smokeless tobacco. She reports that she does not drink alcohol or use drugs.  Current Outpatient Medications on File Prior to Visit  Medication Sig Dispense Refill  . acetaZOLAMIDE (DIAMOX) 250 MG tablet TAKE 2 TABLETS BY MOUTH DAILY AT 4PM 60 tablet 6  . albuterol (PROVENTIL HFA;VENTOLIN HFA) 108 (90 BASE) MCG/ACT inhaler Inhale 1-2 puffs into the lungs every 6 (six) hours as needed for wheezing or shortness of breath.    . allopurinol (ZYLOPRIM) 100 MG tablet Take 1 tablet (100 mg total) by mouth daily. 90 tablet 3  . atenolol (TENORMIN) 25 MG tablet Take 1 tablet (25 mg total) by mouth daily. 90 tablet 3  . Cholecalciferol (VITAMIN D3) 5000 units CAPS Take 1 capsule by mouth daily.    . furosemide (LASIX) 40 MG tablet TAKE 2 TABLET EVERY MORNING 180 tablet 1  . ketoconazole (NIZORAL) 2 % cream Apply 1 application topically 2 (two) times daily. 15 g 1  . loperamide (IMODIUM) 2 MG capsule Take 2 mg by mouth as needed for diarrhea or loose  stools.     . Multiple Vitamin (MULTIVITAMIN) capsule Take 1 capsule by mouth daily.      . Multiple Vitamins-Minerals (PRESERVISION AREDS PO) Take 1 tablet by mouth daily.    Marland Kitchen oxybutynin (DITROPAN-XL) 10 MG 24 hr tablet TAKE 1 TABLET EVERY DAY FOR BLADDER SPASMS 90 tablet 1  . potassium chloride SA (KLOR-CON M20) 20 MEQ tablet Take 1 tablet (20 mEq total) by mouth 2 (two) times daily. 180 tablet 1  . quinapril (ACCUPRIL) 40 MG tablet TAKE 1 TABLET BY MOUTH EVERY DAY 90 tablet 1  . Simethicone 125 MG CAPS Take by mouth. As  needed for bloat,gas pains    . warfarin (COUMADIN) 5 MG tablet TAKE AS DIRECTED BY COUMADIN CLINIC 120 tablet 1   No current facility-administered medications on file prior to visit.      Objective:  Objective  Physical Exam  Constitutional: She is oriented to person, place, and time. She appears well-developed and well-nourished.  HENT:  Head: Normocephalic. Head is with contusion. Head is without Battle's sign, without laceration, without right periorbital erythema and without left periorbital erythema.    Eyes: Pupils are equal, round, and reactive to light. Conjunctivae and EOM are normal. Left eye exhibits no chemosis, no discharge, no exudate and no hordeolum. No foreign body present in the left eye. Left eye exhibits normal extraocular motion.  Neck: Normal range of motion. Neck supple. No JVD present. Carotid bruit is not present. No thyromegaly present.  Cardiovascular: Normal rate, regular rhythm and normal heart sounds.  No murmur heard. Pulmonary/Chest: Effort normal and breath sounds normal. No respiratory distress. She has no wheezes. She has no rales. She exhibits no tenderness.  Musculoskeletal: She exhibits no edema.  Neurological: She is alert and oriented to person, place, and time.  Skin: Skin is warm. Bruising and ecchymosis noted. No rash noted.     Psychiatric: She has a normal mood and affect.  Nursing note and vitals reviewed.  BP 140/80 (BP Location: Left Arm, Patient Position: Sitting, Cuff Size: Normal)   Pulse (!) 58   Temp 97.7 F (36.5 C) (Oral)   Resp 16   Ht 5' 0.5" (1.537 m)   Wt 132 lb 8 oz (60.1 kg)   BMI 25.45 kg/m  Wt Readings from Last 3 Encounters:  05/09/18 132 lb 8 oz (60.1 kg)  05/05/18 131 lb (59.4 kg)  03/24/18 131 lb 6.4 oz (59.6 kg)     Lab Results  Component Value Date   WBC 5.1 03/07/2018   HGB 10.8 (L) 03/07/2018   HCT 34.1 (L) 03/07/2018   PLT 162.0 03/07/2018   GLUCOSE 94 03/07/2018   CHOL 126 09/26/2017   TRIG  53.0 09/26/2017   HDL 51.70 09/26/2017   LDLCALC 64 09/26/2017   ALT 9 03/07/2018   AST 19 03/07/2018   NA 144 03/07/2018   K 3.7 03/07/2018   CL 104 03/07/2018   CREATININE 0.88 03/07/2018   BUN 24 (H) 03/07/2018   CO2 36 (H) 03/07/2018   TSH 2.49 09/26/2017   INR 3.1 (A) 05/02/2018   HGBA1C 5.4 05/05/2018    Dg Chest 2 View  Result Date: 09/27/2017 CLINICAL DATA:  History of breast cancer. EXAM: CHEST - 2 VIEW COMPARISON:  Chest x-ray 11/03/2014. FINDINGS: Cardiomegaly with diffuse bilateral pulmonary interstitial prominence consistent with CHF. No pleural effusion or pneumothorax. Degenerative changes and scoliosis thoracic spine. Surgical clips right chest. IMPRESSION: Cardiomegaly with diffuse bilateral pulmonary interstitial prominence consistent with CHF. Electronically  Signed   By: Marcello Moores  Register   On: 09/27/2017 06:37     Assessment & Plan:  Plan  I am having Cheyenne [name] maintain her loperamide, multivitamin, albuterol, Vitamin D3, Multiple Vitamins-Minerals (PRESERVISION AREDS PO), Simethicone, oxybutynin, acetaZOLAMIDE, warfarin, quinapril, furosemide, ketoconazole, potassium chloride SA, atenolol, and allopurinol.  No orders of the defined types were placed in this encounter.   Problem List Items Addressed This Visit      Unprioritized   Black eye, left    Warm compresses  Tylenol for pain rto prn       Fall - Primary    Home health referral to evaluate her living space and make it safe This is the third fall this year She is now walking with a cane--- after watching her walk she probably needs a walker  Pt and daugher aware      Relevant Orders   Ambulatory referral to Home Health      Follow-up: Return if symptoms worsen or fail to improve.  Ann Held, DO

## 2018-05-09 NOTE — Patient Instructions (Signed)

## 2018-05-16 ENCOUNTER — Telehealth: Payer: Self-pay | Admitting: Internal Medicine

## 2018-05-16 NOTE — Telephone Encounter (Signed)
Copied from [place] [id number]. Topic: Quick Communication - Home Health Verbal Orders >> May 16, 2018  4:33 PM Bea Graff, NT wrote: Caller/Agency: Kathrine Cords Callback Number: [phone] Requesting OT/PT/Skilled Nursing/Social Work: PT Frequency: 1 time a week for 1 week, 2 times a week for 5 weeks for balance, transfers ,strength and fall prevention.

## 2018-05-19 ENCOUNTER — Ambulatory Visit: Payer: Self-pay

## 2018-05-19 NOTE — Telephone Encounter (Signed)
Shanon Brow with [employer] calling. States he has two interactions to report on pt's medication. States that they are required to get okay from provider when this happens. Interaction between oxybutynin (DITROPAN-XL) 10 MG 24 hr tablet and potassium chloride SA (KLOR-CON M20) 20 MEQ tablet - severity level 2 Interaction between allopurinol (ZYLOPRIM) 100 MG tablet and warfarin (COUMADIN) 5 MG tablet - severity level 2 Shanon Brow stated if the algorithm is a severity level 1 or 2, they are required to notify the PCP for approval to continues the medications or make adjustments.  Reason for Disposition . [1] Caller requesting NON-URGENT health information AND [2] PCP's office is the best resource  Answer Assessment - Initial Assessment Questions 1. REASON FOR CALL or QUESTION: "What is your reason for calling today?" or "How can I best help you?" or "What question do you have that I can help answer?"      Shanon Brow with [facility] calling. States he has two interactions to report on pt's medication. States that they are required to get okay from provider when this happens. Interaction between oxybutynin (DITROPAN-XL) 10 MG 24 hr tablet and potassium chloride SA (KLOR-CON M20) 20 MEQ tablet - severity level 2 Interaction between allopurinol (ZYLOPRIM) 100 MG tablet and warfarin (COUMADIN) 5 MG tablet - severity level 2  Protocols used: INFORMATION ONLY CALL-A-AH

## 2018-05-19 NOTE — Telephone Encounter (Signed)
Verbals Given  

## 2018-05-19 NOTE — Telephone Encounter (Signed)
These are fine

## 2018-05-19 NOTE — Telephone Encounter (Signed)
Fine

## 2018-05-19 NOTE — Telephone Encounter (Signed)
Shanon Brow informed of MD response

## 2018-05-20 ENCOUNTER — Other Ambulatory Visit: Payer: Self-pay | Admitting: Pulmonary Disease

## 2018-05-30 ENCOUNTER — Other Ambulatory Visit: Payer: Self-pay

## 2018-05-30 ENCOUNTER — Emergency Department (HOSPITAL_COMMUNITY): Payer: Medicare Other

## 2018-05-30 ENCOUNTER — Encounter (HOSPITAL_COMMUNITY): Payer: Self-pay

## 2018-05-30 ENCOUNTER — Emergency Department (HOSPITAL_COMMUNITY)
Admission: EM | Admit: 2018-05-30 | Discharge: 2018-05-30 | Disposition: A | Discharge status: Home / Self Care | Payer: Medicare Other | Attending: Emergency Medicine | Admitting: Emergency Medicine

## 2018-05-30 DIAGNOSIS — Z79899 Other long term (current) drug therapy: Secondary | ICD-10-CM | POA: Insufficient documentation

## 2018-05-30 DIAGNOSIS — I4891 Unspecified atrial fibrillation: Secondary | ICD-10-CM | POA: Diagnosis not present

## 2018-05-30 DIAGNOSIS — Z7901 Long term (current) use of anticoagulants: Secondary | ICD-10-CM | POA: Insufficient documentation

## 2018-05-30 DIAGNOSIS — R531 Weakness: Secondary | ICD-10-CM

## 2018-05-30 DIAGNOSIS — R29810 Facial weakness: Secondary | ICD-10-CM | POA: Insufficient documentation

## 2018-05-30 DIAGNOSIS — I509 Heart failure, unspecified: Secondary | ICD-10-CM | POA: Diagnosis not present

## 2018-05-30 DIAGNOSIS — Z853 Personal history of malignant neoplasm of breast: Secondary | ICD-10-CM | POA: Diagnosis not present

## 2018-05-30 DIAGNOSIS — I10 Essential (primary) hypertension: Secondary | ICD-10-CM | POA: Diagnosis not present

## 2018-05-30 DIAGNOSIS — R4781 Slurred speech: Secondary | ICD-10-CM | POA: Insufficient documentation

## 2018-05-30 LAB — DIFFERENTIAL
Abs Immature Granulocytes: 0.04 10*3/uL (ref 0.00–0.07)
Basophils Absolute: 0 10*3/uL (ref 0.0–0.1)
Basophils Relative: 1 %
Eosinophils Absolute: 0 10*3/uL (ref 0.0–0.5)
Eosinophils Relative: 1 %
Immature Granulocytes: 1 %
Lymphocytes Relative: 10 %
Lymphs Abs: 0.5 10*3/uL — ABNORMAL LOW (ref 0.7–4.0)
Monocytes Absolute: 0.4 10*3/uL (ref 0.1–1.0)
Monocytes Relative: 8 %
Neutro Abs: 4.2 10*3/uL (ref 1.7–7.7)
Neutrophils Relative %: 79 %

## 2018-05-30 LAB — CBC
HCT: 38.8 % (ref 36.0–46.0)
Hemoglobin: 10.9 g/dL — ABNORMAL LOW (ref 12.0–15.0)
MCH: 28.9 pg (ref 26.0–34.0)
MCHC: 28.1 g/dL — AB (ref 30.0–36.0)
MCV: 102.9 fL — ABNORMAL HIGH (ref 80.0–100.0)
Platelets: 139 10*3/uL — ABNORMAL LOW (ref 150–400)
RBC: 3.77 MIL/uL — ABNORMAL LOW (ref 3.87–5.11)
RDW: 15 % (ref 11.5–15.5)
WBC: 5.2 10*3/uL (ref 4.0–10.5)
nRBC: 0.4 % — ABNORMAL HIGH (ref 0.0–0.2)

## 2018-05-30 LAB — COMPREHENSIVE METABOLIC PANEL
ALK PHOS: 73 U/L (ref 38–126)
ALT: 23 U/L (ref 0–44)
AST: 34 U/L (ref 15–41)
Albumin: 3.6 g/dL (ref 3.5–5.0)
Anion gap: 8 (ref 5–15)
BUN: 27 mg/dL — ABNORMAL HIGH (ref 8–23)
CALCIUM: 9.4 mg/dL (ref 8.9–10.3)
CO2: 26 mmol/L (ref 22–32)
Chloride: 112 mmol/L — ABNORMAL HIGH (ref 98–111)
Creatinine, Ser: 0.93 mg/dL (ref 0.44–1.00)
GFR calc Af Amer: >60 mL/min (ref >60)
GFR, EST NON AFRICAN AMERICAN: 54 mL/min — AB (ref >60)
Glucose, Bld: 111 mg/dL — ABNORMAL HIGH (ref 70–99)
Potassium: 4.2 mmol/L (ref 3.5–5.1)
Sodium: 146 mmol/L — ABNORMAL HIGH (ref 135–145)
TOTAL PROTEIN: 7.5 g/dL (ref 6.5–8.1)
Total Bilirubin: 1.2 mg/dL (ref 0.3–1.2)

## 2018-05-30 LAB — I-STAT TROPONIN, ED: Troponin i, poc: 0.01 ng/mL (ref 0.00–0.08)

## 2018-05-30 LAB — ETHANOL

## 2018-05-30 LAB — PROTIME-INR
INR: 3.8
Prothrombin Time: 36.9 seconds — ABNORMAL HIGH (ref 11.4–15.2)

## 2018-05-30 LAB — APTT: aPTT: 41 seconds — ABNORMAL HIGH (ref 24–36)

## 2018-05-30 LAB — MAGNESIUM: Magnesium: 2.2 mg/dL (ref 1.7–2.4)

## 2018-05-30 LAB — TSH: TSH: 1.941 u[IU]/mL (ref 0.350–4.500)

## 2018-05-30 MED ORDER — LACTATED RINGERS IV BOLUS: 500.0000 mL | Freq: Once | INTRAVENOUS | Status: DC

## 2018-05-30 MED ORDER — LORAZEPAM 2 MG/ML IJ SOLN
0.5000 mg | Freq: Once | INTRAMUSCULAR | Status: AC
  Administered 2018-05-30: 0.5 mg via INTRAVENOUS
  Filled 2018-05-30: qty 1

## 2018-05-30 NOTE — ED Notes (Signed)
Pt returned from MRI °

## 2018-05-30 NOTE — ED Triage Notes (Signed)
Pt brought in by EMS due to right sided facial droop and slurred speech. LSN was 6pm yesterday and family member noticed the symptoms. Pt is a&ox3. Pt takes coumadin.

## 2018-05-30 NOTE — ED Notes (Signed)
Received call from MRI stating that pt could not tolerate MRI due to the noise.  Dr. Gilford Raid made aware.  Verbal order for Ativan 0.5mg  IV given

## 2018-05-30 NOTE — ED Provider Notes (Signed)
[facility] EMERGENCY DEPARTMENT Provider Note   CSN: [id number] Arrival date & time: 05/30/18  1427     History   Chief Complaint Chief Complaint  Patient presents with  . Stroke Symptoms    HPI Cheyenne [name] is a 84 y.o. female.  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  History is primarily obtained from the patient's son as the patient states she is not sure why she is in the emergency department denies any acute complaints including headache, weakness, chest pain, shortness of breath, abdominal pain, nausea, extremity paresthesias, or other acute complaints.  Per the patient's son he last saw the patient yesterday on 12/19 at approximately 6:30 PM and she was acting normally.  At approximately 1:30 PM today the patient son states he came to her house and felt that she was hard to arouse and she usually is but notes she frequently takes naps while sitting in her chair.  After arousing the patient with repeated verbal stimuli the patient was interactive but the patient's son noticed that the right side of her face appeared drooped and that she was slurring her words.  He states that he feels since coming to the emergency department the symptoms have resolved but she is still confused she is normally alert and oriented x4.  He denies any history of stroke but knows the patient is currently on Coumadin for A. fib.  Past Medical History:  Diagnosis Date  . Adenocarcinoma, breast ([place])    bilateral  . Allergic rhinitis   . Anxiety   . Borderline diabetes mellitus   . Diverticulosis   . DJD (degenerative joint disease)   . Headache(784.0)   . HTN (hypertension)   . Hypoxemia   . IBS  (irritable bowel syndrome)   . Permanent atrial fibrillation   . Tubulovillous adenoma of colon 2006  . Venous insufficiency     Patient Active Problem List   Diagnosis Date Noted  . Black eye, left 05/09/2018  . Fall 05/09/2018  . Gout 05/06/2018  . Memory loss 09/02/2015  . Degenerative cervical disc 12/10/2014  . Pulmonary hypertension ([place]) 04/28/2013  . Urinary incontinence 04/28/2013  . ATRIAL FIBRILLATION 09/02/2008  . ALLERGIC RHINITIS 11/24/2007  . ADENOCARCINOMA, BREAST, BILATERAL, HX OF 11/24/2007  . IFG (impaired fasting glucose) 07/22/2007  . Essential hypertension 04/08/2007  . Venous (peripheral) insufficiency 04/08/2007  . IRRITABLE BOWEL SYNDROME 04/08/2007  . Osteoarthritis 04/08/2007    Past Surgical History:  Procedure Laterality Date  . BREAST SURGERY    . CATARACT EXTRACTION    . CHOLECYSTECTOMY    . DOUBLE MASTECTOMY    . KNEE SURGERY  2004   RIGHT  . MASTECTOMY  1996   right breast for breast cancer  . MASTECTOMY  1/02   left - Dr Lucia Gaskins  . TOTAL KNEE ARTHROPLASTY  6/04   right - Dr Telford Nab     OB History   No obstetric history on file.      Home Medications    Prior to Admission medications   Medication Sig Start Date End Date Taking? Authorizing Provider  acetaZOLAMIDE (DIAMOX) 250 MG tablet TAKE 2 TABLETS BY MOUTH DAILY AT 4PM Patient taking differently: Take 500 mg by mouth daily.  12/09/17  Yes Noralee Space, MD  albuterol (PROVENTIL HFA;VENTOLIN HFA) 108 (90 BASE) MCG/ACT inhaler Inhale 1-2 puffs into the lungs every 6 (six) hours as needed for wheezing or shortness of breath. 04/22/12  Yes Noralee Space, MD  allopurinol (ZYLOPRIM) 100 MG tablet Take 1 tablet (100 mg total) by mouth daily. 05/06/18  Yes Hoyt Koch, MD  Cholecalciferol (VITAMIN D3) 5000 units CAPS Take 1 capsule by mouth daily.   Yes [provider]  furosemide (LASIX) 40 MG tablet TAKE 2 TABLET EVERY MORNING Patient taking differently: Take 80  mg by mouth daily.  02/17/18  Yes Noralee Space, MD  ketoconazole (NIZORAL) 2 % cream Apply 1 application topically 2 (two) times daily. 03/24/18  Yes Noralee Space, MD  loperamide (IMODIUM) 2 MG capsule Take 2 mg by mouth as needed for diarrhea or loose stools.    Yes [provider]  Multiple Vitamin (MULTIVITAMIN) capsule Take 1 capsule by mouth daily.     Yes [provider]  oxybutynin (DITROPAN-XL) 10 MG 24 hr tablet TAKE 1 TABLET EVERY DAY FOR BLADDER SPASMS 05/21/18  Yes Noralee Space, MD  potassium chloride SA (KLOR-CON M20) 20 MEQ tablet Take 1 tablet (20 mEq total) by mouth 2 (two) times daily. 03/24/18  Yes Noralee Space, MD  quinapril (ACCUPRIL) 40 MG tablet TAKE 1 TABLET BY MOUTH EVERY DAY Patient taking differently: Take 40 mg by mouth daily.  01/29/18  Yes Noralee Space, MD  atenolol (TENORMIN) 25 MG tablet Take 1 tablet (25 mg total) by mouth daily. 05/05/18   Hoyt Koch, MD  Simethicone 125 MG CAPS Take by mouth. As needed for bloat,gas pains    [provider]  warfarin (COUMADIN) 5 MG tablet TAKE AS DIRECTED BY COUMADIN CLINIC Patient taking differently: Take 5-7.5 mg by mouth See admin instructions. 7.5mg  on TUES, 5mg  all other days or TAKE AS DIRECTED BY COUMADIN CLINIC 01/15/18   Deboraha Sprang, MD    Family History Family History  Problem Relation Age of Onset  . Kidney cancer Sister   . Cancer Sister        breast  . Heart attack Mother   . Ulcers Father   . Breast cancer Daughter   . Cancer Daughter        breast  . Heart disease Brother        CABG  . Asthma Brother     Social History Social History   Tobacco Use  . Smoking status: Never Smoker  . Smokeless tobacco: Never Used  Substance Use Topics  . Alcohol use: No    Alcohol/week: 0.0 standard drinks  . Drug use: No     Allergies   Iohexol   Review of Systems Review of Systems  Constitutional: Negative for chills and fever.  HENT: Positive for voice  change ( slurred per son). Negative for ear pain and sore throat.   Eyes: Negative for pain and visual disturbance.  Respiratory: Negative for cough and shortness of breath.   Cardiovascular: Negative for chest pain and palpitations.  Gastrointestinal: Negative for abdominal pain and vomiting.  Genitourinary: Negative for dysuria and hematuria.  Musculoskeletal: Negative for arthralgias and back pain.  Skin: Negative for color change and rash.  Neurological: Positive for facial asymmetry ( R sided facial droop per son). Negative for seizures and syncope.  Psychiatric/Behavioral: Positive for confusion ( per son).  All other systems reviewed and are negative.  Physical Exam Updated Vital Signs BP (!) 141/83   Pulse (!) 51   Temp (!) 97.4 F (36.3 C) (Oral)   Resp (!) 21   Ht 5' (1.524 m)   Wt 60.1 kg   SpO2 100%   BMI 25.88 kg/m   Physical Exam Vitals signs and nursing note reviewed.  Constitutional:      General: She is not in acute distress.    Appearance: Normal appearance. She is well-developed. She is not diaphoretic.  HENT:     Head: Normocephalic and atraumatic.     Right Ear: External ear normal.     Left Ear: External ear normal.     Nose: Nose normal.     Mouth/Throat:     Mouth: Mucous membranes are moist.  Eyes:     Conjunctiva/sclera: Conjunctivae normal.  Neck:     Musculoskeletal: Neck supple. No neck rigidity.  Cardiovascular:     Rate and Rhythm: Bradycardia present. Rhythm irregular.     Pulses:          Radial pulses are 2+ on the right side and 2+ on the left side.       Dorsalis pedis pulses are 2+ on the right side and 2+ on the left side.     Heart sounds: No murmur.  Pulmonary:     Effort: Pulmonary effort is normal. No respiratory distress.     Breath sounds: Normal breath sounds.  Abdominal:     Palpations: Abdomen is soft.     Tenderness: There is no abdominal tenderness.  Musculoskeletal:     Right lower leg: Edema present.      Left lower leg: Edema present.  Skin:    General: Skin is warm and dry.  Neurological:     General: No focal deficit present.     Mental Status: She is alert.     Comments: Cranial nerves II through XII grossly intact.  EOMI.  Patient has no finger dysmetria or pronator drift.  Sensation is intact light touch in all extremities.  Patient is interactive with this examiner and follows commands appropriately.  Alert and oriented x3.  Patient knows she is at most, hospital, the date, and the president but does not know why she is in the hospital.     ED Treatments / Results  Labs (all labs ordered are listed, but only abnormal results are displayed) Labs Reviewed  PROTIME-INR - Abnormal; Notable for the following components:      Result Value   Prothrombin Time 36.9 (*)    All other components within normal limits  APTT - Abnormal; Notable for the following components:   aPTT 41 (*)    All other components within normal limits  CBC - Abnormal; Notable for the following components:   RBC 3.77 (*)    Hemoglobin 10.9 (*)    MCV 102.9 (*)    MCHC 28.1 (*)    Platelets 139 (*)    nRBC 0.4 (*)    All other components within normal limits  DIFFERENTIAL - Abnormal; Notable for the following components:   Lymphs Abs 0.5 (*)    All other components within normal limits  COMPREHENSIVE METABOLIC PANEL - Abnormal; Notable for the following components:   Sodium 146 (*)    Chloride 112 (*)    Glucose, Bld 111 (*)    BUN 27 (*)    GFR calc non Af Amer 54 (*)    All other components within normal limits  ETHANOL  TSH  MAGNESIUM  URINALYSIS, ROUTINE W REFLEX MICROSCOPIC  RAPID URINE DRUG SCREEN, HOSP PERFORMED  I-STAT TROPONIN, ED    EKG EKG Interpretation  Date/Time:  Friday May 30 2018 14:49:56 EST Ventricular Rate:  81 PR Interval:    QRS Duration: 112 QT Interval:  380 QTC Calculation: 436 R Axis:   113 Text Interpretation:  Atrial fibrillation Borderline intraventricular  conduction delay Low voltage, precordial leads Nonspecific T abnrm, anterolateral leads Artifact in lead(s) V2 and baseline wander in lead(s) V2 poor baseline Confirmed by Isla Pence [id number]) on 05/30/2018 3:12:37 PM   Radiology Ct Head Wo Contrast  Result Date: 05/30/2018 CLINICAL DATA:  Altered level of consciousness Altered level of consciousness (LOC), unexplained R facial droop and slurred speech, now resolved EXAM: CT HEAD WITHOUT CONTRAST TECHNIQUE: Contiguous axial images were obtained from the base of the skull through the vertex without intravenous contrast. COMPARISON:  None. FINDINGS: Brain: No acute intracranial hemorrhage. No focal mass lesion. No CT evidence of acute infarction. No midline shift or mass effect. No hydrocephalus. Basilar cisterns are patent. There are periventricular and subcortical white matter hypodensities. Generalized cortical atrophy. Brain: No acute intracranial hemorrhage. No focal mass lesion. No CT evidence of acute infarction. No midline shift or mass effect. No hydrocephalus. Basilar cisterns are patent. Vascular: No hyperdense vessel or unexpected calcification. Skull: Normal. Negative for fracture or focal lesion. Sinuses/Orbits: Coastal thickening in the sphenoid sinuses. Frontal sinuses clear. Mastoid air cells clear. Orbits are clear. Other: None. IMPRESSION: 1. No acute intracranial findings. 2. Atrophy and white matter microvascular disease. Electronically Signed   By: Suzy Bouchard M.D.   On: 05/30/2018 18:36   Mr Brain Wo Contrast  Result Date: 05/30/2018 CLINICAL DATA:  Acute onset of right-sided facial droop and slurred speech beginning 24 hours ago. EXAM: MRI HEAD WITHOUT CONTRAST MRA HEAD WITHOUT CONTRAST TECHNIQUE: Multiplanar, multiecho pulse sequences of the brain and surrounding structures were obtained without intravenous contrast. Angiographic images of the head were obtained using MRA technique without contrast. COMPARISON:  CT head  without contrast 05/30/2018. MRI brain 10/06/2009 FINDINGS: MRI HEAD FINDINGS Brain: Diffusion-weighted images demonstrate no evidence for acute or subacute infarction. Moderate atrophy and diffuse white matter disease is present bilaterally. Dilated perivascular spaces are present throughout the basal ganglia and thalami. White matter changes extend into the brainstem. The cerebellum is unremarkable. The internal auditory canals are within normal limits bilaterally. Vascular: Flow is present in the major intracranial arteries. Skull and upper cervical spine: Prominent pannus formation is present about the dens. This distorts the lower medulla and upper cervical spine. Additional degenerative changes narrow the spinal canal at C2-3. There is fusion across the disc space at C3-4. Sinuses/Orbits: The anterior paranasal sinuses are clear. There is diffuse mucosal thickening throughout the sphenoid sinuses. Right sphenoidal recess is opacified. A polyp or mucous retention cyst is present in the right sphenoid sinus. The right mastoid air cells are clear. No obstructing nasopharyngeal lesion is present. Bilateral lens replacements are present. Globes and orbits are within normal limits otherwise. MRA HEAD FINDINGS The internal carotid arteries are within normal limits from the high cervical segments through the ICA termini. The A1 and M1 segments are normal. Anterior communicating artery is patent. MCA bifurcations are intact bilaterally. ACA and MCA branch vessels are within normal limits. The vertebral arteries are codominant. PICA origins are visualized and normal bilaterally. Basilar artery is normal. The left posterior cerebral artery originates from basilar tip. The right posterior cerebral artery is  fed from a posterior communicating artery and the P1 segment. PCA branch vessels are within normal limits bilaterally IMPRESSION: 1. No acute intracranial abnormality. 2. Moderate diffuse white matter disease likely  reflects the sequela of chronic microvascular ischemia. 3. MRA demonstrates no acute large vessel occlusion. 4. Posterior paranasal sinus disease as described. 5. Right mastoid effusion. No obstructing nasopharyngeal lesion is present. 6. Prominent pannus formation and degenerative changes in the upper cervical spine narrow the spinal canal as described. Electronically Signed   By: San Morelle M.D.   On: 05/30/2018 19:14   Mr Jodene Nam Head Wo Contrast  Result Date: 05/30/2018 CLINICAL DATA:  Acute onset of right-sided facial droop and slurred speech beginning 24 hours ago. EXAM: MRI HEAD WITHOUT CONTRAST MRA HEAD WITHOUT CONTRAST TECHNIQUE: Multiplanar, multiecho pulse sequences of the brain and surrounding structures were obtained without intravenous contrast. Angiographic images of the head were obtained using MRA technique without contrast. COMPARISON:  CT head without contrast 05/30/2018. MRI brain 10/06/2009 FINDINGS: MRI HEAD FINDINGS Brain: Diffusion-weighted images demonstrate no evidence for acute or subacute infarction. Moderate atrophy and diffuse white matter disease is present bilaterally. Dilated perivascular spaces are present throughout the basal ganglia and thalami. White matter changes extend into the brainstem. The cerebellum is unremarkable. The internal auditory canals are within normal limits bilaterally. Vascular: Flow is present in the major intracranial arteries. Skull and upper cervical spine: Prominent pannus formation is present about the dens. This distorts the lower medulla and upper cervical spine. Additional degenerative changes narrow the spinal canal at C2-3. There is fusion across the disc space at C3-4. Sinuses/Orbits: The anterior paranasal sinuses are clear. There is diffuse mucosal thickening throughout the sphenoid sinuses. Right sphenoidal recess is opacified. A polyp or mucous retention cyst is present in the right sphenoid sinus. The right mastoid air cells are  clear. No obstructing nasopharyngeal lesion is present. Bilateral lens replacements are present. Globes and orbits are within normal limits otherwise. MRA HEAD FINDINGS The internal carotid arteries are within normal limits from the high cervical segments through the ICA termini. The A1 and M1 segments are normal. Anterior communicating artery is patent. MCA bifurcations are intact bilaterally. ACA and MCA branch vessels are within normal limits. The vertebral arteries are codominant. PICA origins are visualized and normal bilaterally. Basilar artery is normal. The left posterior cerebral artery originates from basilar tip. The right posterior cerebral artery is fed from a posterior communicating artery and the P1 segment. PCA branch vessels are within normal limits bilaterally IMPRESSION: 1. No acute intracranial abnormality. 2. Moderate diffuse white matter disease likely reflects the sequela of chronic microvascular ischemia. 3. MRA demonstrates no acute large vessel occlusion. 4. Posterior paranasal sinus disease as described. 5. Right mastoid effusion. No obstructing nasopharyngeal lesion is present. 6. Prominent pannus formation and degenerative changes in the upper cervical spine narrow the spinal canal as described. Electronically Signed   By: San Morelle M.D.   On: 05/30/2018 19:14    Procedures Procedures (including critical care time)  Medications Ordered in ED Medications  lactated ringers bolus 500 mL (has no administration in time range)  LORazepam (ATIVAN) injection 0.5 mg (0.5 mg Intravenous Given 05/30/18 1827)     Initial Impression / Assessment and Plan / ED Course  I have reviewed the triage vital signs and the nursing notes.  Pertinent labs & imaging results that were available during my care of the patient were reviewed by me and considered in my medical decision making (see chart  for details).      Patient is a 84 year old female who presents with above-stated  history and exam for evaluation of right-sided facial droop associated with slurred speech and confusion with a last known normal approximately 6:30 PM on 12/19.  On presentation patient is noted to have a pulse rate of 51, respiratory rate of 24, with otherwise stable vital signs.  Exam as above remarkable for no focal neurological deficits although the patient is not oriented to year.  Per the patient's son this is not normal for her and she is normally oriented to year.  Patient son also states that he feels she has gotten significantly better since he first noticed her symptoms onset at approximately 130 to her arrival to emergency department approximately 3 PM.  He denies prior similar episodes or strokes.  EKG remarkable for A. fib with no signs of acute ischemia.  Doubt ACS as patient denies any chest pain or shortness of breath and initial troponin is 0.01.  CBC remarkable for hemoglobin 10.9 which appears consistent with the prior insurance prior levels over the past several months.  WBC is 5.2 and platelets are 139.  Patient's INR is noted to be supratherapeutic at 3.8.  CMP remarkable for NA 146, K4.2, CL 112, glucose 111, creatinine 0.93, unremarkable LFTs, AG 8.  History exam is not consistent with sepsis, meningitis, hypoxia, or acute traumatic injury.  Aside from elevated sodium no other significant electrolyte or metabolic abnormalities are noted.  No hypoglycemia.  History exam is not consistent with sepsis or meningitis.  CT head without remarkable for "no acute intracranial findings.  Atrophy and white matter macrovascular disease."  Given concern for TIA and MR as well as an MRA head was ordered.  In addition the patient was given a bolus of IV fluids in the ED given report of poor p.o. intake earlier today and mild elevated sodium.  CT showed no evidence of acute hemorrhagic stroke.  In addition patient's MRA head and MR brain without showed no acute intracranial abnormality.  Patient  discharged in stable condition.  Strict return precautions advised and discussed.  Importance of close outpatient follow-up with her PCP was emphasized.  Patient and patient's son voiced understanding of this.   Final Clinical Impressions(s) / ED Diagnoses   Final diagnoses:  Weakness  Facial droop    ED Discharge Orders    None       Hulan Saas, MD 05/30/18 [id number]    Isla Pence, MD 06/02/18 [phone]

## 2018-05-30 NOTE — ED Notes (Signed)
Pt in MRI at this time 

## 2018-06-02 NOTE — Telephone Encounter (Signed)
Copied from [place] [phone]. Topic: General - Inquiry >> Jun 02, 2018 10:27 AM Rutherford Nail, NT wrote: Reason for CRM: Debbie with [facility] calling and would like to know if Dr Sharlet Salina would be the signing physician on the patient's death certificate? Please advise.  CB#: [phone]

## 2018-06-02 NOTE — Telephone Encounter (Signed)
Cheyenne [name] informed of MD response

## 2018-06-02 NOTE — Telephone Encounter (Signed)
Fine

## 2018-06-03 ENCOUNTER — Telehealth: Payer: Self-pay

## 2018-06-03 NOTE — Telephone Encounter (Signed)
Copied from [place] [id number]. Topic: General - Other >> Jun 03, 2018 11:08 AM Windy Kalata wrote: Reason for CRM: Beth from [facility] called and states that the patient passed away on Saturday/Sunday early morning, the PT called to go see her and found out she had passed away. They will send discharge for her passing  Best call back is [phone]

## 2018-06-05 NOTE — Telephone Encounter (Signed)
Paperwork received but Dr. Etter Sjogren only seen patient for one acute visit [fall]; she is Dr. Pricilla Holm at [facility] patient, Dr. Franne Forts patient prior to his retirement from [facility]. Faxed both sets of paperwork sent from [facility] to PCP/SLS

## 2018-06-10 ENCOUNTER — Telehealth: Payer: Self-pay

## 2018-06-10 NOTE — Telephone Encounter (Signed)
On 06/10/18 I received a dc from [facility] (original). DC is for burial. Patient is a patient of Pricilla Holm.  DC will be taken to [facility] [place] for signature.  On 06/13/2018 I received the dc back from Doctor Sharlet Salina. I got the dc ready and called the funeral home to let them know the dc is ready for pickup.

## 2018-06-11 DEATH — deceased

## 2018-07-11 ENCOUNTER — Other Ambulatory Visit: Payer: Self-pay | Admitting: Pulmonary Disease

## 2018-07-23 ENCOUNTER — Telehealth: Payer: Self-pay | Admitting: Internal Medicine

## 2018-07-23 NOTE — Telephone Encounter (Signed)
Copied from [place] [phone]. Topic: Quick Communication - See Telephone Encounter >> Jul 23, 2018  4:16 PM Sheran Luz wrote: CRM for notification. See Telephone encounter for: 07/23/18.   Cliff from [employer] requesting a call back regarding  Pro b natriuretic peptide lab from 03-21-18, as it was denied by insurance and they need a diagnosis associated.   CB# [phone] SID#[id number]

## 2018-07-24 NOTE — Telephone Encounter (Signed)
Called and informed that this was not our office it was pulmonary and gave the number to get ahold of that office in regard to these labs

## 2018-07-24 NOTE — Telephone Encounter (Signed)
I am not seeing where we did this lab did not know if I was missing something

## 2018-07-24 NOTE — Telephone Encounter (Signed)
We did not order this. They should contact ordering physician or hospice if this was done through hospice.

## 2018-11-06 ENCOUNTER — Ambulatory Visit: Payer: Medicare Other | Admitting: Internal Medicine

## 2019-04-01 IMAGING — CT CT HEAD W/O CM
3 series · 15 of 47 positions shown, 18 images · non-contrast
Comparison: None.

CLINICAL DATA: Altered level of consciousness Altered level of
consciousness (LOC), unexplained R facial droop and slurred speech,
now resolved

EXAM:
CT HEAD WITHOUT CONTRAST
TECHNIQUE: Contiguous axial images were obtained from the base of the skull
through the vertex without intravenous contrast.

[Series 3: head 5.0 h30s · axial · 0.43mm/px · z∈[-152,-22]mm · 9 of 32 slices shown, 12 images]
[im 3/32  brain]
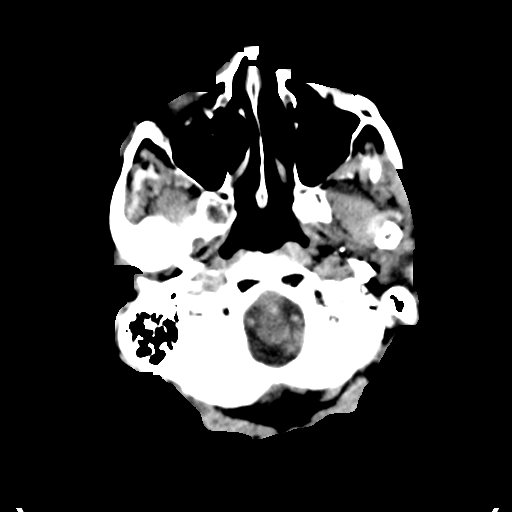
[im 3/32  bone]
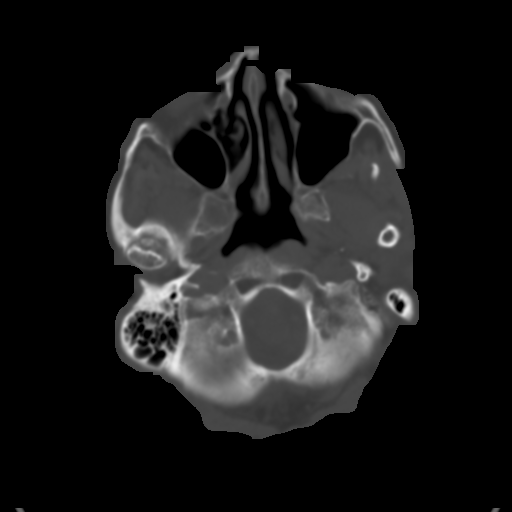
[im 6/32  brain]
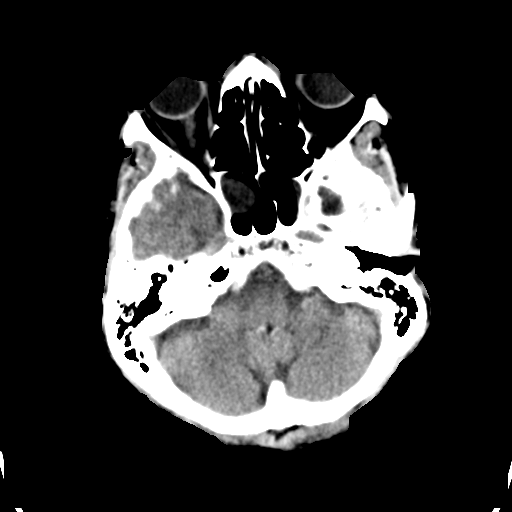
[im 9/32  brain]
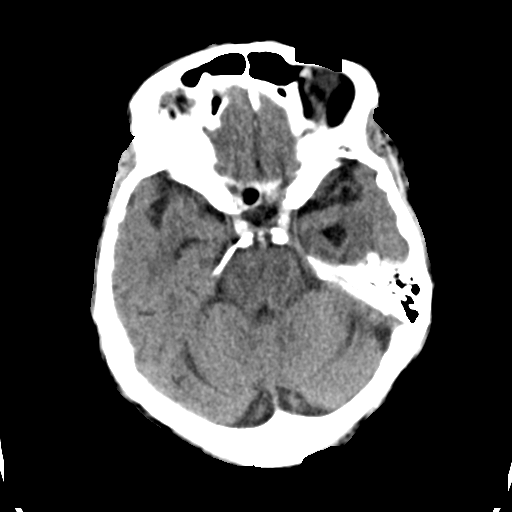
[im 12/32  brain]
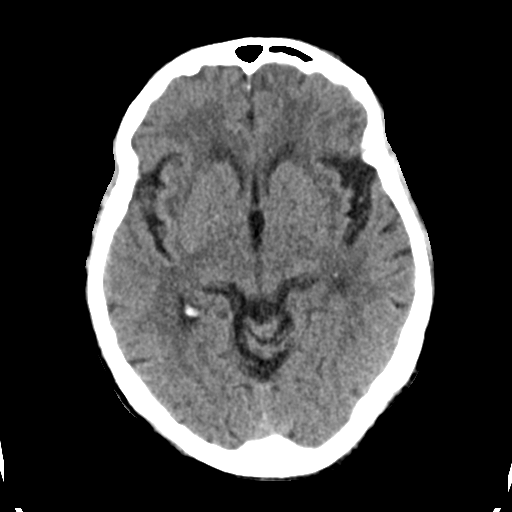
[im 17/32  brain]
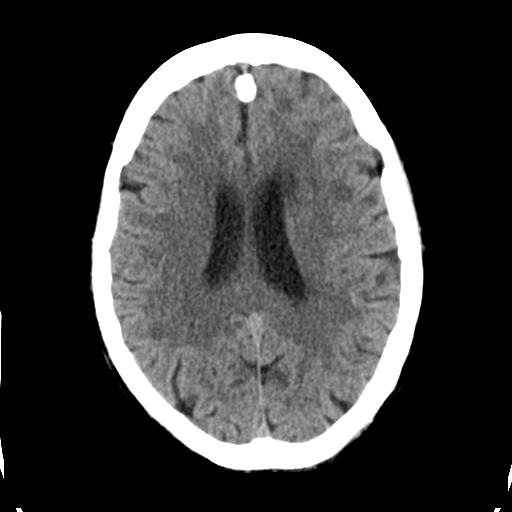
[im 17/32  bone]
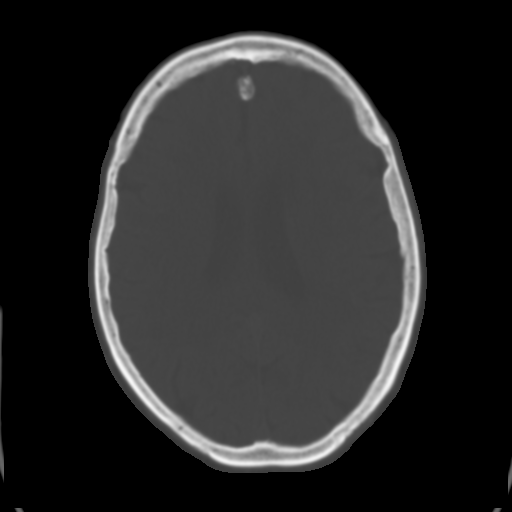
[im 20/32  brain]
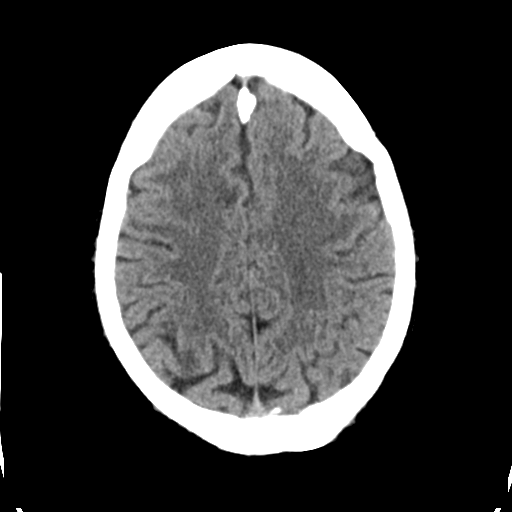
[im 23/32  brain]
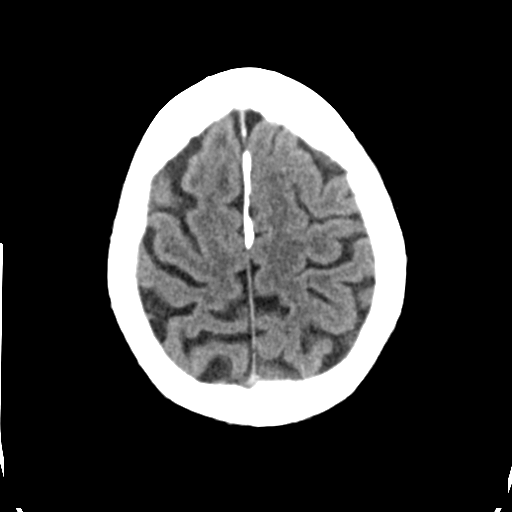
[im 26/32  brain]
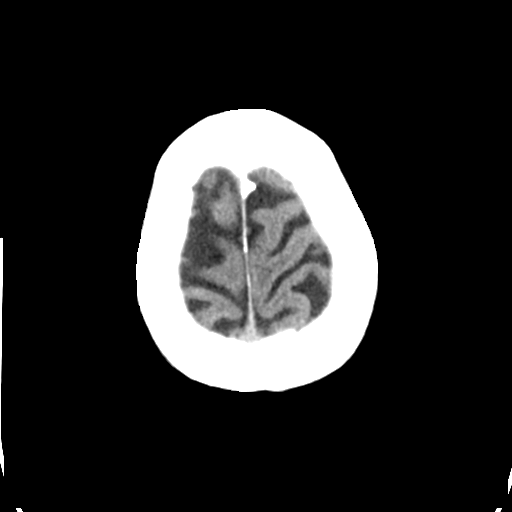
[im 29/32  brain]
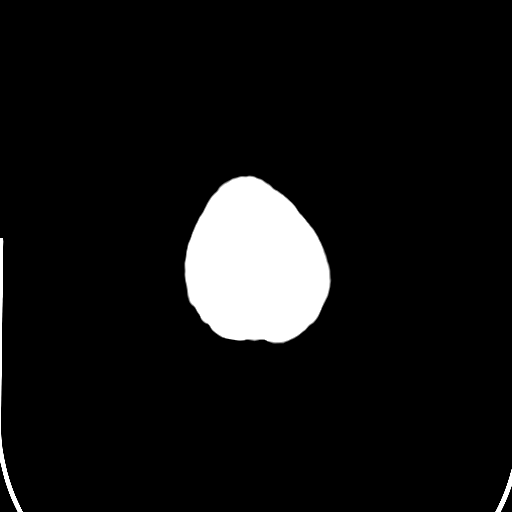
[im 29/32  bone]
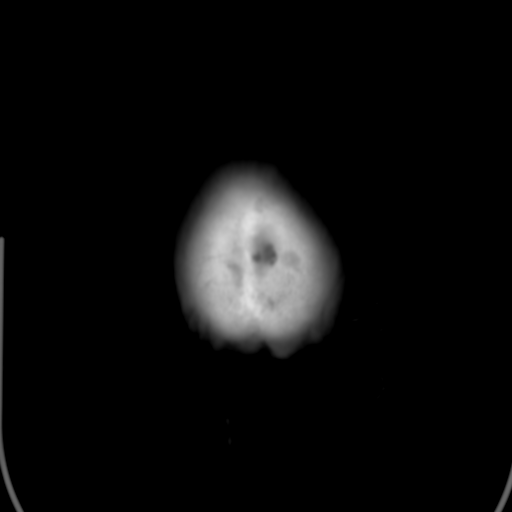

[Series 5: head 3.0 mpr cor · coronal · 0.31mm/px · 3 of 72 slices shown]
[im 24/72  brain]
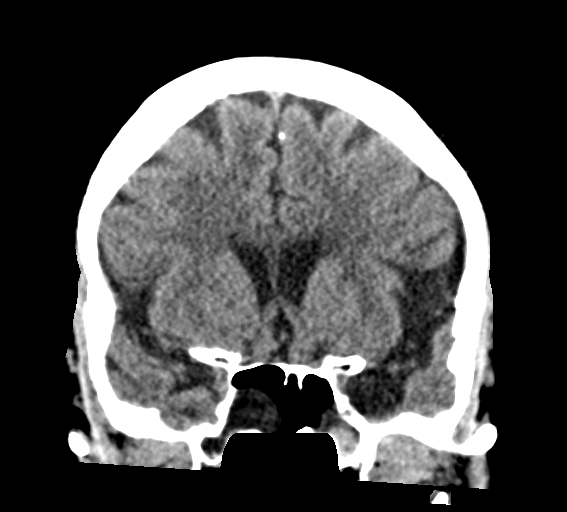
[im 32/72  brain]
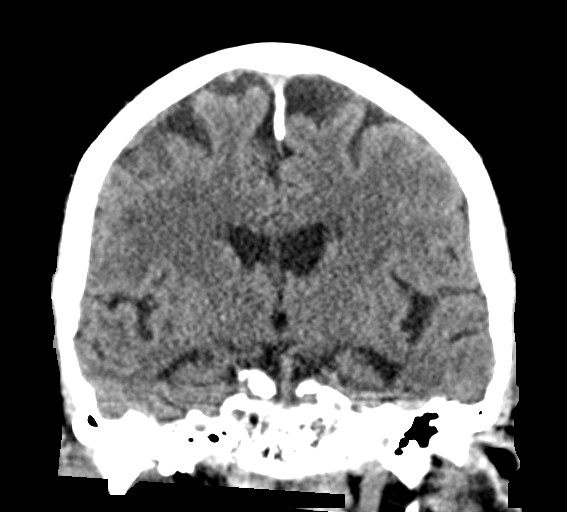
[im 40/72  brain]
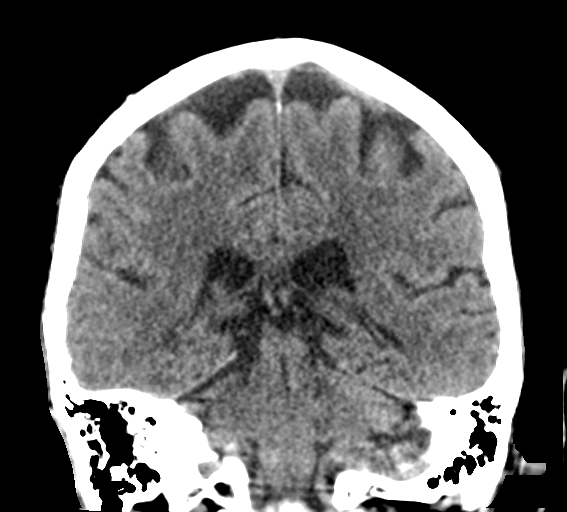

[Series 6: head 3.0 mpr sag · sagittal · 0.31mm/px · 3 of 57 slices shown]
[im 19/57  brain]
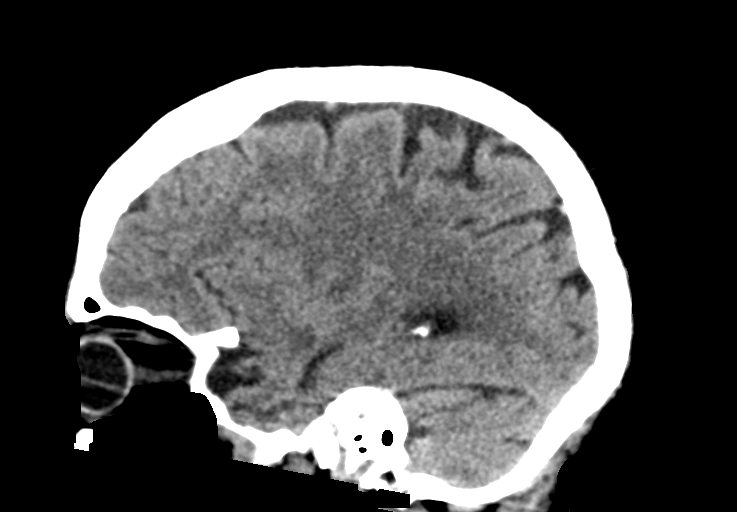
[im 29/57  brain]
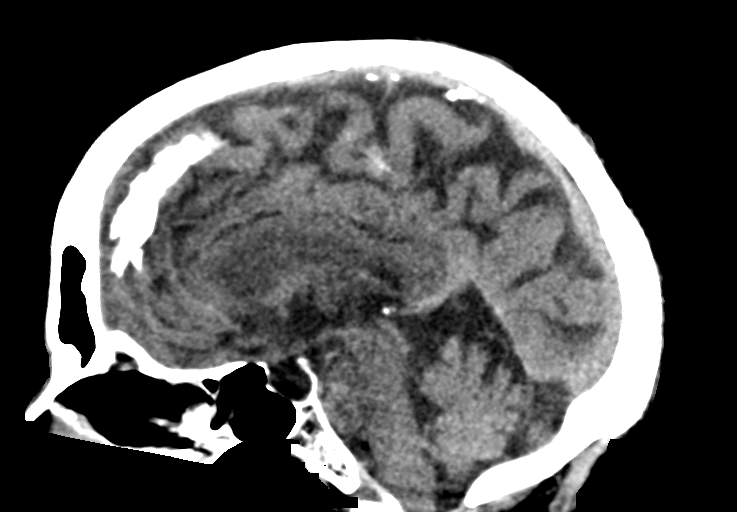
[im 38/57  brain]
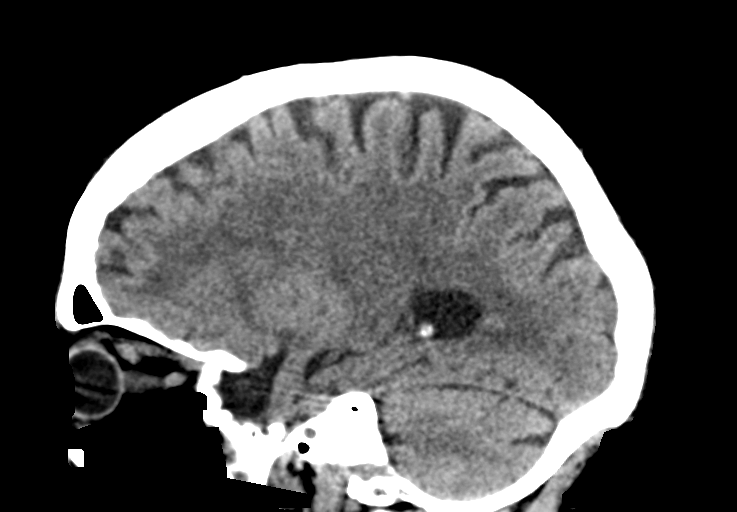

[15 of 47 positions shown; findings below may reference images not displayed]

FINDINGS: Brain: No acute intracranial hemorrhage. No focal mass lesion. No CT
evidence of acute infarction. No midline shift or mass effect. No
hydrocephalus. Basilar cisterns are patent.

There are periventricular and subcortical white matter
hypodensities. Generalized cortical atrophy.

Brain: No acute intracranial hemorrhage. No focal mass lesion. No CT
evidence of acute infarction. No midline shift or mass effect. No
hydrocephalus. Basilar cisterns are patent.

Vascular: No hyperdense vessel or unexpected calcification.

Skull: Normal. Negative for fracture or focal lesion.

Sinuses/Orbits: Coastal thickening in the sphenoid sinuses. Frontal
sinuses clear. Mastoid air cells clear. Orbits are clear.

Other: None.
IMPRESSION: 1. No acute intracranial findings.
2. Atrophy and white matter microvascular disease.

## 2019-04-01 IMAGING — MR MR MRA HEAD W/O CM
9 of 11 series · 31 of 48 positions shown · non-contrast
Comparison: CT head without contrast 05/30/2018. MRI brain
10/06/2009

CLINICAL DATA: Acute onset of right-sided facial droop and slurred
speech beginning 24 hours ago.

EXAM:
MRI HEAD WITHOUT CONTRAST
MRA HEAD WITHOUT CONTRAST
TECHNIQUE: Multiplanar, multiecho pulse sequences of the brain and surrounding
structures were obtained without intravenous contrast. Angiographic
images of the head were obtained using MRA technique without
contrast.

[Series 5: DWI · axial · 3.0mm · 0.94mm/px · z∈[-106,+34]mm · 6 of 104 slices shown (1 of 2)]
[im 1/104]
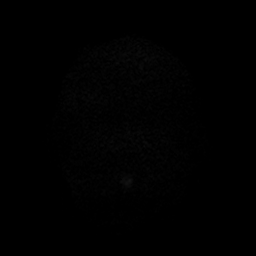
[im 21/104]
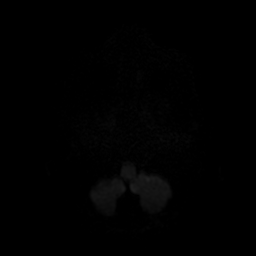
[im 42/104]
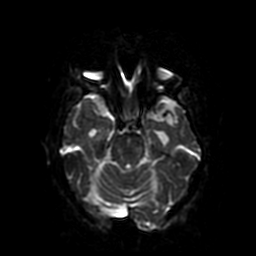
[im 62/104]
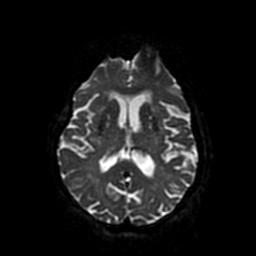
[im 83/104]
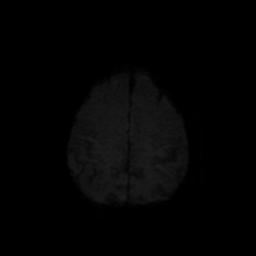
[im 104/104]
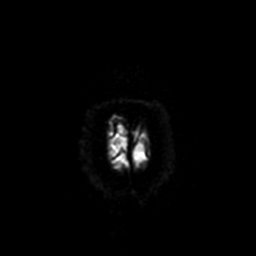

[Series 7: ax (id) 2 · axial · 1.0mm · 0.43mm/px · z∈[-87,-26]mm · 6 of 184 slices shown]
[im 1/184]
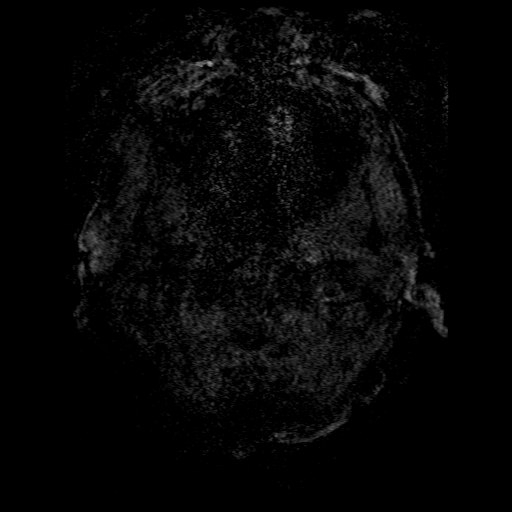
[im 34/184]
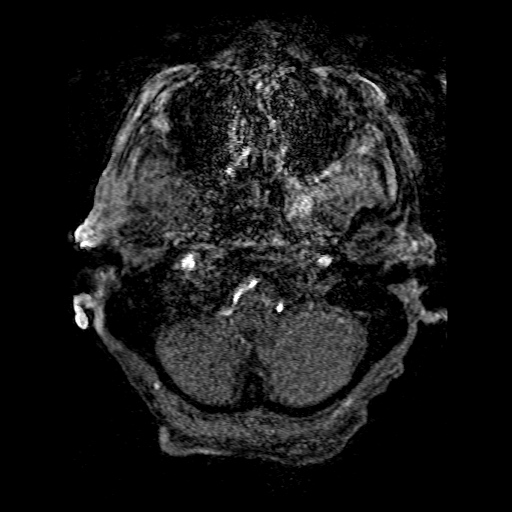
[im 50/184]
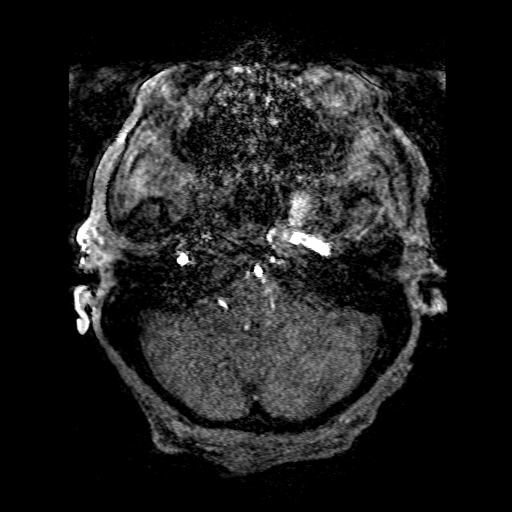
[im 84/184]
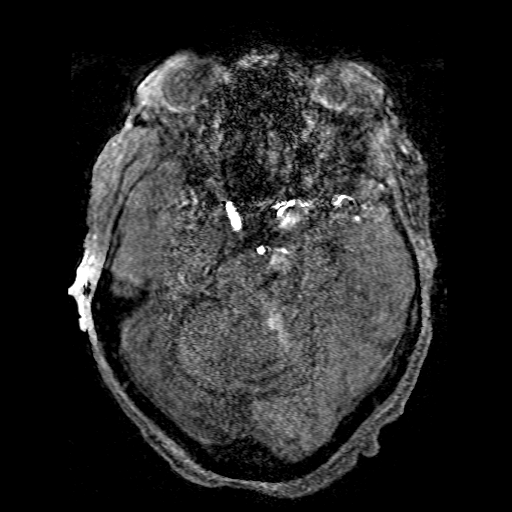
[im 100/184]
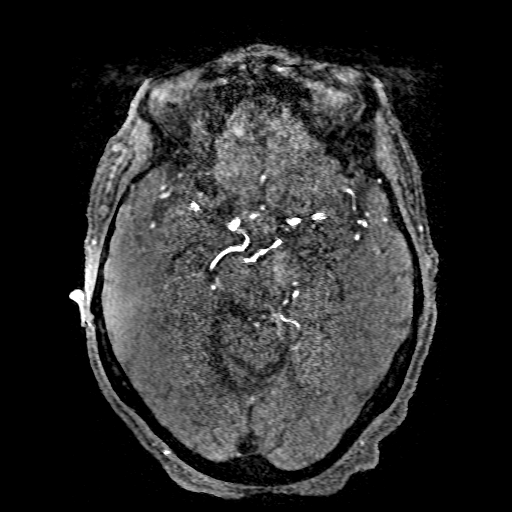
[im 134/184]
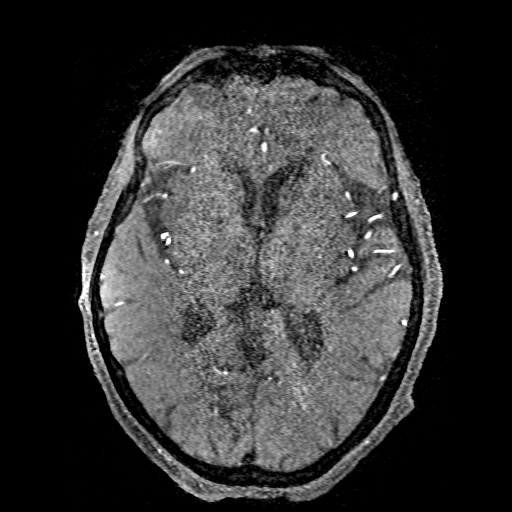

[Series 8: FLAIR · axial · 3.0mm · 0.94mm/px · z∈[-105,+33]mm · 2 of 26 slices shown (1 of 2)]
[im 1/26]
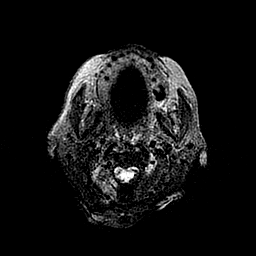
[im 26/26]
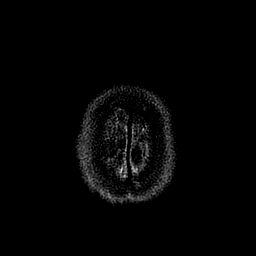

[Series 10: T2 · axial · 5.0mm · 0.47mm/px · z∈[-105,+33]mm · 2 of 26 slices shown (1 of 2)]
[im 1/26]
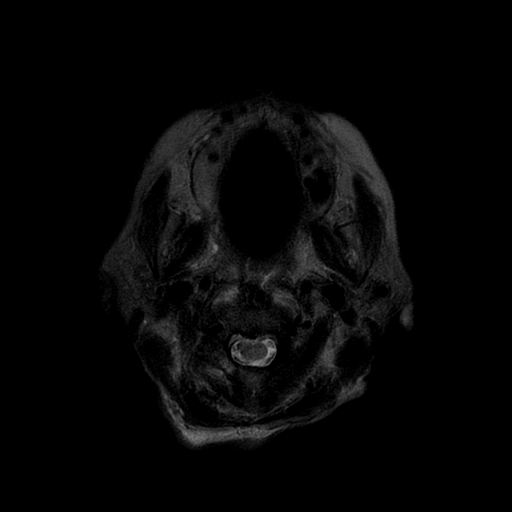
[im 26/26]
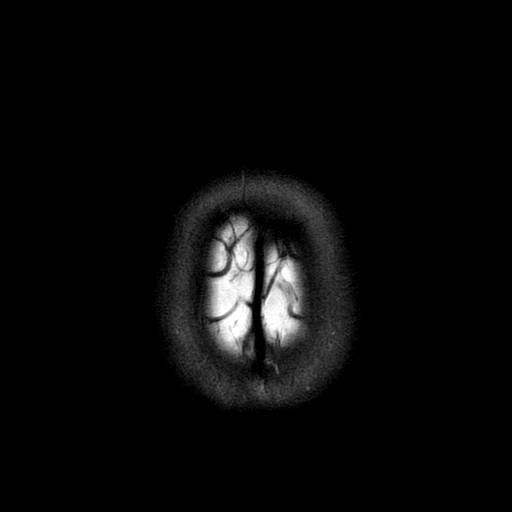

[Series 12: DWI · coronal · 4.0mm · 0.94mm/px · 5 of 69 slices shown (2 of 2)]
[im 1/69]
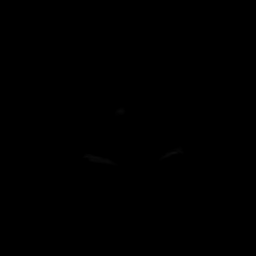
[im 18/69]
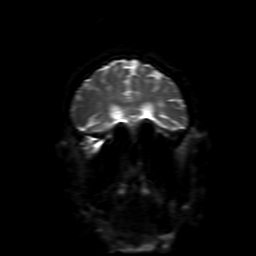
[im 35/69]
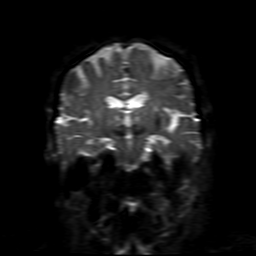
[im 52/69]
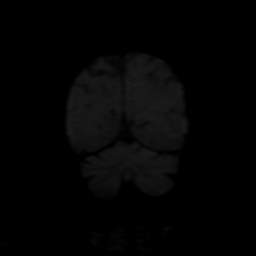
[im 69/69]
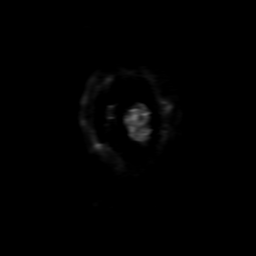

[Series 13: FLAIR · sagittal · 5.0mm · 0.47mm/px · 2 of 23 slices shown (2 of 2)]
[im 1/23]
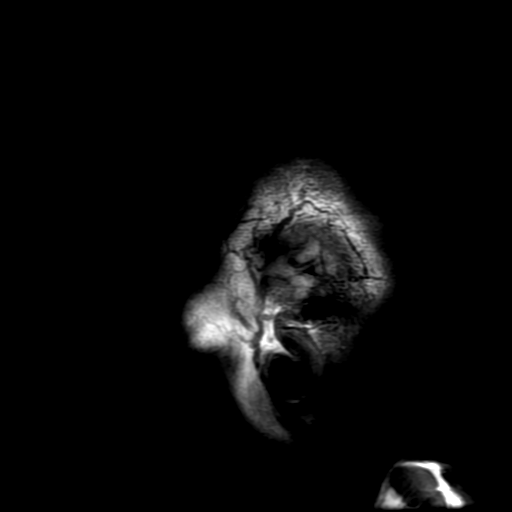
[im 23/23]
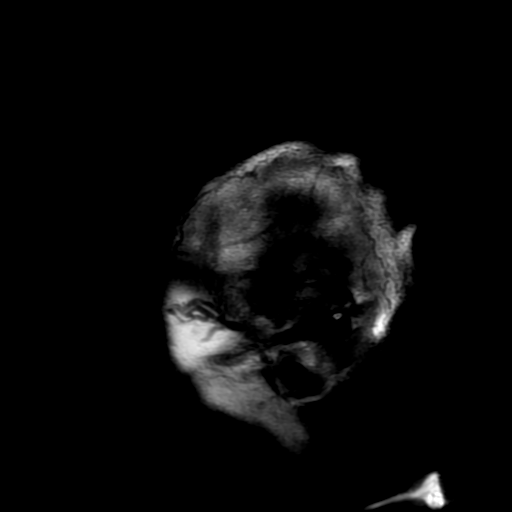

[Series 15: T2 · coronal · 4.0mm · 0.43mm/px · 2 of 35 slices shown (2 of 2)]
[im 1/35]
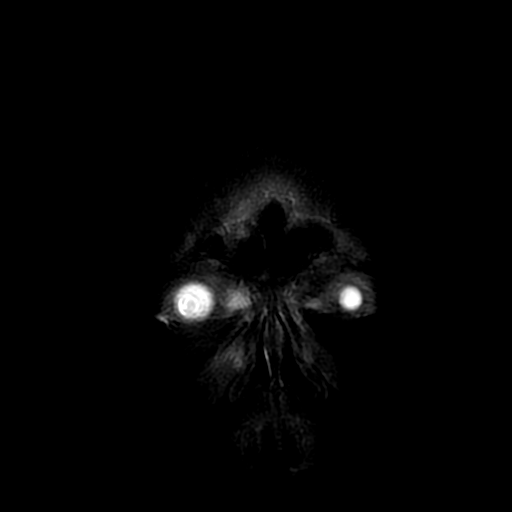
[im 35/35]
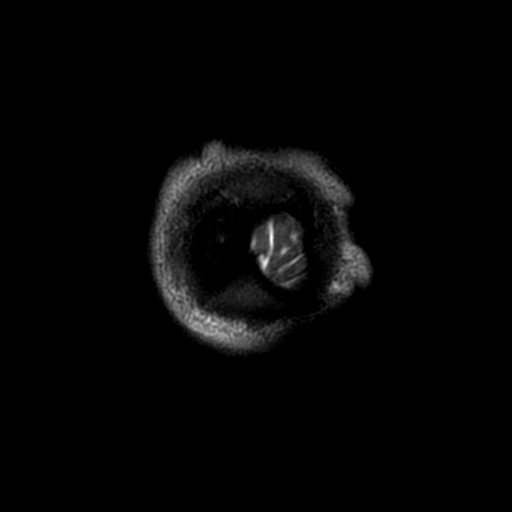

[Series 550: ADC · axial · 3.0mm · 0.94mm/px · z∈[-106,+34]mm · 4 of 52 slices shown (1 of 2)]
[im 1/52]
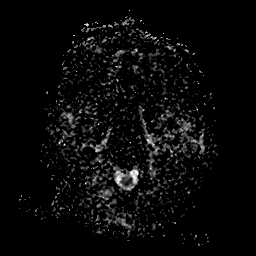
[im 18/52]
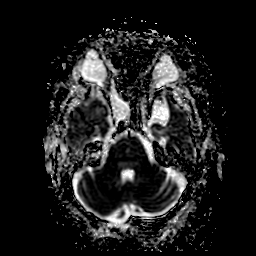
[im 35/52]
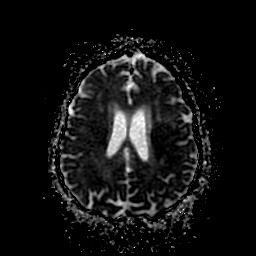
[im 52/52]
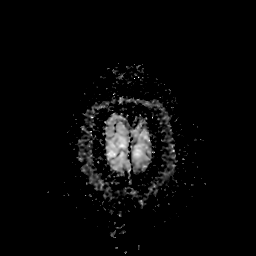

[Series 1250: ADC · coronal · 4.0mm · 0.94mm/px · 2 of 35 slices shown (2 of 2)]
[im 1/35]
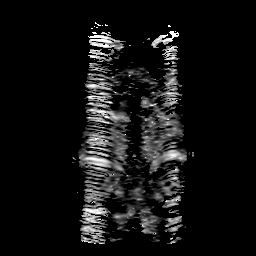
[im 35/35]
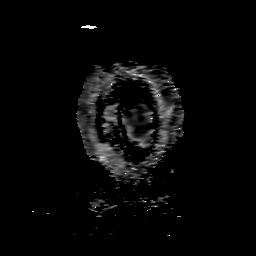

[31 of 48 positions shown; findings below may reference images not displayed]

FINDINGS: MRI HEAD FINDINGS

Brain: Diffusion-weighted images demonstrate no evidence for acute
or subacute infarction. Moderate atrophy and diffuse white matter
disease is present bilaterally. Dilated perivascular spaces are
present throughout the basal ganglia and thalami. White matter
changes extend into the brainstem. The cerebellum is unremarkable.
The internal auditory canals are within normal limits bilaterally.

Vascular: Flow is present in the major intracranial arteries.

Skull and upper cervical spine: Prominent pannus formation is
present about the dens. This distorts the lower medulla and upper
cervical spine. Additional degenerative changes narrow the spinal
canal at C2-3. There is fusion across the disc space at C3-4.

Sinuses/Orbits: The anterior paranasal sinuses are clear. There is
diffuse mucosal thickening throughout the sphenoid sinuses. Right
sphenoidal recess is opacified. A polyp or mucous retention cyst is
present in the right sphenoid sinus. The right mastoid air cells are
clear. No obstructing nasopharyngeal lesion is present. Bilateral
lens replacements are present. Globes and orbits are within normal
limits otherwise.

MRA HEAD FINDINGS

The internal carotid arteries are within normal limits from the high
cervical segments through the ICA termini. The A1 and M1 segments
are normal. Anterior communicating artery is patent. MCA
bifurcations are intact bilaterally. ACA and MCA branch vessels are
within normal limits.

The vertebral arteries are codominant. PICA origins are visualized
and normal bilaterally. Basilar artery is normal. The left posterior
cerebral artery originates from basilar tip. The right posterior
cerebral artery is fed from a posterior communicating artery and the
P1 segment. PCA branch vessels are within normal limits bilaterally
IMPRESSION: 1. No acute intracranial abnormality.
2. Moderate diffuse white matter disease likely reflects the sequela
of chronic microvascular ischemia.
3. MRA demonstrates no acute large vessel occlusion.
4. Posterior paranasal sinus disease as described.
5. Right mastoid effusion. No obstructing nasopharyngeal lesion is
present.
6. Prominent pannus formation and degenerative changes in the upper
cervical spine narrow the spinal canal as described.
